# Patient Record
Sex: Female | Born: 1980 | Race: White | Hispanic: No | Marital: Married | Smoking: Former smoker
Health system: Southern US, Community
[De-identification: ages and names within clinical notes are randomized; demographics above are authoritative.]

## PROBLEM LIST (undated history)

## (undated) DIAGNOSIS — G4730 Sleep apnea, unspecified: Secondary | ICD-10-CM

## (undated) DIAGNOSIS — G4700 Insomnia, unspecified: Secondary | ICD-10-CM

## (undated) DIAGNOSIS — L709 Acne, unspecified: Secondary | ICD-10-CM

## (undated) DIAGNOSIS — K5909 Other constipation: Secondary | ICD-10-CM

## (undated) DIAGNOSIS — J189 Pneumonia, unspecified organism: Secondary | ICD-10-CM

## (undated) DIAGNOSIS — R748 Abnormal levels of other serum enzymes: Secondary | ICD-10-CM

## (undated) DIAGNOSIS — E538 Deficiency of other specified B group vitamins: Secondary | ICD-10-CM

## (undated) DIAGNOSIS — G4733 Obstructive sleep apnea (adult) (pediatric): Secondary | ICD-10-CM

## (undated) DIAGNOSIS — N3281 Overactive bladder: Secondary | ICD-10-CM

## (undated) DIAGNOSIS — M25519 Pain in unspecified shoulder: Secondary | ICD-10-CM

## (undated) DIAGNOSIS — M7071 Other bursitis of hip, right hip: Secondary | ICD-10-CM

## (undated) DIAGNOSIS — G90A Postural orthostatic tachycardia syndrome (POTS): Secondary | ICD-10-CM

## (undated) DIAGNOSIS — R51 Headache: Secondary | ICD-10-CM

## (undated) DIAGNOSIS — G44009 Cluster headache syndrome, unspecified, not intractable: Secondary | ICD-10-CM

## (undated) DIAGNOSIS — K219 Gastro-esophageal reflux disease without esophagitis: Secondary | ICD-10-CM

## (undated) DIAGNOSIS — F419 Anxiety disorder, unspecified: Secondary | ICD-10-CM

## (undated) DIAGNOSIS — G43909 Migraine, unspecified, not intractable, without status migrainosus: Secondary | ICD-10-CM

## (undated) DIAGNOSIS — L309 Dermatitis, unspecified: Secondary | ICD-10-CM

## (undated) DIAGNOSIS — M25562 Pain in left knee: Secondary | ICD-10-CM

## (undated) DIAGNOSIS — K1379 Other lesions of oral mucosa: Secondary | ICD-10-CM

## (undated) DIAGNOSIS — F32A Depression, unspecified: Secondary | ICD-10-CM

## (undated) DIAGNOSIS — E669 Obesity, unspecified: Secondary | ICD-10-CM

## (undated) DIAGNOSIS — M1990 Unspecified osteoarthritis, unspecified site: Secondary | ICD-10-CM

## (undated) DIAGNOSIS — R519 Headache, unspecified: Secondary | ICD-10-CM

## (undated) DIAGNOSIS — E559 Vitamin D deficiency, unspecified: Secondary | ICD-10-CM

## (undated) HISTORY — DX: Headache: R51

## (undated) HISTORY — DX: Pain in left knee: M25.562

## (undated) HISTORY — PX: INTRAUTERINE DEVICE (IUD) INSERTION: SHX5877

## (undated) HISTORY — DX: Insomnia, unspecified: G47.00

## (undated) HISTORY — DX: Cluster headache syndrome, unspecified, not intractable: G44.009

## (undated) HISTORY — DX: Postural orthostatic tachycardia syndrome (POTS): G90.A

## (undated) HISTORY — DX: Other bursitis of hip, right hip: M70.71

## (undated) HISTORY — DX: Obstructive sleep apnea (adult) (pediatric): G47.33

## (undated) HISTORY — PX: HEMORRHOID SURGERY: SHX153

## (undated) HISTORY — DX: Pain in unspecified shoulder: M25.519

## (undated) HISTORY — PX: DIAGNOSTIC LAPAROSCOPY WITH REMOVAL OF ECTOPIC PREGNANCY: SHX6449

## (undated) HISTORY — DX: Migraine, unspecified, not intractable, without status migrainosus: G43.909

## (undated) HISTORY — PX: LASIK: SHX215

## (undated) HISTORY — DX: Overactive bladder: N32.81

## (undated) HISTORY — DX: Deficiency of other specified B group vitamins: E53.8

## (undated) HISTORY — PX: DUPUYTREN CONTRACTURE RELEASE: SHX1478

## (undated) HISTORY — PX: DILATION AND CURETTAGE OF UTERUS: SHX78

## (undated) HISTORY — DX: Headache, unspecified: R51.9

## (undated) HISTORY — PX: ECTOPIC PREGNANCY SURGERY: SHX613

## (undated) HISTORY — PX: TUBAL LIGATION: SHX77

## (undated) HISTORY — DX: Other constipation: K59.09

## (undated) HISTORY — DX: Dermatitis, unspecified: L30.9

## (undated) HISTORY — DX: Depression, unspecified: F32.A

## (undated) HISTORY — DX: Unspecified osteoarthritis, unspecified site: M19.90

## (undated) HISTORY — DX: Acne, unspecified: L70.9

## (undated) HISTORY — DX: Obesity, unspecified: E66.9

## (undated) HISTORY — DX: Other lesions of oral mucosa: K13.79

## (undated) HISTORY — DX: Vitamin D deficiency, unspecified: E55.9

---

## 2000-11-07 HISTORY — PX: GALLBLADDER SURGERY: SHX652

## 2001-11-07 HISTORY — PX: CHOLECYSTECTOMY: SHX55

## 2004-08-16 ENCOUNTER — Inpatient Hospital Stay: Payer: Self-pay

## 2005-03-02 ENCOUNTER — Ambulatory Visit: Payer: Self-pay | Admitting: Internal Medicine

## 2006-09-12 ENCOUNTER — Ambulatory Visit: Payer: Self-pay | Admitting: Surgery

## 2006-12-14 ENCOUNTER — Ambulatory Visit: Payer: Self-pay | Admitting: Internal Medicine

## 2006-12-15 ENCOUNTER — Ambulatory Visit: Payer: Self-pay | Admitting: Internal Medicine

## 2007-07-16 ENCOUNTER — Inpatient Hospital Stay: Payer: Self-pay | Admitting: Vascular Surgery

## 2008-06-04 ENCOUNTER — Ambulatory Visit: Payer: Self-pay | Admitting: Obstetrics and Gynecology

## 2008-06-10 ENCOUNTER — Ambulatory Visit: Payer: Self-pay | Admitting: Obstetrics and Gynecology

## 2008-07-04 ENCOUNTER — Ambulatory Visit: Payer: Self-pay | Admitting: Obstetrics and Gynecology

## 2008-07-09 DIAGNOSIS — Z720 Tobacco use: Secondary | ICD-10-CM | POA: Insufficient documentation

## 2008-11-12 DIAGNOSIS — F419 Anxiety disorder, unspecified: Secondary | ICD-10-CM

## 2008-11-12 DIAGNOSIS — G4700 Insomnia, unspecified: Secondary | ICD-10-CM

## 2009-07-24 ENCOUNTER — Ambulatory Visit: Payer: Self-pay | Admitting: General Surgery

## 2009-07-31 ENCOUNTER — Ambulatory Visit: Payer: Self-pay | Admitting: General Surgery

## 2009-10-17 ENCOUNTER — Inpatient Hospital Stay: Payer: Self-pay | Admitting: Psychiatry

## 2009-11-25 ENCOUNTER — Ambulatory Visit: Payer: Self-pay | Admitting: Unknown Physician Specialty

## 2009-11-26 ENCOUNTER — Ambulatory Visit: Payer: Self-pay | Admitting: Unknown Physician Specialty

## 2011-04-18 ENCOUNTER — Ambulatory Visit: Payer: Self-pay | Admitting: Family Medicine

## 2013-04-02 DIAGNOSIS — Z86018 Personal history of other benign neoplasm: Secondary | ICD-10-CM

## 2013-04-02 HISTORY — DX: Personal history of other benign neoplasm: Z86.018

## 2013-06-17 ENCOUNTER — Ambulatory Visit: Payer: Self-pay | Admitting: Obstetrics & Gynecology

## 2013-06-17 LAB — CBC
MCH: 29.7 pg (ref 26.0–34.0)
MCHC: 34.6 g/dL (ref 32.0–36.0)
WBC: 8.3 10*3/uL (ref 3.6–11.0)

## 2013-06-17 LAB — HCG, QUANTITATIVE, PREGNANCY: Beta Hcg, Quant.: 1025 m[IU]/mL — ABNORMAL HIGH

## 2013-06-19 LAB — PATHOLOGY REPORT

## 2013-08-09 ENCOUNTER — Ambulatory Visit: Payer: Self-pay | Admitting: Specialist

## 2013-09-20 ENCOUNTER — Ambulatory Visit: Payer: Self-pay | Admitting: Obstetrics and Gynecology

## 2013-11-07 ENCOUNTER — Other Ambulatory Visit: Payer: Self-pay | Admitting: Obstetrics and Gynecology

## 2013-11-07 LAB — HCG, QUANTITATIVE, PREGNANCY: Beta Hcg, Quant.: 319 m[IU]/mL — ABNORMAL HIGH

## 2013-11-21 ENCOUNTER — Encounter: Payer: Self-pay | Admitting: Maternal & Fetal Medicine

## 2013-12-02 ENCOUNTER — Encounter: Payer: Self-pay | Admitting: Maternal & Fetal Medicine

## 2013-12-02 LAB — COMPREHENSIVE METABOLIC PANEL
ANION GAP: 5 — AB (ref 7–16)
AST: 21 U/L (ref 15–37)
Albumin: 3.4 g/dL (ref 3.4–5.0)
Alkaline Phosphatase: 90 U/L
BILIRUBIN TOTAL: 0.3 mg/dL (ref 0.2–1.0)
BUN: 7 mg/dL (ref 7–18)
CO2: 24 mmol/L (ref 21–32)
Calcium, Total: 8.8 mg/dL (ref 8.5–10.1)
Chloride: 107 mmol/L (ref 98–107)
Creatinine: 0.57 mg/dL — ABNORMAL LOW (ref 0.60–1.30)
EGFR (African American): >60
EGFR (Non-African Amer.): >60
Glucose: 80 mg/dL (ref 65–99)
OSMOLALITY: 269 (ref 275–301)
POTASSIUM: 3.5 mmol/L (ref 3.5–5.1)
SGPT (ALT): 49 U/L (ref 12–78)
Sodium: 136 mmol/L (ref 136–145)
Total Protein: 7.5 g/dL (ref 6.4–8.2)

## 2013-12-02 LAB — URINALYSIS, COMPLETE
BACTERIA: NONE SEEN
Bilirubin,UR: NEGATIVE
Blood: NEGATIVE
Glucose,UR: NEGATIVE mg/dL (ref 0–75)
LEUKOCYTE ESTERASE: NEGATIVE
Nitrite: NEGATIVE
Ph: 6 (ref 4.5–8.0)
Protein: NEGATIVE
Specific Gravity: 1.009 (ref 1.003–1.030)
WBC UR: 1 /HPF (ref 0–5)

## 2013-12-02 LAB — T4, FREE: Free Thyroxine: 1.41 ng/dL (ref 0.76–1.46)

## 2013-12-02 LAB — TSH: Thyroid Stimulating Horm: 0.24 u[IU]/mL — ABNORMAL LOW

## 2013-12-02 LAB — MAGNESIUM: Magnesium: 1.9 mg/dL

## 2013-12-02 LAB — HCG, QUANTITATIVE, PREGNANCY: Beta Hcg, Quant.: 170854 m[IU]/mL — ABNORMAL HIGH

## 2013-12-03 ENCOUNTER — Inpatient Hospital Stay: Payer: Self-pay | Admitting: Obstetrics & Gynecology

## 2013-12-08 ENCOUNTER — Encounter: Payer: Self-pay | Admitting: Maternal & Fetal Medicine

## 2014-02-04 DIAGNOSIS — M659 Synovitis and tenosynovitis, unspecified: Secondary | ICD-10-CM | POA: Insufficient documentation

## 2014-02-04 DIAGNOSIS — Z8669 Personal history of other diseases of the nervous system and sense organs: Secondary | ICD-10-CM | POA: Insufficient documentation

## 2014-02-11 ENCOUNTER — Observation Stay: Payer: Self-pay

## 2014-02-11 LAB — URINALYSIS, COMPLETE
BLOOD: NEGATIVE
Bacteria: NONE SEEN
Bilirubin,UR: NEGATIVE
Glucose,UR: 50 mg/dL (ref 0–75)
KETONE: NEGATIVE
LEUKOCYTE ESTERASE: NEGATIVE
NITRITE: NEGATIVE
PH: 5 (ref 4.5–8.0)
Protein: NEGATIVE
RBC,UR: 2 /HPF (ref 0–5)
Specific Gravity: 1.026 (ref 1.003–1.030)
WBC UR: 2 /HPF (ref 0–5)

## 2014-03-20 ENCOUNTER — Encounter: Payer: Self-pay | Admitting: Obstetrics and Gynecology

## 2014-05-26 ENCOUNTER — Observation Stay: Payer: Self-pay

## 2014-05-26 ENCOUNTER — Observation Stay: Payer: Self-pay | Admitting: Obstetrics and Gynecology

## 2014-05-26 LAB — BASIC METABOLIC PANEL
Anion Gap: 12 (ref 7–16)
BUN: 5 mg/dL — ABNORMAL LOW (ref 7–18)
CO2: 20 mmol/L — AB (ref 21–32)
CREATININE: 0.61 mg/dL (ref 0.60–1.30)
Calcium, Total: 8.4 mg/dL — ABNORMAL LOW (ref 8.5–10.1)
Chloride: 105 mmol/L (ref 98–107)
EGFR (African American): >60
EGFR (Non-African Amer.): >60
GLUCOSE: 131 mg/dL — AB (ref 65–99)
Osmolality: 273 (ref 275–301)
Potassium: 3.3 mmol/L — ABNORMAL LOW (ref 3.5–5.1)
SODIUM: 137 mmol/L (ref 136–145)

## 2014-05-26 LAB — CBC WITH DIFFERENTIAL/PLATELET
HCT: 31.8 % — ABNORMAL LOW (ref 35.0–47.0)
HGB: 10.4 g/dL — ABNORMAL LOW (ref 12.0–16.0)
Lymphocytes: 15 %
MCH: 28.4 pg (ref 26.0–34.0)
MCHC: 32.8 g/dL (ref 32.0–36.0)
MCV: 87 fL (ref 80–100)
Monocytes: 2 %
Platelet: 197 10*3/uL (ref 150–440)
RBC: 3.67 10*6/uL — ABNORMAL LOW (ref 3.80–5.20)
RDW: 18.2 % — ABNORMAL HIGH (ref 11.5–14.5)
SEGMENTED NEUTROPHILS: 83 %
WBC: 8.5 10*3/uL (ref 3.6–11.0)

## 2014-05-26 LAB — URINALYSIS, COMPLETE
BILIRUBIN, UR: NEGATIVE
Blood: NEGATIVE
GLUCOSE, UR: NEGATIVE mg/dL (ref 0–75)
Leukocyte Esterase: NEGATIVE
NITRITE: NEGATIVE
Ph: 6 (ref 4.5–8.0)
SPECIFIC GRAVITY: 1.024 (ref 1.003–1.030)
Squamous Epithelial: 4
WBC UR: 6 /HPF (ref 0–5)

## 2014-05-26 LAB — MAGNESIUM: MAGNESIUM: 1.5 mg/dL — AB

## 2014-05-29 DIAGNOSIS — O26893 Other specified pregnancy related conditions, third trimester: Secondary | ICD-10-CM

## 2014-05-29 DIAGNOSIS — O43129 Velamentous insertion of umbilical cord, unspecified trimester: Secondary | ICD-10-CM | POA: Insufficient documentation

## 2014-05-29 DIAGNOSIS — O99019 Anemia complicating pregnancy, unspecified trimester: Secondary | ICD-10-CM | POA: Insufficient documentation

## 2014-05-29 DIAGNOSIS — Z2913 Encounter for prophylactic Rho(D) immune globulin: Secondary | ICD-10-CM | POA: Insufficient documentation

## 2014-05-29 DIAGNOSIS — O30109 Triplet pregnancy, unspecified number of placenta and unspecified number of amniotic sacs, unspecified trimester: Secondary | ICD-10-CM | POA: Insufficient documentation

## 2014-05-29 DIAGNOSIS — N1330 Unspecified hydronephrosis: Secondary | ICD-10-CM | POA: Insufficient documentation

## 2014-05-29 DIAGNOSIS — Z6791 Unspecified blood type, Rh negative: Secondary | ICD-10-CM | POA: Insufficient documentation

## 2014-05-29 DIAGNOSIS — O0910 Supervision of pregnancy with history of ectopic or molar pregnancy, unspecified trimester: Secondary | ICD-10-CM | POA: Insufficient documentation

## 2014-05-30 DIAGNOSIS — O30109 Triplet pregnancy, unspecified number of placenta and unspecified number of amniotic sacs, unspecified trimester: Secondary | ICD-10-CM | POA: Insufficient documentation

## 2014-09-10 DIAGNOSIS — N971 Female infertility of tubal origin: Secondary | ICD-10-CM | POA: Insufficient documentation

## 2015-02-27 NOTE — Op Note (Signed)
PATIENT NAME:  Martha Page, Martha Page MR#:  [id number] DATE OF BIRTH:  05-28-1981  DATE OF PROCEDURE:  06/17/2013  PREOPERATIVE DIAGNOSIS: Right ectopic pregnancy.  POSTOPERATIVE DIAGNOSIS: Right ectopic pregnancy.  PROCEDURE: Operative laparoscopy with right salpingectomy, lysis of adhesions and D and C.   SURGEON: R. Barnett Applebaum, MD  ESTIMATED BLOOD LOSS: Minimal.  COMPLICATIONS: None.  FINDINGS: The patient had a right ectopic pregnancy with a tube beyond repair. The patient had adhesions throughout the pelvis. The patient had a mild hydrosalpinx on the left side. The uterus was normal in lie.   SPECIMEN: Endometrial curettings and right tube.   DISPOSITION: To recovery room in stable condition.  TECHNIQUE: The patient is prepped and draped in usual sterile fashion after adequate anesthesia is obtained in the dorsal lithotomy position. The bladder is drained with a catheter and a speculum is placed, and a tenaculum is placed on the anterior lip of the  cervix.   Attention is then turned to the abdomen where a Veress needle is inserted through a 5 mm infraumbilical incision after Marcaine is used to anesthetize the skin. Veress needle placement is confirmed using the hanging drop technique and the abdomen is then insufflated with CO2 gas. A 5 mm trocar is then inserted under direct vision with the laparoscope, with no injuries or bleeding noted. The patient is placed in Trendelenburg position and the above-mentioned findings are visualized.   A 5 mm trocar is placed in the left lower quadrant lateral to the epigastric blood vessels and an 11 mm trocar is placed in the suprapubic region for laparoscopic instrumentation purposes. The right fallopian tube is isolated and dissected of adhesions. The fallopian tube is then dissected with the 5 mm Harmonic scalpel to completely amputate the right fallopian tube, with preservation of the right ovary and the right ovary's blood supply. The tube is then  placed in an Endopouch and removed through the suprapubic trocar without complication or difficulty. The pelvic cavity is then irrigated with aspiration of fluid. Additional filmy adhesions in the posterior cul-de-sac are removed by surgical technique. There is no bleeding at this time. The left fallopian tube is noted to have some mild hydrosalpinx, but there are no significant adhesions on this side.   Gas is expelled and trocars are removed. Skin is closed with Dermabond.   Attention is then turned to the D and C part of the procedure. The cervix is gently dilated to size 20 Pratt dilator and then a banjo curette is used to obtain gentle curettings from the endometrial lining. Excellent hemostasis is noted from this area. Tenaculum is removed without bleeding. The patient is cleansed of all blood and Betadine. The patient toes to recovery room in stable condition. All sponge, instrument and needle counts are correct.    ____________________________ R. Barnett Applebaum, MD rph:jm D: 06/17/2013 15:13:31 ET T: 06/17/2013 15:32:51 ET JOB#: [id number]  cc: Glean Salen, MD, <Dictator> Gae Dry MD ELECTRONICALLY SIGNED 06/18/2013 8:21

## 2015-02-28 NOTE — Consult Note (Signed)
Referral Information:  Reason for Referral 34 yo gravida 5 para 2022 at [redacted]w[redacted]d gestation by dates and ultrasound who is referred by Dr. Star Age regarding the patient's triplet pregnancy.  The patient had an ultrasound today at [facility] which revealed 3 separate sacs and 3 separate embryos.  The middle embryo had a heart rate which could be detected.  The sonographer could not be sure of the other two.  This pregnancy was conceived during the patient's first cycle of ART with Femara and Ovidrel. She was a patient of Dr. Binnie Rail at [facility] in [place].   Referring Physician Dr. Star Age   Prenatal Hx The patient has been started on 4 mg of Folate.  Has had one episode of brief, pink spotting.  Is Rh negative.  Did not receive Rh immune globulin.  Has had her flu shot.   Past Obstetrical Hx 1. 05/14/2001 - spontaneous vaginal delivery of 7 lb female at [redacted] weeks gestation 2. 06/27/2003 - anembryonic gestation detected at 12 weeks.  Underwent D & C 3. 08/17/2004 - spontaneous vaginal delivery of 6 lb female at [redacted] weeks gestation.  The patient had preterm contractions at 35 weeks and was tocolyzed.   4. 06/2013 - ectopic pregnancy - R laparascopic salpingectomy   Home Medications: Medication Instructions Status  Diclegis 10 mg-10 mg oral delayed release tablet 1 tab(s) orally 3 times a day, As Needed - for Nausea, Vomiting Active  Effexor XR 75 mg oral capsule, extended release 1 cap(s) orally once a day Active  Claritin 10 mg oral tablet 1 tab(s) orally once a day Active  Prenatal Multivitamins Prenatal Multivitamins oral tablet 1 tab(s) orally once a day Active  biotin once orally once a day Active  folic acid 4 milligram(s) orally once a day Active   Allergies:   amoxil: Rash  Codeine: Rash  Septra: GI Distress, Other  Penicillin: Rash  Doxycycline: GI Distress  Sulfa drugs: GI Distress  Vital Signs/Notes:  Nursing Vital Signs: **Vital Signs.:   15-Jan-15 13:27  Temperature  Temperature (F) 97.7  Pulse Pulse 82  Respirations Respirations 18  Systolic BP Systolic BP 833  Diastolic BP (mmHg) Diastolic BP (mmHg) 60  Pulse Ox % Pulse Ox % 100   Perinatal Consult:  LMP 12-Oct-2013   PGyn Hx Infertility; 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   Past Medical History cont'd One, very brief psychiatric hospitalization after the disintegration of her previous marriage   PSurg Hx D & C 2004, 2014; laparoscopic R salpingectomy 2014, laparscopic IUD removal after perforation; carapl tunnel surgery; cholecystectomy; hemorrhoidectomy   FHx MI, Father - CAD, MI; Mother - storke age 7, DM; MGM - DM; MGM - DM; PGM - HTN, CAD, lung ca; MGF - died prostate ca   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 Triplet pregnancy 2. AMA - Will be 35 at delivery 3. History of PTL without PTD  4. Increased risk of PTL (average 34 weeks with triplets), preeclampsia (50% risk with triplets), gestational diabetes, thombosis and other medical and obstetrical complications of pregnancy   Recommendations 1. Triplets: --The patient was scheduled to return to [facility] in [redacted] weeks gestation for a viability ultrasound --If the patient has viable triplets at that time, we will schedule her for 2 weeks after that for consultation in our [place] at [facility] to discuss the option of selective reduction. --The patient should start 81 mg of ASA  at [redacted] weeks gestation.   2. AMA --At the time of the appointment at [facility], the patient can meet with one of our genetic counselors there to talk about options for prenatal diagnosis and selection of embryo(s) for reduction. 3. History of PTL without PTD  -- She doesn't meet criteria for 17OHP prophylaxis, which has not been shown to reduce the risk of PTL in multiples in the absence of a history of PTD, however, we would consider empiric rx if patient had a strong preference 4.  The patient was counseled abut the increased risk of medical and obstetrical complications in pregnancy.   Plan:  Genetic Counseling yes, In 4 weeks   Prenatal Diagnosis Options First trimester, Level II [place]   Ultrasound at what gestational ages Monthly >24 weeks   Antepartum Testing Twice weekly, Starting at 30 weeks   Delivery Mode Cesarean, for triplets   Delivery at what gestational age 33-37 weeks for triplets    Total Time Spent with Patient 45 minutes   Office Use Only 99243  Level 3 (31min) NEW office consult detailed   Coding Description: MULTIPLE GESTATION INDICATION(S).   Triplet gestation.  Electronic Signatures: Dellia Nims (MD)  (Signed 15-Jan-15 17:37)  Authored: Referral, Home Medications, Allergies, Vital Signs/Notes, Consult, Exam, Impression, Plan, Billing, Coding Description   Last Updated: 15-Jan-15 17:37 by Dellia Nims (MD)

## 2015-02-28 NOTE — Consult Note (Signed)
Referral Information:  Reason for Referral 34 yo WF g5 P202(Sab+ect)3 (2 natural born + 1 adopted)  now at 70 weeks with Triplets !   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 She had a h/o infertility and took Clomid last August - she had an ectopic and required removal of her left tube . She underwent a cycle of ovidrel and femara at Dr Binnie Rail  and conceived tri amniotic /trichorionic triplets. She was seen at [facility] and counseled regarding options for reduction - pt elected to continue . All 3 fetuses have looked normal (all female) C has mild pelvicaliectasis, A has a velamentous cord insertion. Pt has undergone serial u/s of her cervix which have been inthe 4 cm range . She had 3 presyncopal episodes at [facility] and is now out of work resting at home. She reports feeling the fetuses move separately. She has occasional braxton hicks - no bleeding . She is on pelvic rest.   Past Obstetrical Hx 34 yo female adopted 2002 34 yo female 39+ weeks 7 lbs  2004 D&C for early sab  2005 34 yo female 37 weeks 6+ lbs - some preterm labor at 11 weeks  06/2013 ectopic after Clomid   Home Medications: Medication Instructions Status  Effexor XR 75 mg oral capsule, extended release 1 cap(s) orally once a day Active  magnesium 1 tab(s) orally once a day Active  Claritin 10 mg oral tablet 1 tab(s) orally once a day Active  omeprazole 20 mg oral delayed release capsule 1 cap(s) orally once a day Active  Prenatal Multivitamins Prenatal Multivitamins oral tablet 1 tab(s) orally once a day Active   Allergies:   amoxil: Rash  Codeine: Rash  Septra: GI Distress, Other  Penicillin: Rash  Doxycycline: GI Distress  Sulfa drugs: GI Distress  Vital Signs/Notes:  Nursing Vital Signs: **Vital Signs.:   14-May-15 09:03  Vital Signs Type Routine  Temperature Temperature (F) 97  Celsius 36.1   Temperature Source oral  Pulse Pulse 113  Respirations Respirations 18  Systolic BP Systolic BP 283  Diastolic BP (mmHg) Diastolic BP (mmHg) 63  Mean BP 77  Pulse Ox % Pulse Ox % 99  Pulse Ox Activity Level  At rest  Oxygen Delivery Room Air/ 21 %   Perinatal Consult:  LMP 12-Oct-2013   PGyn Hx infertility   Past Medical History cont'd gall bladder removal wrist surgery  laporoscopic IUD removal after perforation  laporoscopic salpingectomy  hemorrhoidectomy x 2 anxiety  on effexor- does have h/o decompensatin with end of last marriage - denies PP Depression Migraineur - now improved uses oral mag   PSurg Hx see above   Occupation Mother NA at [employer]   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 prematurity -I reviewed with pt that average GA for delivery with triplets is 32 -33 weeks primarily due to premature labor with half coming before 33 weeks. We reviewed that no prophylactic strategy has been found for prolonging triplet gestation - not cerclage, not tocolytics, not 17 P or vaginal progesterone . Pessaries are  under investigation.  I reviewed increaed morbidity and mortality if delivery  before 28 weeks and much improved outcome after 32 weeks when longterm morbidity form prematurity is decreased. 2 FGR I reviewed increased risk for FGR  upt o 50% with multiples and possibility that if a triplet appeared severely compromised , challenges in deciding when to intervene.   3. Maternal risks increased risk of hyeremesis, gest DM , preeclampsia (not on aspirin)  , PPH (pt will accept transfusion) 4 cesaeran delivery 5  advanced maternal age  50 rh neg 7 desires BTL if fetuses are at a viable GA .  8 PCN allergy - I did not ask if tolerates cephalosporins 9 Insomnia - suggested antihistamines - like unisom 10 trip C Fetal pelvicaliectasis - f/u scan after delivery 11 trip A velamentous cord insertion   Recommendations 1 - agree with q 2week ultrasound of cervix until 29-30 weeks if shortens <2.5 cm , admission for toco monitoring and BTM. 2 REcommend q 3 week scan for growth in the third trimester.  3 BTM if any signs of premature labor 4 Rhogam for bleeding , 28 weeks and pp as indicated 5 Antenatal testing as indicated if  FGR otherwise consider twice weekly at 32 weeks given increase risk for IUFD with triplets 6  Cesarean at 13 weeks if remains pregnant  If early preterm labor then pt will come to [facility] , if later, reasonable to consider [facility] if neomatology and pt are  comfortable   Plan:  Genetic Counseling done   Ultrasound at what gestational ages q 3 weeks in thrid trimester   Antepartum Testing Twice weekly, Starting at 41 weeks   Delivery Mode Cesarean   Delivery at what gestational age 36 w    Total Time Spent with Patient 15 minutes   >50% of visit spent in couseling/coordination of care yes   Office Use Only [id number]  Office Visit Level 3 (10min) EST exp prob focused outpt   Coding Description: MULTIPLE GESTATION INDICATION(S).   Triplet gestation.  Electronic Signatures: Sharyn Creamer (MD)  (Signed 14-May-15 12:39)  Authored: Referral, Home Medications, Allergies, Vital Signs/Notes, Consult, Exam, Impression, Plan, Billing, Coding Description   Last Updated: 14-May-15 12:39 by Sharyn Creamer (MD)

## 2015-03-17 NOTE — H&P (Signed)
L&D Evaluation:  History:  HPI 34 year old G5 P2022 currently 32 2/[redacted] weeks gestation with triplets was sent from office for low blood pressure, leg cramps, and hx of nausea and vomiting/ sweating yesterday for hydration and evaluation for posssible electrolyte imbalance. Has been able to eat today. FSBS in office was 131,  one hour postparandially. Hx of infertility and conceived triamniotic/trichorionic triplets after a cycle of ovidrel and femara. All three fetuses have grown normally. Had a reactive NST x3 this AM. She was seen in [facility] for contractions in the last few days and was 1cm. Has irregular mild contractions. No VB or LOF.   Presents with not feeling well/leg cramping/N&V with ?fever yesterday and sweating.   Patient's Medical History Infertility, anxiety, migraines   Patient's Surgical History Cholecystectomy, laparoscopic IUD removal after perforation, laparoscopic salpingectomy for ectopic, hemorrhoidectomy x2   Medications Iron  Effexor XR 75 mgm daily, Claritin 10 mgm daily, omeprazole 20 mgm daily   Allergies PCN, and amoxicillin cause a rash, codeine (rash), septra and doxycycline ( GI distress)   Social History married   Family History Non-Contributory   ROS:  ROS see HPI   Exam:  Vital Signs mild tachycardia (AP=116)   General no apparent distress   Mental Status clear   Chest clear   Heart no murmur/gallop/rubs, mild tachycardia   Abdomen gravid, non-tender   Edema no edema   Reflexes 1+   Pelvic no external lesions, 1.5/75%/-2   Mebranes Intact   FHT TWIN A:140, Twin B: 132, Twin C 136   Ucx irregular, per pt history   Impression:  Impression Triplet gestation at 42 2/7 weeks with leg cramps/nausea &vomiting yesterday/?fever yesterday.   Plan:  Plan IV hydration to incude a banana bag. CMP, UA, CBC, magnesium level. Diet as tolerated.   Electronic Signatures for Addendum Section:  Karene Fry (CNM) (Signed Addendum 20-Jul-15  22:29)  K=3.3, magnesium 1.5. WBC=8.5K and H&H=10.4&31.8%. glucose=131. BUN/cr=5/0.61, CA++=8.4.  UA: 1.024, 2+ketones, +1 protein A: ketonuria with mild hypokalemia and hypomagnesemia. P: Regular diet, IV hydration, banana bag had magnesium in it, will add po KCL.   Electronic Signatures: Karene Fry (CNM)  (Signed 20-Jul-15 22:23)  Authored: L&D Evaluation   Last Updated: 20-Jul-15 22:29 by Karene Fry (CNM)

## 2015-03-17 NOTE — H&P (Signed)
L&D Evaluation:  History Expanded:  HPI 34 yo [place], EDD of 07/19/14,  triplet pregnancy at 7w 2d. Admission for hyperemesis. Pt reports being unable to tolerate po intake outside of a few bites of food or sips of beverages, vomited several times over the past 2 days. Pt was seen at [facility] this am, +FHT's on all three fetuses.   Blood Type (Maternal) O negative   Group B Strep Results Maternal (Result >5wks must be treated as unknown) unknown/result > 5 weeks ago   Maternal HIV Negative   Maternal Syphilis Ab Nonreactive   Maternal Varicella Immune   Rubella Results (Maternal) immune   Presents with nausea/vomiting   Patient's Medical History Anxiety   Patient's Surgical History Colecystectomy  D&C  lap - ectopic removal, Lasik   Medications Pre Natal Vitamins  Effexor, Diclegis, Phenergan, Claritin, Folic Acid   Allergies PCN, Sulfa, Codeine, amoxil, doxycycline   Social History none   Exam:  Vital Signs stable   General no apparent distress   Mental Status clear   Chest clear   Heart no murmur/gallop/rubs   Abdomen mildly distended, hypoactive bowel sounds   Edema no edema   Impression:  Impression hyperemesis   Plan:  Comments UA, CMP, magnesium IV fluids, meds per Dr Danielle Rankin orders   Electronic Signatures: Ander Purpura (CNM)  (Signed 26-Jan-15 10:26)  Authored: L&D Evaluation   Last Updated: 26-Jan-15 10:26 by Ander Purpura (CNM)

## 2015-03-17 NOTE — H&P (Signed)
L&D Evaluation:  History Expanded:  HPI 34 yo [place], EDD of 07/19/14,  triplet pregnancy at 7w 2d. Admission for hyperemesis. Pt reports being unable to tolerate po intake outside of a few bites of food or sips of beverages, vomited several times over the past 2 days. Pt was seen at [facility] this am, +FHT's on all three fetuses.   Gravida 5   Term 2   PreTerm 0   Abortion 2   Living 2   Blood Type (Maternal) O negative   Group B Strep Results Maternal (Result >5wks must be treated as unknown) unknown/result > 5 weeks ago   Maternal HIV Negative   Maternal Syphilis Ab Nonreactive   Maternal Varicella Immune   Rubella Results (Maternal) immune   Maternal T-Dap Unknown   [facility] 19-Jul-2014   Presents with nausea/vomiting, loiw pelvic pain and tugging   Patient's Medical History Anxiety   Patient's Surgical History Colecystectomy  D&C  lap - ectopic removal, Lasik   Medications Pre Natal Vitamins  Effexor, Diclegis, Phenergan, Claritin, Folic Acid   Allergies PCN, Sulfa, Codeine, amoxil, doxycycline   Social History none   Exam:  Vital Signs stable   General no apparent distress   Mental Status clear   Chest clear   Heart no murmur/gallop/rubs   Abdomen mildly distended, hypoactive bowel sounds   Edema no edema   Impression:  Impression hyperemesis   Plan:  Comments UA, CMP, magnesium IV fluids, meds per Dr Danielle Rankin orders   Electronic Signatures: Erik Obey (MD)  (Signed 07-Apr-15 11:38)  Authored: L&D Evaluation   Last Updated: 07-Apr-15 11:38 by Erik Obey (MD)

## 2015-04-02 ENCOUNTER — Encounter: Payer: Self-pay | Admitting: Primary Care

## 2015-04-02 ENCOUNTER — Ambulatory Visit (INDEPENDENT_AMBULATORY_CARE_PROVIDER_SITE_OTHER): Payer: Managed Care, Other (non HMO) | Admitting: Primary Care

## 2015-04-02 VITALS — BP 106/70 | HR 100 | Temp 98.4°F | Ht 66.0 in | Wt 166.8 lb

## 2015-04-02 DIAGNOSIS — F419 Anxiety disorder, unspecified: Secondary | ICD-10-CM | POA: Insufficient documentation

## 2015-04-02 DIAGNOSIS — F411 Generalized anxiety disorder: Secondary | ICD-10-CM | POA: Diagnosis not present

## 2015-04-02 DIAGNOSIS — R1084 Generalized abdominal pain: Secondary | ICD-10-CM | POA: Diagnosis not present

## 2015-04-02 DIAGNOSIS — G44009 Cluster headache syndrome, unspecified, not intractable: Secondary | ICD-10-CM | POA: Insufficient documentation

## 2015-04-02 DIAGNOSIS — G44029 Chronic cluster headache, not intractable: Secondary | ICD-10-CM

## 2015-04-02 NOTE — Assessment & Plan Note (Signed)
Acute attacks of generalized pain which are sharp causing diarrhea and diaphoresis. 5 episodes total over the past several months. ALT 120's and Alk Phos 170 per [facility] records on 03/25/15. No RUQ pain. Gall bladder removed in 2002). Tender to LLQ and suprapubic region on exam. CMP today. CT scan with contrast tomorrow. Will closely monitor.

## 2015-04-02 NOTE — Assessment & Plan Note (Signed)
Managed on Effexor XR 150 daily. Feels well managed.

## 2015-04-02 NOTE — Patient Instructions (Signed)
Stop by the front and ask to speak with [facility] regarding your CT scan. Once I receive the results I will call you. Complete lab work prior to leaving today. I will notify you of your results. Eat a bland diet for the next several weeks. No fried/fatty foods. Start omeprazole 20 mg daily which may be purchased over the counter. It was a pleasure to meet you today! Please don't hesitate to call me with any questions. Welcome to [facility]!

## 2015-04-02 NOTE — Assessment & Plan Note (Signed)
Present infrequently to right side. +photophobia, phonophobia, nausea during episodes. Some minor headaches which are tolerable.

## 2015-04-02 NOTE — Progress Notes (Signed)
Subjective:    Patient ID: Martha Page, female    DOB: [birth year], 34 y.o.   MRN: [id number]  HPI  [name] is a 34 year old female who presents today to establish care and discuss the problems mentioned below. Will obtain old records. Reviewed recent records from [place].  1) Anxiety: Present since 2008 since separation from ex-husband. She's managed on Effexor XR 150 mg for the past 2 years and was placed on by Dr. Star Age. Recent increase in dose less than 1 year ago due to night sweats and increased anxiety.   2) Joint pain: Evaluated by Dr. Jefm Bryant with Rheumatology and determined not to have rheumatoid arthritis. She was provided with a RX for Mobic. Dr. Sabra Heck  (ortho) ordered arthritis panel that resulted with "nonspecific inflammation". She also reports an increase in bruising to her legs. Iron panel was completed at [facility] which was normal.  3) Abdominal pain: She's had 5-6 episodes where she'll experience abdominal cramping with diarrhea, and near syncope. During episodes the pain will take her breath away and will be present to her abdomen and is generalized. She also reports oral ulcers that have been present for months. She does not consume acidic foods such as tomatoes and juices but will still get despite her limitations.  She was evaluated by rheumatology and was told to have elevated liver enzymes. Her Alk phos 170 (gall bladder was removed in 2002), and ALT 120's. Denies abdominal pain except during episodes.   4) Shoulder pain: Left shoulder pain for the past couple months. Seeing Dr. Sabra Heck with ortho who provided oral steroids which provided relief temporarily. She has also had steroid injections with some help.  5) Migraines/Cluster headache: Present for years and occur on the right side behind eye. She will get photophobia, phonophobia, and nausea. She will take Naproxen with occasional help. Migraines occur once every couple of months. She will get mild  headaches occasionally but are tolerable.   Review of Systems  Constitutional: Negative for unexpected weight change.  HENT: Negative for rhinorrhea.   Respiratory: Negative for cough and shortness of breath.   Cardiovascular: Negative for chest pain.  Gastrointestinal: Positive for nausea, abdominal pain and diarrhea. Negative for constipation and blood in stool.       See HPI  Genitourinary: Negative for dysuria and frequency.  Musculoskeletal: Positive for arthralgias.  Skin: Negative for rash.  Neurological: Negative for dizziness and headaches.  Psychiatric/Behavioral:       See HPI       Past Medical History  Diagnosis Date  . Frequent headaches   . Migraine   . Shoulder pain     History   Social History  . Marital Status: Single    Spouse Name: N/A  . Number of Children: N/A  . Years of Education: N/A   Occupational History  . Not on file.   Social History Main Topics  . Smoking status: Never Smoker   . Smokeless tobacco: Not on file  . Alcohol Use: No  . Drug Use: Not on file  . Sexual Activity: Not on file   Other Topics Concern  . Not on file   Social History Narrative   Married.   6 children.   Works as a Agricultural engineer.          Past Surgical History  Procedure Laterality Date  . Gallbladder surgery  2002  . Tubal ligation    . Cesarean section      Family  History  Problem Relation Age of Onset  . Stroke Mother   . Heart disease Father   . Arthritis Paternal Grandmother   . Cancer Paternal Grandmother     lung  . Heart disease Paternal Grandmother   . Stroke Paternal Grandmother   . Hypertension Paternal Grandmother     Allergies  Allergen Reactions  . Cefazolin Rash and Shortness Of Breath  . Amoxicillin Other (See Comments)  . Codeine   . Codeine Sulfate Itching  . Doxycycline Other (See Comments)    "stomach upset" per pt records  . Sulfa Antibiotics Other (See Comments)  . Penicillin V Potassium Rash    No current  outpatient prescriptions on file prior to visit.   No current facility-administered medications on file prior to visit.    BP 106/70 mmHg  Pulse 100  Temp(Src) 98.4 F (36.9 C) (Oral)  Ht $R'5\' 6"'OV$  (1.676 m)  Wt 166 lb 12.8 oz (75.66 kg)  BMI 26.94 kg/m2  SpO2 98%    Objective:   Physical Exam  Constitutional: She is oriented to person, place, and time. She does not appear ill. No distress.  HENT:  Head: Normocephalic.  Cardiovascular: Normal rate and regular rhythm.   Pulmonary/Chest: Effort normal and breath sounds normal.  Abdominal: Soft. Bowel sounds are normal. There is tenderness in the suprapubic Page and left lower quadrant. There is no rebound and no guarding.  Neurological: She is alert and oriented to person, place, and time.  Skin: Skin is warm and dry.  Psychiatric: She has a normal mood and affect.          Assessment & Plan:

## 2015-04-03 ENCOUNTER — Ambulatory Visit (INDEPENDENT_AMBULATORY_CARE_PROVIDER_SITE_OTHER)
Admission: RE | Admit: 2015-04-03 | Discharge: 2015-04-03 | Disposition: A | Discharge status: Home / Self Care | Payer: Managed Care, Other (non HMO) | Source: Ambulatory Visit | Attending: Primary Care | Admitting: Primary Care

## 2015-04-03 ENCOUNTER — Telehealth: Payer: Self-pay | Admitting: Primary Care

## 2015-04-03 DIAGNOSIS — R748 Abnormal levels of other serum enzymes: Secondary | ICD-10-CM

## 2015-04-03 DIAGNOSIS — N858 Other specified noninflammatory disorders of uterus: Secondary | ICD-10-CM

## 2015-04-03 DIAGNOSIS — R059 Cough, unspecified: Secondary | ICD-10-CM

## 2015-04-03 DIAGNOSIS — R05 Cough: Secondary | ICD-10-CM

## 2015-04-03 DIAGNOSIS — R1084 Generalized abdominal pain: Secondary | ICD-10-CM

## 2015-04-03 LAB — COMPREHENSIVE METABOLIC PANEL
ALT: 205 U/L — ABNORMAL HIGH (ref 0–35)
AST: 116 U/L — ABNORMAL HIGH (ref 0–37)
Albumin: 4.1 g/dL (ref 3.5–5.2)
Alkaline Phosphatase: 208 U/L — ABNORMAL HIGH (ref 39–117)
BUN: 11 mg/dL (ref 6–23)
CHLORIDE: 102 meq/L (ref 96–112)
CO2: 29 meq/L (ref 19–32)
Calcium: 9.3 mg/dL (ref 8.4–10.5)
Creatinine, Ser: 0.67 mg/dL (ref 0.40–1.20)
GFR: 105.69 mL/min (ref >60.00)
Glucose, Bld: 75 mg/dL (ref 70–99)
Potassium: 4 mEq/L (ref 3.5–5.1)
SODIUM: 136 meq/L (ref 135–145)
TOTAL PROTEIN: 7.7 g/dL (ref 6.0–8.3)
Total Bilirubin: 0.4 mg/dL (ref 0.2–1.2)

## 2015-04-03 MED ORDER — BENZONATATE 200 MG PO CAPS: 200.0000 mg | ORAL_CAPSULE | Freq: Three times a day (TID) | ORAL | Status: DC | PRN

## 2015-04-03 MED ORDER — IOHEXOL 300 MG/ML  SOLN
100.0000 mL | Freq: Once | INTRAMUSCULAR | Status: AC | PRN
  Administered 2015-04-03: 100 mL via INTRAVENOUS

## 2015-04-03 NOTE — Telephone Encounter (Signed)
Notified patient of CT and lab results. Ordered pelvic and transvaginal ultrasound. Also referral made to GI for evaluation of elevated enzymes and fatty liver. Patient also reporting cough, Tessalon Pearls sent to pharmacy.

## 2015-04-07 ENCOUNTER — Ambulatory Visit
Admission: RE | Admit: 2015-04-07 | Discharge: 2015-04-07 | Disposition: A | Discharge status: Home / Self Care | Payer: Managed Care, Other (non HMO) | Source: Ambulatory Visit | Attending: Primary Care | Admitting: Primary Care

## 2015-04-07 DIAGNOSIS — N858 Other specified noninflammatory disorders of uterus: Secondary | ICD-10-CM

## 2015-04-09 ENCOUNTER — Other Ambulatory Visit: Payer: Self-pay | Admitting: Gastroenterology

## 2015-04-09 DIAGNOSIS — R748 Abnormal levels of other serum enzymes: Secondary | ICD-10-CM

## 2015-04-10 ENCOUNTER — Other Ambulatory Visit: Payer: Managed Care, Other (non HMO)

## 2015-04-14 ENCOUNTER — Encounter: Payer: Self-pay | Admitting: Emergency Medicine

## 2015-04-14 ENCOUNTER — Emergency Department
Admission: EM | Admit: 2015-04-14 | Discharge: 2015-04-14 | Disposition: A | Discharge status: Home / Self Care | Payer: Managed Care, Other (non HMO) | Attending: Emergency Medicine | Admitting: Emergency Medicine

## 2015-04-14 DIAGNOSIS — R197 Diarrhea, unspecified: Secondary | ICD-10-CM | POA: Insufficient documentation

## 2015-04-14 DIAGNOSIS — R110 Nausea: Secondary | ICD-10-CM | POA: Insufficient documentation

## 2015-04-14 DIAGNOSIS — Z880 Allergy status to penicillin: Secondary | ICD-10-CM | POA: Diagnosis not present

## 2015-04-14 DIAGNOSIS — Z3202 Encounter for pregnancy test, result negative: Secondary | ICD-10-CM | POA: Insufficient documentation

## 2015-04-14 DIAGNOSIS — R109 Unspecified abdominal pain: Secondary | ICD-10-CM | POA: Diagnosis present

## 2015-04-14 HISTORY — DX: Abnormal levels of other serum enzymes: R74.8

## 2015-04-14 LAB — POCT PREGNANCY, URINE: PREG TEST UR: NEGATIVE

## 2015-04-14 LAB — COMPREHENSIVE METABOLIC PANEL
ALT: 159 U/L — ABNORMAL HIGH (ref 14–54)
AST: 67 U/L — ABNORMAL HIGH (ref 15–41)
Albumin: 4.3 g/dL (ref 3.5–5.0)
Alkaline Phosphatase: 301 U/L — ABNORMAL HIGH (ref 38–126)
Anion gap: 8 (ref 5–15)
BUN: 13 mg/dL (ref 6–20)
CALCIUM: 8.8 mg/dL — AB (ref 8.9–10.3)
CO2: 24 mmol/L (ref 22–32)
Chloride: 107 mmol/L (ref 101–111)
Creatinine, Ser: 0.65 mg/dL (ref 0.44–1.00)
GFR calc Af Amer: >60 mL/min (ref >60)
GFR calc non Af Amer: >60 mL/min (ref >60)
GLUCOSE: 110 mg/dL — AB (ref 65–99)
Potassium: 3.4 mmol/L — ABNORMAL LOW (ref 3.5–5.1)
Sodium: 139 mmol/L (ref 135–145)
TOTAL PROTEIN: 8.6 g/dL — AB (ref 6.5–8.1)
Total Bilirubin: 0.8 mg/dL (ref 0.3–1.2)

## 2015-04-14 LAB — URINALYSIS COMPLETE WITH MICROSCOPIC (ARMC ONLY)
BILIRUBIN URINE: NEGATIVE
Glucose, UA: NEGATIVE mg/dL
Ketones, ur: NEGATIVE mg/dL
Nitrite: NEGATIVE
PROTEIN: 30 mg/dL — AB
Specific Gravity, Urine: 1.032 — ABNORMAL HIGH (ref 1.005–1.030)
pH: 6 (ref 5.0–8.0)

## 2015-04-14 LAB — CBC WITH DIFFERENTIAL/PLATELET
Basophils Absolute: 0.1 10*3/uL (ref 0–0.1)
Basophils Relative: 0 %
EOS ABS: 0.1 10*3/uL (ref 0–0.7)
Eosinophils Relative: 1 %
HCT: 42.7 % (ref 35.0–47.0)
HEMOGLOBIN: 13.8 g/dL (ref 12.0–16.0)
Lymphocytes Relative: 10 %
Lymphs Abs: 1.8 10*3/uL (ref 1.0–3.6)
MCH: 27.4 pg (ref 26.0–34.0)
MCHC: 32.5 g/dL (ref 32.0–36.0)
MCV: 84.4 fL (ref 80.0–100.0)
MONO ABS: 0.4 10*3/uL (ref 0.2–0.9)
MONOS PCT: 3 %
NEUTROS ABS: 14.6 10*3/uL — AB (ref 1.4–6.5)
NEUTROS PCT: 86 %
PLATELETS: 283 10*3/uL (ref 150–440)
RBC: 5.06 MIL/uL (ref 3.80–5.20)
RDW: 14 % (ref 11.5–14.5)
WBC: 17 10*3/uL — AB (ref 3.6–11.0)

## 2015-04-14 LAB — LIPASE, BLOOD: Lipase: 46 U/L (ref 22–51)

## 2015-04-14 MED ORDER — DICYCLOMINE HCL 10 MG PO CAPS
ORAL_CAPSULE | ORAL | Status: AC
  Administered 2015-04-14: 20 mg
  Filled 2015-04-14: qty 2

## 2015-04-14 MED ORDER — PROMETHAZINE HCL 25 MG/ML IJ SOLN
25.0000 mg | Freq: Once | INTRAMUSCULAR | Status: AC
  Administered 2015-04-14: 25 mg via INTRAVENOUS

## 2015-04-14 MED ORDER — DICYCLOMINE HCL 20 MG PO TABS
20.0000 mg | ORAL_TABLET | Freq: Once | ORAL | Status: AC
  Administered 2015-04-14: 20 mg via ORAL

## 2015-04-14 MED ORDER — DICYCLOMINE HCL 20 MG PO TABS: 9.0000 mg | ORAL_TABLET | Freq: Three times a day (TID) | ORAL | Status: DC | PRN

## 2015-04-14 MED ORDER — PROMETHAZINE HCL 25 MG/ML IJ SOLN
INTRAMUSCULAR | Status: AC
  Administered 2015-04-14: 25 mg via INTRAVENOUS
  Filled 2015-04-14: qty 1

## 2015-04-14 MED ORDER — SODIUM CHLORIDE 0.9 % IV SOLN
Freq: Once | INTRAVENOUS | Status: AC
  Administered 2015-04-14: 18:00:00 via INTRAVENOUS

## 2015-04-14 MED ORDER — ONDANSETRON HCL 4 MG/2ML IJ SOLN
INTRAMUSCULAR | Status: AC
  Administered 2015-04-14: 4 mg via INTRAVENOUS
  Filled 2015-04-14: qty 2

## 2015-04-14 MED ORDER — ONDANSETRON HCL 4 MG/2ML IJ SOLN
4.0000 mg | Freq: Once | INTRAMUSCULAR | Status: AC
  Administered 2015-04-14: 4 mg via INTRAVENOUS

## 2015-04-14 MED ORDER — PROMETHAZINE HCL 25 MG PO TABS: 25.0000 mg | ORAL_TABLET | Freq: Four times a day (QID) | ORAL | Status: DC | PRN

## 2015-04-14 NOTE — ED Notes (Signed)
Pt to ed with c/o abd pain, states she has hx of elevated liver enzymes and was seen by GI md last week.  Pt states yesterday had two episodes of severe pain in abd assoc with diaphoresis and diarhrea. Reports today multiple episodes of diarhrea and severe abd pain.

## 2015-04-14 NOTE — Discharge Instructions (Signed)
Abdominal Pain °Many things can cause abdominal pain. Usually, abdominal pain is not caused by a disease and will improve without treatment. It can often be observed and treated at home. Your health care provider will do a physical exam and possibly order blood tests and X-rays to help determine the seriousness of your pain. However, in many cases, more time must pass before a clear cause of the pain can be found. Before that point, your health care provider may not know if you need more testing or further treatment. °HOME CARE INSTRUCTIONS  °Monitor your abdominal pain for any changes. The following actions may help to alleviate any discomfort you are experiencing: °· Only take over-the-counter or prescription medicines as directed by your health care provider. °· Do not take laxatives unless directed to do so by your health care provider. °· Try a clear liquid diet (broth, tea, or water) as directed by your health care provider. Slowly move to a bland diet as tolerated. °SEEK MEDICAL CARE IF: °· You have unexplained abdominal pain. °· You have abdominal pain associated with nausea or diarrhea. °· You have pain when you urinate or have a bowel movement. °· You experience abdominal pain that wakes you in the night. °· You have abdominal pain that is worsened or improved by eating food. °· You have abdominal pain that is worsened with eating fatty foods. °· You have a fever. °SEEK IMMEDIATE MEDICAL CARE IF:  °· Your pain does not go away within 2 hours. °· You keep throwing up (vomiting). °· Your pain is felt only in portions of the abdomen, such as the right side or the left lower portion of the abdomen. °· You pass bloody or black tarry stools. °MAKE SURE YOU: °· Understand these instructions.   °· Will watch your condition.   °· Will get help right away if you are not doing well or get worse.   °Document Released: 08/03/2005 Document Revised: 10/29/2013 Document Reviewed: 07/03/2013 °ExitCare® Patient Information  ©2015 [employer]. This information is not intended to replace advice given to you by your health care provider. Make sure you discuss any questions you have with your health care provider. ° °Diarrhea °Diarrhea is frequent loose and watery bowel movements. It can cause you to feel weak and dehydrated. Dehydration can cause you to become tired and thirsty, have a dry mouth, and have decreased urination that often is dark yellow. Diarrhea is a sign of another problem, most often an infection that will not last long. In most cases, diarrhea typically lasts 2-3 days. However, it can last longer if it is a sign of something more serious. It is important to treat your diarrhea as directed by your caregiver to lessen or prevent future episodes of diarrhea. °CAUSES  °Some common causes include: °· Gastrointestinal infections caused by viruses, bacteria, or parasites. °· Food poisoning or food allergies. °· Certain medicines, such as antibiotics, chemotherapy, and laxatives. °· Artificial sweeteners and fructose. °· Digestive disorders. °HOME CARE INSTRUCTIONS °· Ensure adequate fluid intake (hydration): Have 1 cup (8 oz) of fluid for each diarrhea episode. Avoid fluids that contain simple sugars or sports drinks, fruit juices, whole milk products, and sodas. Your urine should be clear or pale yellow if you are drinking enough fluids. Hydrate with an oral rehydration solution that you can purchase at pharmacies, retail stores, and online. You can prepare an oral rehydration solution at home by mixing the following ingredients together: °¨  - tsp table salt. °¨ ¾ tsp baking soda. °¨    tsp salt substitute containing potassium chloride. °¨ 1  tablespoons sugar. °¨ 1 L (34 oz) of water. °· Certain foods and beverages may increase the speed at which food moves through the gastrointestinal (GI) tract. These foods and beverages should be avoided and include: °¨ Caffeinated and alcoholic beverages. °¨ High-fiber foods, such as raw  fruits and vegetables, nuts, seeds, and whole grain breads and cereals. °¨ Foods and beverages sweetened with sugar alcohols, such as xylitol, sorbitol, and mannitol. °· Some foods may be well tolerated and may help thicken stool including: °¨ Starchy foods, such as rice, toast, pasta, low-sugar cereal, oatmeal, grits, baked potatoes, crackers, and bagels. °¨ Bananas. °¨ Applesauce. °· Add probiotic-rich foods to help increase healthy bacteria in the GI tract, such as yogurt and fermented milk products. °· Wash your hands well after each diarrhea episode. °· Only take over-the-counter or prescription medicines as directed by your caregiver. °· Take a warm bath to relieve any burning or pain from frequent diarrhea episodes. °SEEK IMMEDIATE MEDICAL CARE IF:  °· You are unable to keep fluids down. °· You have persistent vomiting. °· You have blood in your stool, or your stools are black and tarry. °· You do not urinate in 6-8 hours, or there is only a small amount of very dark urine. °· You have abdominal pain that increases or localizes. °· You have weakness, dizziness, confusion, or light-headedness. °· You have a severe headache. °· Your diarrhea gets worse or does not get better. °· You have a fever or persistent symptoms for more than 2-3 days. °· You have a fever and your symptoms suddenly get worse. °MAKE SURE YOU:  °· Understand these instructions. °· Will watch your condition. °· Will get help right away if you are not doing well or get worse. °Document Released: 10/14/2002 Document Revised: 03/10/2014 Document Reviewed: 07/01/2012 °ExitCare® Patient Information ©2015 [employer]. This information is not intended to replace advice given to you by your health care provider. Make sure you discuss any questions you have with your health care provider. ° °

## 2015-04-14 NOTE — ED Provider Notes (Signed)
[facility] Emergency Department Provider Note     Time seen: ----------------------------------------- 5:35 PM on 04/14/2015 -----------------------------------------    I have reviewed the triage vital signs and the nursing notes.   HISTORY  Chief Complaint Abdominal Pain    HPI Martha Page is a 34 y.o. female who presents ER for abdominal pain and diarrhea today. Patient states she's recently seen for this abdominal pain. Patient saw Dr. Rayann Heman GI, who has a liver ultrasound scheduled for her as well as colonoscopy. Recently advised her to stop drinking milk or lactose products, describes severe generalized abdominal pain currently mild to moderate this time. Nothing seems to make it better or worse. She's had a 8 episodes of loose stools today. Reportedly had normal outpatient CT scan but has elevated liver function tests that are believed to be related to fatty liver.   Past Medical History  Diagnosis Date  . Frequent headaches   . Migraine   . Shoulder pain   . Elevated liver enzymes     Patient Active Problem List   Diagnosis Date Noted  . Generalized abdominal pain 04/02/2015  . Generalized anxiety disorder 04/02/2015  . Cluster headache 04/02/2015    Past Surgical History  Procedure Laterality Date  . Gallbladder surgery  2002  . Tubal ligation    . Cesarean section    . Diagnostic laparoscopy with removal of ectopic pregnancy    . Hemorrhoid surgery    . Intrauterine device (iud) insertion    . Lasik      Allergies Cefazolin; Amoxicillin; Codeine; Codeine sulfate; Doxycycline; Sulfa antibiotics; and Penicillin v potassium  Social History History  Substance Use Topics  . Smoking status: Never Smoker   . Smokeless tobacco: Not on file  . Alcohol Use: No    Review of Systems Constitutional: Negative for fever. Eyes: Negative for visual changes. ENT: Negative for sore throat. Cardiovascular: Negative for chest  pain. Respiratory: Negative for shortness of breath. Gastrointestinal: Positive for abdominal pain nausea and diarrhea Genitourinary: Negative for dysuria. Musculoskeletal: Negative for back pain. Skin: Negative for rash. Neurological: Negative for headaches, focal weakness or numbness.  10-point ROS otherwise negative.  ____________________________________________   PHYSICAL EXAM:  VITAL SIGNS: ED Triage Vitals  Enc Vitals Group     BP 04/14/15 1639 102/66 mmHg     Pulse Rate 04/14/15 1639 101     Resp 04/14/15 1639 18     Temp 04/14/15 1639 97.5 F (36.4 C)     Temp Source 04/14/15 1639 Oral     SpO2 04/14/15 1639 98 %     Weight 04/14/15 1639 160 lb (72.576 kg)     Height 04/14/15 1639 5\' 5"  (1.651 m)     Head Cir --      Peak Flow --      Pain Score 04/14/15 1639 5     Pain Loc --      Pain Edu? --      Excl. in [place]? --     Constitutional: Alert and oriented. Well appearing and in no distress. Eyes: Conjunctivae are normal. PERRL. Normal extraocular movements. ENT   Head: Normocephalic and atraumatic.   Nose: No congestion/rhinnorhea.   Mouth/Throat: Mucous membranes are moist.   Neck: No stridor. Hematological/Lymphatic/Immunilogical: No cervical lymphadenopathy. Cardiovascular: Normal rate, regular rhythm. Normal and symmetric distal pulses are present in all extremities. No murmurs, rubs, or gallops. Respiratory: Normal respiratory effort without tachypnea nor retractions. Breath sounds are clear and equal bilaterally. No  wheezes/rales/rhonchi. Gastrointestinal: Soft and nontender. No distention. No abdominal bruits. There is no CVA tenderness. Musculoskeletal: Nontender with normal range of motion in all extremities. No joint effusions.  No lower extremity tenderness nor edema. Neurologic:  Normal speech and language. No gross focal neurologic deficits are appreciated. Speech is normal. No gait instability. Skin:  Skin is warm, dry and intact. No  rash noted. Psychiatric: Mood and affect are normal. Speech and behavior are normal. Patient exhibits appropriate insight and judgment.  ____________________________________________  ED COURSE:  Pertinent labs & imaging results that were available during my care of the patient were reviewed by me and considered in my medical decision making (see chart for details). We'll assess her lab work, give IV fluid and antiemetics, by mouth Bentyl and reevaluate. We'll review outpatient CT and ultrasound. ____________________________________________    LABS (pertinent positives/negatives)  Labs Reviewed  CBC WITH DIFFERENTIAL/PLATELET - Abnormal; Notable for the following:    WBC 17.0 (*)    Neutro Abs 14.6 (*)    All other components within normal limits  COMPREHENSIVE METABOLIC PANEL - Abnormal; Notable for the following:    Potassium 3.4 (*)    Glucose, Bld 110 (*)    Calcium 8.8 (*)    Total Protein 8.6 (*)    AST 67 (*)    ALT 159 (*)    Alkaline Phosphatase 301 (*)    All other components within normal limits  URINALYSIS COMPLETEWITH MICROSCOPIC ([facility] ONLY) - Abnormal; Notable for the following:    Color, Urine AMBER (*)    APPearance CLEAR (*)    Specific Gravity, Urine 1.032 (*)    Hgb urine dipstick 2+ (*)    Protein, ur 30 (*)    Leukocytes, UA TRACE (*)    Bacteria, UA RARE (*)    Squamous Epithelial / LPF 0-5 (*)    All other components within normal limits  LIPASE, BLOOD  POC URINE PREG, ED  POCT PREGNANCY, URINE    RADIOLOGY  None  ____________________________________________  FINAL ASSESSMENT AND PLAN  Abdominal pain and diarrhea, elevated liver transaminase  Plan: Patient is in no acute distress, palpation labs and CT were reviewed. No clear etiology for leukocytosis. Abdomen is benign and nonsurgical. She has not had any further vomiting or diarrhea here. Did not feel too much care was Zofran severe IV Phenergan, by mouth Bentyl. She is given clear fluids  she's continuing outpatient follow-up with her colonoscopy in 3 days and her ultrasound of her liver tomorrow.   Earleen Newport, MD   Earleen Newport, MD 04/14/15 [phone]

## 2015-04-15 ENCOUNTER — Ambulatory Visit
Admission: RE | Admit: 2015-04-15 | Discharge: 2015-04-15 | Disposition: A | Discharge status: Home / Self Care | Payer: Managed Care, Other (non HMO) | Source: Ambulatory Visit | Attending: Gastroenterology | Admitting: Gastroenterology

## 2015-04-15 DIAGNOSIS — R945 Abnormal results of liver function studies: Secondary | ICD-10-CM | POA: Insufficient documentation

## 2015-04-15 DIAGNOSIS — Z9049 Acquired absence of other specified parts of digestive tract: Secondary | ICD-10-CM | POA: Insufficient documentation

## 2015-04-15 DIAGNOSIS — R748 Abnormal levels of other serum enzymes: Secondary | ICD-10-CM

## 2015-04-16 ENCOUNTER — Encounter: Payer: Self-pay | Admitting: *Deleted

## 2015-04-17 ENCOUNTER — Ambulatory Visit: Payer: Managed Care, Other (non HMO) | Admitting: Anesthesiology

## 2015-04-17 ENCOUNTER — Encounter: Payer: Self-pay | Admitting: Anesthesiology

## 2015-04-17 ENCOUNTER — Ambulatory Visit
Admission: RE | Admit: 2015-04-17 | Discharge: 2015-04-17 | Disposition: A | Discharge status: Home / Self Care | Payer: Managed Care, Other (non HMO) | Source: Ambulatory Visit | Attending: Gastroenterology | Admitting: Gastroenterology

## 2015-04-17 ENCOUNTER — Encounter
Admission: RE | Disposition: A | Discharge status: Home / Self Care | Payer: Self-pay | Source: Ambulatory Visit | Attending: Gastroenterology

## 2015-04-17 DIAGNOSIS — Z881 Allergy status to other antibiotic agents status: Secondary | ICD-10-CM | POA: Insufficient documentation

## 2015-04-17 DIAGNOSIS — R61 Generalized hyperhidrosis: Secondary | ICD-10-CM | POA: Insufficient documentation

## 2015-04-17 DIAGNOSIS — Z885 Allergy status to narcotic agent status: Secondary | ICD-10-CM | POA: Diagnosis not present

## 2015-04-17 DIAGNOSIS — Z79899 Other long term (current) drug therapy: Secondary | ICD-10-CM | POA: Diagnosis not present

## 2015-04-17 DIAGNOSIS — K625 Hemorrhage of anus and rectum: Secondary | ICD-10-CM | POA: Diagnosis present

## 2015-04-17 DIAGNOSIS — Z880 Allergy status to penicillin: Secondary | ICD-10-CM | POA: Diagnosis not present

## 2015-04-17 DIAGNOSIS — Z882 Allergy status to sulfonamides status: Secondary | ICD-10-CM | POA: Insufficient documentation

## 2015-04-17 DIAGNOSIS — F419 Anxiety disorder, unspecified: Secondary | ICD-10-CM | POA: Insufficient documentation

## 2015-04-17 DIAGNOSIS — G43909 Migraine, unspecified, not intractable, without status migrainosus: Secondary | ICD-10-CM | POA: Insufficient documentation

## 2015-04-17 DIAGNOSIS — K644 Residual hemorrhoidal skin tags: Secondary | ICD-10-CM | POA: Diagnosis not present

## 2015-04-17 DIAGNOSIS — K121 Other forms of stomatitis: Secondary | ICD-10-CM | POA: Insufficient documentation

## 2015-04-17 DIAGNOSIS — K921 Melena: Secondary | ICD-10-CM | POA: Diagnosis not present

## 2015-04-17 HISTORY — DX: Anxiety disorder, unspecified: F41.9

## 2015-04-17 HISTORY — PX: COLONOSCOPY WITH PROPOFOL: SHX5780

## 2015-04-17 LAB — HM COLONOSCOPY: HM COLON: NORMAL

## 2015-04-17 SURGERY — COLONOSCOPY WITH PROPOFOL
Anesthesia: General

## 2015-04-17 MED ORDER — FENTANYL CITRATE (PF) 100 MCG/2ML IJ SOLN
INTRAMUSCULAR | Status: DC | PRN
  Administered 2015-04-17: 50 ug via INTRAVENOUS

## 2015-04-17 MED ORDER — SODIUM CHLORIDE 0.9 % IV SOLN: INTRAVENOUS | Status: DC

## 2015-04-17 MED ORDER — PROPOFOL INFUSION 10 MG/ML OPTIME
INTRAVENOUS | Status: DC | PRN
  Administered 2015-04-17: 120 ug/kg/min via INTRAVENOUS

## 2015-04-17 MED ORDER — LIDOCAINE HCL (CARDIAC) 20 MG/ML IV SOLN
INTRAVENOUS | Status: DC | PRN
  Administered 2015-04-17: 50 mg via INTRAVENOUS

## 2015-04-17 MED ORDER — SODIUM CHLORIDE 0.9 % IV SOLN
INTRAVENOUS | Status: DC
  Administered 2015-04-17: 1000 mL via INTRAVENOUS

## 2015-04-17 MED ORDER — PROPOFOL 10 MG/ML IV BOLUS
INTRAVENOUS | Status: DC | PRN
  Administered 2015-04-17: 100 mg via INTRAVENOUS
  Administered 2015-04-17 (×3): 50 mg via INTRAVENOUS

## 2015-04-17 MED ORDER — MIDAZOLAM HCL 5 MG/5ML IJ SOLN
INTRAMUSCULAR | Status: DC | PRN
  Administered 2015-04-17: 1 mg via INTRAVENOUS

## 2015-04-17 NOTE — Anesthesia Postprocedure Evaluation (Signed)
  Anesthesia Post-op Note  Patient: Martha Page  Procedure(s) Performed: Procedure(s): COLONOSCOPY WITH PROPOFOL (N/A)  Anesthesia type:General  Patient location: PACU  Post pain: Pain level controlled  Post assessment: Post-op Vital signs reviewed, Patient's Cardiovascular Status Stable, Respiratory Function Stable, Patent Airway and No signs of Nausea or vomiting  Post vital signs: Reviewed and stable  Last Vitals:  Filed Vitals:   04/17/15 1420  BP: 118/85  Pulse: 80  Temp:   Resp: 14    Level of consciousness: awake, alert  and patient cooperative  Complications: No apparent anesthesia complications

## 2015-04-17 NOTE — Op Note (Signed)
[facility] Gastroenterology Patient Name: Martha Page Procedure Date: 04/17/2015 1:04 PM MRN: [id number] Account #: [id number] Date of Birth: 09/09/1981 Admit Type: Outpatient Age: 34 Room: [facility] ENDO ROOM 2 Gender: Female Note Status: Finalized Procedure:         Colonoscopy Indications:       Hematochezia, oral ulcers, night sweats Patient Profile:   This is a 34 year old female. Providers:         Gerrit Heck. Rayann Heman, MD Referring MD:      Pleas Koch (Referring MD) Medicines:         Propofol per Anesthesia Complications:     No immediate complications. Procedure:         Pre-Anesthesia Assessment:                    - Prior to the procedure, a History and Physical was                     performed, and patient medications, allergies and                     sensitivities were reviewed. The patient's tolerance of                     previous anesthesia was reviewed.                    After obtaining informed consent, the colonoscope was                     passed under direct vision. Throughout the procedure, the                     patient's blood pressure, pulse, and oxygen saturations                     were monitored continuously. The Colonoscope was                     introduced through the anus and advanced to the the cecum,                     identified by appendiceal orifice and ileocecal valve. The                     colonoscopy was performed without difficulty. The patient                     tolerated the procedure well. The quality of the bowel                     preparation was good. Findings:      The perianal exam findings include non-thrombosed external hemorrhoids.      The entire examined colon appeared normal on direct and retroflexion       views.      The terminal ileum appeared normal. Impression:        - Non-thrombosed external hemorrhoids found on perianal                     exam.                    - The entire  examined colon is normal on direct and  retroflexion views.                    - The examined portion of the ileum was normal.                    - No specimens collected.                    - No evidence of ulcerative colitis or crohn's disease. Recommendation:    - Observe patient in GI recovery unit.                    - Continue present medications.                    - Return to referring physician.                    - Consider surgical referral for bleeding ext hemorrhoidis.                    - The findings and recommendations were discussed with the                     patient.                    - The findings and recommendations were discussed with the                     patient's family. Procedure Code(s): --- Professional ---                    [phone], Colonoscopy, flexible; diagnostic, including                     collection of specimen(s) by brushing or washing, when                     performed (separate procedure) CPT copyright 2014 American [employer] Association. All rights reserved. The codes documented in this report are preliminary and upon coder review may  be revised to meet current compliance requirements. Mellody Life, MD 04/17/2015 2:13:06 PM This report has been signed electronically. Number of Addenda: 0 Note Initiated On: 04/17/2015 1:04 PM Scope Withdrawal Time: 0 hours 21 minutes 19 seconds  Total Procedure Duration: 0 hours 28 minutes 29 seconds       [facility]

## 2015-04-17 NOTE — Anesthesia Preprocedure Evaluation (Signed)
Anesthesia Evaluation  Patient identified by MRN, date of birth, ID band Patient awake    Reviewed: Allergy & Precautions, NPO status , Patient's Chart, lab work & pertinent test results  Airway Mallampati: II       Dental no notable dental hx.  Invisiline on teeth:   Pulmonary neg pulmonary ROS,    Pulmonary exam normal       Cardiovascular negative cardio ROS Normal cardiovascular examRhythm:Regular Rate:Normal     Neuro/Psych  Headaches, PSYCHIATRIC DISORDERS Anxiety Cluster headaches    GI/Hepatic Neg liver ROS, Abdominal pain   Endo/Other  negative endocrine ROS  Renal/GU negative Renal ROS  negative genitourinary   Musculoskeletal negative musculoskeletal ROS (+)   Abdominal Normal abdominal exam  (+)   Peds negative pediatric ROS (+)  Hematology   Anesthesia Other Findings No tubes and no period since April  Reproductive/Obstetrics negative OB ROS                             Anesthesia Physical Anesthesia Plan  ASA: II  Anesthesia Plan: General   Post-op Pain Management:    Induction: Intravenous  Airway Management Planned: Nasal Cannula  Additional Equipment:   Intra-op Plan:   Post-operative Plan:   Informed Consent: I have reviewed the patients History and Physical, chart, labs and discussed the procedure including the risks, benefits and alternatives for the proposed anesthesia with the patient or authorized representative who has indicated his/her understanding and acceptance.   Dental advisory given  Plan Discussed with: CRNA and Surgeon  Anesthesia Plan Comments:         Anesthesia Quick Evaluation

## 2015-04-17 NOTE — H&P (Signed)
Primary Care Physician:  Sheral Flow, NP  Pre-Procedure History & Physical: HPI:  ALAISHA Page is a 34 y.o. female is here for an colonoscopy.   Past Medical History  Diagnosis Date  . Frequent headaches   . Migraine   . Shoulder pain   . Elevated liver enzymes   . Anxiety     Past Surgical History  Procedure Laterality Date  . Gallbladder surgery  2002  . Tubal ligation    . Cesarean section    . Diagnostic laparoscopy with removal of ectopic pregnancy    . Hemorrhoid surgery    . Intrauterine device (iud) insertion    . Lasik      Prior to Admission medications   Medication Sig Start Date End Date Taking? Authorizing Provider  venlafaxine XR (EFFEXOR-XR) 150 MG 24 hr capsule  03/03/15  Yes Historical Provider, MD  benzonatate (TESSALON) 200 MG capsule Take 1 capsule (200 mg total) by mouth 3 (three) times daily as needed. 04/03/15   Pleas Koch, NP  dicyclomine (BENTYL) 20 MG tablet Take 0.5 tablets (10 mg total) by mouth 3 (three) times daily as needed for spasms. 04/14/15   Earleen Newport, MD  loratadine (CLARITIN) 10 MG tablet Take 10 mg by mouth daily. 03/19/15   Historical Provider, MD  meloxicam (MOBIC) 15 MG tablet Take 15 mg by mouth daily. 03/03/15   Historical Provider, MD  promethazine (PHENERGAN) 25 MG tablet Take 1 tablet (25 mg total) by mouth every 6 (six) hours as needed for nausea or vomiting. 04/14/15   Earleen Newport, MD    Allergies as of 04/09/2015 - Review Complete 04/03/2015  Allergen Reaction Noted  . Cefazolin Rash and Shortness Of Breath 04/02/2015  . Amoxicillin Other (See Comments) 04/02/2015  . Codeine  04/02/2015  . Codeine sulfate Itching 04/02/2015  . Doxycycline Other (See Comments) 04/02/2015  . Sulfa antibiotics Other (See Comments) 04/02/2015  . Penicillin v potassium Rash 04/02/2015    Family History  Problem Relation Age of Onset  . Stroke Mother   . Heart disease Father   . Arthritis Paternal  Grandmother   . Cancer Paternal Grandmother     lung  . Heart disease Paternal Grandmother   . Stroke Paternal Grandmother   . Hypertension Paternal Grandmother     History   Social History  . Marital Status: Single    Spouse Name: N/A  . Number of Children: N/A  . Years of Education: N/A   Occupational History  . Not on file.   Social History Main Topics  . Smoking status: Never Smoker   . Smokeless tobacco: Not on file  . Alcohol Use: No  . Drug Use: No  . Sexual Activity: Yes   Other Topics Concern  . Not on file   Social History Narrative   Married.   6 children.   Works as a Agricultural engineer.           Physical Exam: BP 99/69 mmHg  Pulse 2  Temp(Src) 98 F (36.7 C)  Resp 18  Ht 5\' 5"  (1.651 m)  SpO2 100%  LMP 03/05/2015 General:   Alert,  pleasant and cooperative in NAD Head:  Normocephalic and atraumatic. Neck:  Supple; no masses or thyromegaly. Lungs:  Clear throughout to auscultation.    Heart:  Regular rate and rhythm. Abdomen:  Soft, nontender and nondistended. Normal bowel sounds, without guarding, and without rebound.   Neurologic:  Alert and  oriented x4;  grossly  normal neurologically.  Impression/Plan: Martha Page is here for an colonoscopy to be performed for rectal bleeding, oral ulcers, night sweats  Risks, benefits, limitations, and alternatives regarding  colonoscopy have been reviewed with the patient.  Questions have been answered.  All parties agreeable.   Josefine Class, MD  04/17/2015, 1:33 PM

## 2015-04-17 NOTE — Transfer of Care (Signed)
Immediate Anesthesia Transfer of Care Note  Patient: Martha Page  Procedure(s) Performed: Procedure(s): COLONOSCOPY WITH PROPOFOL (N/A)  Patient Location: Endoscopy Unit  Anesthesia Type:General  Level of Consciousness: awake  Airway & Oxygen Therapy: Patient Spontanous Breathing and Patient connected to nasal cannula oxygen  Post-op Assessment: Report given to RN  Post vital signs: Reviewed  Last Vitals:  Filed Vitals:   04/17/15 1412  BP: 118/85  Pulse: 86  Temp: 36.1 C  Resp: 18    Complications: No apparent anesthesia complications

## 2015-04-22 ENCOUNTER — Encounter: Payer: Self-pay | Admitting: Gastroenterology

## 2015-04-23 ENCOUNTER — Other Ambulatory Visit: Payer: Self-pay

## 2015-04-23 DIAGNOSIS — J309 Allergic rhinitis, unspecified: Secondary | ICD-10-CM | POA: Insufficient documentation

## 2015-04-23 DIAGNOSIS — R079 Chest pain, unspecified: Secondary | ICD-10-CM | POA: Insufficient documentation

## 2015-04-23 DIAGNOSIS — R519 Headache, unspecified: Secondary | ICD-10-CM | POA: Insufficient documentation

## 2015-04-23 DIAGNOSIS — F329 Major depressive disorder, single episode, unspecified: Secondary | ICD-10-CM | POA: Insufficient documentation

## 2015-04-23 DIAGNOSIS — R51 Headache: Secondary | ICD-10-CM

## 2015-04-23 DIAGNOSIS — R55 Syncope and collapse: Secondary | ICD-10-CM | POA: Insufficient documentation

## 2015-04-23 DIAGNOSIS — T148XXA Other injury of unspecified body region, initial encounter: Secondary | ICD-10-CM | POA: Insufficient documentation

## 2015-04-23 DIAGNOSIS — F32A Depression, unspecified: Secondary | ICD-10-CM | POA: Insufficient documentation

## 2015-06-01 ENCOUNTER — Inpatient Hospital Stay
Admission: RE | Admit: 2015-06-01 | Discharge: 2015-06-01 | Disposition: A | Discharge status: Home / Self Care | Payer: Managed Care, Other (non HMO) | Source: Ambulatory Visit

## 2015-06-01 NOTE — Patient Instructions (Signed)
  Your procedure is scheduled on: 06/05/15  Friday  Report to Day Surgery. To find out your arrival time please call [phone] between 1PM - 3PM on 06/04/15 Thurs.  Remember: Instructions that are not followed completely may result in serious medical risk, up to and including death, or upon the discretion of your surgeon and anesthesiologist your surgery may need to be rescheduled.    _x___ 1. Do not eat food or drink liquids after midnight. No gum chewing or hard candies.     _x___ 2. No Alcohol for 24 hours before or after surgery.   ____ 3. Bring all medications with you on the day of surgery if instructed.    _x___ 4. Notify your doctor if there is any change in your medical condition     (cold, fever, infections).     Do not wear jewelry, make-up, hairpins, clips or nail polish.  Do not wear lotions, powders, or perfumes. You may wear deodorant.  Do not shave 48 hours prior to surgery. Men may shave face and neck.  Do not bring valuables to the hospital.    [facility] is not responsible for any belongings or valuables.               Contacts, dentures or bridgework may not be worn into surgery.  Leave your suitcase in the car. After surgery it may be brought to your room.  For patients admitted to the hospital, discharge time is determined by your                treatment team.   Patients discharged the day of surgery will not be allowed to drive home.   Please read over the following fact sheets that you were given:      ____ Take these medicines the morning of surgery with A SIP OF WATER:    1.  2.   3.   4.  5.  6.  ____ Fleet Enema (as directed)   ____ Use CHG Soap as directed  ____ Use inhalers on the day of surgery  ____ Stop metformin 2 days prior to surgery    ____ Take 1/2 of usual insulin dose the night before surgery and none on the morning of surgery.   ____ Stop Coumadin/Plavix/aspirin on   ____ Stop Anti-inflammatories on   ____ Stop  supplements until after surgery.    ____ Bring C-Pap to the hospital.

## 2015-06-05 ENCOUNTER — Ambulatory Visit: Payer: Managed Care, Other (non HMO) | Admitting: *Deleted

## 2015-06-05 ENCOUNTER — Ambulatory Visit
Admission: RE | Admit: 2015-06-05 | Discharge: 2015-06-05 | Disposition: A | Discharge status: Home / Self Care | Payer: Managed Care, Other (non HMO) | Source: Ambulatory Visit | Attending: Surgery | Admitting: Surgery

## 2015-06-05 ENCOUNTER — Encounter: Payer: Self-pay | Admitting: *Deleted

## 2015-06-05 ENCOUNTER — Encounter
Admission: RE | Disposition: A | Discharge status: Home / Self Care | Payer: Self-pay | Source: Ambulatory Visit | Attending: Surgery

## 2015-06-05 DIAGNOSIS — K648 Other hemorrhoids: Secondary | ICD-10-CM | POA: Insufficient documentation

## 2015-06-05 DIAGNOSIS — F419 Anxiety disorder, unspecified: Secondary | ICD-10-CM | POA: Diagnosis not present

## 2015-06-05 DIAGNOSIS — K6289 Other specified diseases of anus and rectum: Secondary | ICD-10-CM | POA: Diagnosis not present

## 2015-06-05 DIAGNOSIS — Z8249 Family history of ischemic heart disease and other diseases of the circulatory system: Secondary | ICD-10-CM | POA: Insufficient documentation

## 2015-06-05 DIAGNOSIS — Z823 Family history of stroke: Secondary | ICD-10-CM | POA: Insufficient documentation

## 2015-06-05 DIAGNOSIS — Z885 Allergy status to narcotic agent status: Secondary | ICD-10-CM | POA: Insufficient documentation

## 2015-06-05 DIAGNOSIS — Z832 Family history of diseases of the blood and blood-forming organs and certain disorders involving the immune mechanism: Secondary | ICD-10-CM | POA: Insufficient documentation

## 2015-06-05 DIAGNOSIS — K644 Residual hemorrhoidal skin tags: Secondary | ICD-10-CM | POA: Insufficient documentation

## 2015-06-05 DIAGNOSIS — Z87891 Personal history of nicotine dependence: Secondary | ICD-10-CM | POA: Diagnosis not present

## 2015-06-05 DIAGNOSIS — Z881 Allergy status to other antibiotic agents status: Secondary | ICD-10-CM | POA: Diagnosis not present

## 2015-06-05 DIAGNOSIS — Z79899 Other long term (current) drug therapy: Secondary | ICD-10-CM | POA: Diagnosis not present

## 2015-06-05 DIAGNOSIS — Z833 Family history of diabetes mellitus: Secondary | ICD-10-CM | POA: Diagnosis not present

## 2015-06-05 DIAGNOSIS — Z800 Family history of malignant neoplasm of digestive organs: Secondary | ICD-10-CM | POA: Diagnosis not present

## 2015-06-05 DIAGNOSIS — Z882 Allergy status to sulfonamides status: Secondary | ICD-10-CM | POA: Diagnosis not present

## 2015-06-05 DIAGNOSIS — Z880 Allergy status to penicillin: Secondary | ICD-10-CM | POA: Insufficient documentation

## 2015-06-05 DIAGNOSIS — Z9049 Acquired absence of other specified parts of digestive tract: Secondary | ICD-10-CM | POA: Insufficient documentation

## 2015-06-05 HISTORY — PX: HEMORRHOID SURGERY: SHX153

## 2015-06-05 LAB — POCT PREGNANCY, URINE: Preg Test, Ur: NEGATIVE

## 2015-06-05 SURGERY — HEMORRHOIDECTOMY
Anesthesia: General | Wound class: Clean Contaminated

## 2015-06-05 MED ORDER — FENTANYL CITRATE (PF) 100 MCG/2ML IJ SOLN
INTRAMUSCULAR | Status: DC | PRN
  Administered 2015-06-05 (×2): 50 ug via INTRAVENOUS

## 2015-06-05 MED ORDER — BUPIVACAINE LIPOSOME 1.3 % IJ SUSP
INTRAMUSCULAR | Status: DC | PRN
Start: 2015-06-05 — End: 2015-06-05
  Administered 2015-06-05 (×2): 10 mL

## 2015-06-05 MED ORDER — OXYCODONE-ACETAMINOPHEN 5-325 MG PO TABS
ORAL_TABLET | ORAL | Status: AC
  Filled 2015-06-05: qty 2

## 2015-06-05 MED ORDER — OXYCODONE-ACETAMINOPHEN 5-325 MG PO TABS
1.0000 | ORAL_TABLET | ORAL | Status: DC | PRN
  Administered 2015-06-05: 2 via ORAL

## 2015-06-05 MED ORDER — ONDANSETRON HCL 4 MG/2ML IJ SOLN
4.0000 mg | Freq: Once | INTRAMUSCULAR | Status: AC | PRN
  Administered 2015-06-05: 4 mg via INTRAVENOUS

## 2015-06-05 MED ORDER — OXYCODONE-ACETAMINOPHEN 5-325 MG PO TABS: 1.0000 | ORAL_TABLET | ORAL | Status: DC | PRN

## 2015-06-05 MED ORDER — ONDANSETRON HCL 4 MG/2ML IJ SOLN
INTRAMUSCULAR | Status: DC | PRN
  Administered 2015-06-05: 4 mg via INTRAVENOUS

## 2015-06-05 MED ORDER — LIDOCAINE HCL (CARDIAC) 20 MG/ML IV SOLN
INTRAVENOUS | Status: DC | PRN
  Administered 2015-06-05: 80 mg via INTRAVENOUS

## 2015-06-05 MED ORDER — FAMOTIDINE 20 MG PO TABS
ORAL_TABLET | ORAL | Status: AC
  Administered 2015-06-05: 20 mg via ORAL
  Filled 2015-06-05: qty 1

## 2015-06-05 MED ORDER — ONDANSETRON HCL 4 MG/2ML IJ SOLN: INTRAMUSCULAR | Status: DC | PRN

## 2015-06-05 MED ORDER — FAMOTIDINE 20 MG PO TABS
20.0000 mg | ORAL_TABLET | Freq: Once | ORAL | Status: AC
  Administered 2015-06-05: 20 mg via ORAL

## 2015-06-05 MED ORDER — BUPIVACAINE LIPOSOME 1.3 % IJ SUSP
INTRAMUSCULAR | Status: AC
  Filled 2015-06-05: qty 20

## 2015-06-05 MED ORDER — FENTANYL CITRATE (PF) 100 MCG/2ML IJ SOLN
INTRAMUSCULAR | Status: AC
  Administered 2015-06-05: 25 ug via INTRAVENOUS
  Filled 2015-06-05: qty 2

## 2015-06-05 MED ORDER — ONDANSETRON HCL 4 MG/2ML IJ SOLN
INTRAMUSCULAR | Status: AC
  Administered 2015-06-05: 4 mg via INTRAVENOUS
  Filled 2015-06-05: qty 2

## 2015-06-05 MED ORDER — MIDAZOLAM HCL 2 MG/2ML IJ SOLN
INTRAMUSCULAR | Status: DC | PRN
  Administered 2015-06-05: 2 mg via INTRAVENOUS

## 2015-06-05 MED ORDER — BUPIVACAINE-EPINEPHRINE (PF) 0.5% -1:200000 IJ SOLN
INTRAMUSCULAR | Status: AC
  Filled 2015-06-05: qty 30

## 2015-06-05 MED ORDER — LACTATED RINGERS IV SOLN
INTRAVENOUS | Status: DC
  Administered 2015-06-05 (×2): via INTRAVENOUS

## 2015-06-05 MED ORDER — FENTANYL CITRATE (PF) 100 MCG/2ML IJ SOLN
25.0000 ug | INTRAMUSCULAR | Status: DC | PRN
  Administered 2015-06-05 (×4): 25 ug via INTRAVENOUS

## 2015-06-05 MED ORDER — PROPOFOL 10 MG/ML IV BOLUS
INTRAVENOUS | Status: DC | PRN
  Administered 2015-06-05: 200 mg via INTRAVENOUS

## 2015-06-05 SURGICAL SUPPLY — 28 items
BLADE SURG 15 STRL LF DISP TIS (BLADE) ×1 IMPLANT
BLADE SURG 15 STRL SS (BLADE) ×2
CANISTER SUCT 1200ML W/VALVE (MISCELLANEOUS) ×3 IMPLANT
DRAPE LAPAROTOMY 100X77 ABD (DRAPES) ×3 IMPLANT
DRAPE LEGGINS SURG 28X43 STRL (DRAPES) ×3 IMPLANT
DRAPE UNDER BUTTOCK W/FLU (DRAPES) ×3 IMPLANT
GAUZE SPONGE 4X4 12PLY STRL (GAUZE/BANDAGES/DRESSINGS) ×3 IMPLANT
GLOVE BIO SURGEON STRL SZ7.5 (GLOVE) ×15 IMPLANT
GOWN STRL REUS W/ TWL LRG LVL3 (GOWN DISPOSABLE) ×3 IMPLANT
GOWN STRL REUS W/TWL LRG LVL3 (GOWN DISPOSABLE) ×6
HARMONIC SCALPEL FOCUS (MISCELLANEOUS) IMPLANT
JELLY LUB 2OZ STRL (MISCELLANEOUS) ×2
JELLY LUBE 2OZ STRL (MISCELLANEOUS) ×1 IMPLANT
LABEL OR SOLS (LABEL) ×3 IMPLANT
NDL SAFETY 25GX1.5 (NEEDLE) ×3 IMPLANT
NS IRRIG 500ML POUR BTL (IV SOLUTION) ×3 IMPLANT
PACK BASIN MINOR ARMC (MISCELLANEOUS) ×3 IMPLANT
PAD ABD DERMACEA PRESS 5X9 (GAUZE/BANDAGES/DRESSINGS) ×3 IMPLANT
PAD GROUND ADULT SPLIT (MISCELLANEOUS) ×3 IMPLANT
PAD PREP 24X41 OB/GYN DISP (PERSONAL CARE ITEMS) ×3 IMPLANT
SOL PREP PVP 2OZ (MISCELLANEOUS) ×3
SOLUTION PREP PVP 2OZ (MISCELLANEOUS) ×1 IMPLANT
STAPLER PROXIMATE HCS (STAPLE) IMPLANT
STRAP SAFETY BODY (MISCELLANEOUS) IMPLANT
SUT CHROMIC 2 0 SH (SUTURE) ×3 IMPLANT
SUT CHROMIC 3 0 SH 27 (SUTURE) ×6 IMPLANT
SUT PROLENE 3 0 PS 2 (SUTURE) IMPLANT
SYRINGE 10CC LL (SYRINGE) ×3 IMPLANT

## 2015-06-05 NOTE — Anesthesia Preprocedure Evaluation (Addendum)
Anesthesia Evaluation  Patient identified by MRN, date of birth, ID band  Reviewed: Allergy & Precautions, NPO status , Patient's Chart, lab work & pertinent test results  Airway Mallampati: I  TM Distance: >3 FB Neck ROM: Full    Dental  (+) Teeth Intact   Pulmonary  breath sounds clear to auscultation        Cardiovascular Exercise Tolerance: Good Normal cardiovascular examRhythm:Regular Rate:Normal     Neuro/Psych    GI/Hepatic negative GI ROS,   Endo/Other    Renal/GU Renal diseaseHydronephrosis.     Musculoskeletal   Abdominal Normal abdominal exam  (+)  Abdomen: soft.    Peds  Hematology Hb is normal at 13.8 now.   Anesthesia Other Findings   Reproductive/Obstetrics                            Anesthesia Physical Anesthesia Plan  ASA: II  Anesthesia Plan: General   Post-op Pain Management:    Induction:   Airway Management Planned: LMA  Additional Equipment:   Intra-op Plan:   Post-operative Plan: Extubation in OR  Informed Consent: I have reviewed the patients History and Physical, chart, labs and discussed the procedure including the risks, benefits and alternatives for the proposed anesthesia with the patient or authorized representative who has indicated his/her understanding and acceptance.     Plan Discussed with: CRNA  Anesthesia Plan Comments:         Anesthesia Quick Evaluation

## 2015-06-05 NOTE — OR Nursing (Signed)
Waiting on Dr Tamala Julian to come talk with pt before discharge.

## 2015-06-05 NOTE — Discharge Instructions (Signed)
Take Tylenol or Percocet if needed for pain.  Remove dressing tomorrow. May then shower and/or sitting in warm water.  Tuck gauze or pad in underwear as needed for drainage.

## 2015-06-05 NOTE — Anesthesia Postprocedure Evaluation (Signed)
  Anesthesia Post-op Note  Patient: Martha Page  Procedure(s) Performed: Procedure(s): HEMORRHOIDECTOMY (N/A)  Anesthesia type:General  Patient location: PACU  Post pain: Pain level controlled  Post assessment: Post-op Vital signs reviewed, Patient's Cardiovascular Status Stable, Respiratory Function Stable, Patent Airway and No signs of Nausea or vomiting  Post vital signs: Reviewed and stable  Last Vitals:  Filed Vitals:   06/05/15 1226  BP: 141/83  Pulse: 107  Temp:   Resp: 17    Level of consciousness: awake, alert  and patient cooperative  Complications: No apparent anesthesia complications

## 2015-06-05 NOTE — H&P (Signed)
She reports no change in condition since the day of the office visit.  Plan is to do an internal and external hemorrhoidectomy.  Rochel Brome M.D.

## 2015-06-05 NOTE — Op Note (Signed)
OPERATIVE REPORT  PREOPERATIVE  DIAGNOSIS: . Hemorrhoids  POSTOPERATIVE DIAGNOSIS: Hemorrhoids.  PROCEDURE: . Hemorrhoidectomy  ANESTHESIA:  General  SURGEON: Rochel Brome  MD   INDICATIONS: . She has a history of anal pain and prolapse and bleeding. She had physical findings of large internal and external hemorrhoids. Surgery was recommended for definitive treatment.  With the patient on the operating table in the supine position she was placed under general anesthesia. The legs were elevated into the lithotomy position using ankle straps. The anal area was prepared with Betadine solution and draped with sterile towels and sheets.  Large hemorrhoids were noted at the 2:00 and 5:00 and 10:00 position. The anoderm was infiltrated with Exparel. Deeper tissues surrounding the sphincter were infiltrated. The anal canal was dilated large enough to admit 3 fingers. The bivalve anal retractor was introduced and further examined demonstrating large internal hemorrhoids. At the 2:00 position a 3-0 chromic suture ligature was placed at the upper extent of the internal hemorrhoid. A V-shaped incision was made externally. The dissection was begun with scissors and then continued with electrocautery. The internal anal sphincter was identified. The hemorrhoid was dissected away from the underlying internal anal sphincter. The dissection was carried up to the previously placed suture ligature. The hemorrhoid was ligated with 3-0 chromic and excised. The wound was inspected and could see hemostasis was intact. The wound was closed with a running locked tied 3-0 chromic suture leaving a small opening externally for drainage.  A similar procedure was carried out at the 5:00 position with a similar suture ligature excision and closure.  A similar procedure was carried out at the 10:00 position with a similar suture ligature excision and closure.  I can see that there were additional hemorrhoids on the right side  and posterior however it appeared that excision of these may compromise the caliber of the lumen.  The operative site was again cleaned with Betadine and infiltrated with additional Exparel. Dressings were applied with paper tape  The patient appear to be in satisfactory condition and was prepared for transfer to the recovery room  [place].D.

## 2015-06-05 NOTE — Transfer of Care (Signed)
Immediate Anesthesia Transfer of Care Note  Patient: Martha Page  Procedure(s) Performed: Procedure(s): HEMORRHOIDECTOMY (N/A)  Patient Location: PACU  Anesthesia Type:General  Level of Consciousness: awake  Airway & Oxygen Therapy: Patient Spontanous Breathing  Post-op Assessment: Report given to RN  Post vital signs: stable  Last Vitals:  Filed Vitals:   06/05/15 1028  BP: 115/76  Pulse: 81  Temp: 36.7 C  Resp: 16    Complications: No apparent anesthesia complications

## 2015-06-07 ENCOUNTER — Encounter: Payer: Self-pay | Admitting: Surgery

## 2015-06-08 ENCOUNTER — Encounter: Payer: Self-pay | Admitting: Primary Care

## 2015-06-08 ENCOUNTER — Ambulatory Visit (INDEPENDENT_AMBULATORY_CARE_PROVIDER_SITE_OTHER): Payer: Managed Care, Other (non HMO) | Admitting: Primary Care

## 2015-06-08 VITALS — BP 116/66 | HR 85 | Temp 97.4°F | Ht 65.0 in | Wt 169.1 lb

## 2015-06-08 DIAGNOSIS — G8918 Other acute postprocedural pain: Secondary | ICD-10-CM | POA: Diagnosis not present

## 2015-06-08 DIAGNOSIS — R1084 Generalized abdominal pain: Secondary | ICD-10-CM

## 2015-06-08 DIAGNOSIS — F411 Generalized anxiety disorder: Secondary | ICD-10-CM | POA: Diagnosis not present

## 2015-06-08 LAB — SURGICAL PATHOLOGY

## 2015-06-08 MED ORDER — OXYCODONE-ACETAMINOPHEN 5-325 MG PO TABS: 1.0000 | ORAL_TABLET | Freq: Four times a day (QID) | ORAL | Status: DC | PRN

## 2015-06-08 MED ORDER — SERTRALINE HCL 50 MG PO TABS: 50.0000 mg | ORAL_TABLET | Freq: Every day | ORAL | Status: DC

## 2015-06-08 NOTE — Progress Notes (Signed)
Pre visit review using our clinic review tool, if applicable. No additional management support is needed unless otherwise documented below in the visit note. 

## 2015-06-08 NOTE — Progress Notes (Signed)
Subjective:    Patient ID: Martha Page, female    DOB: 12/06/1980, 34 y.o.   MRN: [id number]  HPI  Martha Page is a 34 year old female who presents today for follow up.   1) Abdominal pain: Overall improvement in abdominal pain since initiation of probiotics and omeprazole. She underwent colonoscopy and abdominal ultrasound with GI and had normal results of both. She currently does not have follow up with GI at this point. She underwent surgery for hemorrhoidectomy on 06/05/15 and reports continued soreness. She was on percocet 5/325 mg which helped but is currently out and would like a refill.  2) Generalized anxiety disorder: She is currently managed on Effexor XR 150 mg. Overall she feels as though her anxiety has become worse. She continues to feel irritable easily throughout the day, has had one panic attack several weeks ago. She is starting back at work soon and is worried she will be anxious. Sleeping well mostly.   3) Joint pain: Followed with rheumatology on 04/08/15 and underwent joint injections and overall feels improved.   Review of Systems  Constitutional: Negative for fever and chills.  Respiratory: Negative for shortness of breath.   Cardiovascular: Negative for chest pain.  Gastrointestinal: Negative for diarrhea and constipation.  Musculoskeletal: Negative for arthralgias.  Neurological: Negative for dizziness and headaches.  Psychiatric/Behavioral: The patient is nervous/anxious.        Past Medical History  Diagnosis Date  . Frequent headaches   . Migraine   . Shoulder pain   . Elevated liver enzymes   . Anxiety     History   Social History  . Marital Status: Single    Spouse Name: N/A  . Number of Children: N/A  . Years of Education: N/A   Occupational History  . Not on file.   Social History Main Topics  . Smoking status: Never Smoker   . Smokeless tobacco: Not on file  . Alcohol Use: No  . Drug Use: No  . Sexual Activity: Yes   Other  Topics Concern  . Not on file   Social History Narrative   Married.   6 children.   Works as a Agricultural engineer.          Past Surgical History  Procedure Laterality Date  . Gallbladder surgery  2002  . Tubal ligation    . Cesarean section    . Diagnostic laparoscopy with removal of ectopic pregnancy    . Hemorrhoid surgery    . Intrauterine device (iud) insertion    . Lasik    . Colonoscopy with propofol N/A 04/17/2015    Procedure: COLONOSCOPY WITH PROPOFOL;  Surgeon: Josefine Class, MD;  Location: [facility] ENDOSCOPY;  Service: Endoscopy;  Laterality: N/A;  . Cholecystectomy  2003  . Cesarean section    . Dilation and curettage of uterus    . Ectopic pregnancy surgery    . Dupuytren contracture release    . Hemorrhoid surgery N/A 06/05/2015    Procedure: HEMORRHOIDECTOMY;  Surgeon: Leonie Green, MD;  Location: [facility] ORS;  Service: General;  Laterality: N/A;    Family History  Problem Relation Age of Onset  . Stroke Mother   . Depression Mother   . Heart disease Father   . Arthritis Paternal Grandmother   . Cancer Paternal Grandmother     lung  . Heart disease Paternal Grandmother   . Stroke Paternal Grandmother   . Hypertension Paternal Grandmother     Allergies  Allergen  Reactions  . Cefazolin Rash and Shortness Of Breath  . Amoxicillin Other (See Comments)  . Codeine Sulfate Itching  . Doxycycline Other (See Comments)    "stomach upset" per pt records  . Sulfa Antibiotics Other (See Comments)  . Penicillin V Potassium Rash    Current Outpatient Prescriptions on File Prior to Visit  Medication Sig Dispense Refill  . Loratadine (CLARITIN) 10 MG CAPS Take 1 capsule by mouth daily as needed.    . venlafaxine XR (EFFEXOR-XR) 150 MG 24 hr capsule   11   No current facility-administered medications on file prior to visit.    BP 116/66 mmHg  Pulse 85  Temp(Src) 97.4 F (36.3 C) (Oral)  Ht 5\' 5"  (1.651 m)  Wt 169 lb 1.9 oz (76.712 kg)  BMI 28.14 kg/m2   SpO2 98%  LMP 02/19/2015    Objective:   Physical Exam  Constitutional: She is oriented to person, place, and time. She appears well-nourished.  Cardiovascular: Normal rate and regular rhythm.   Pulmonary/Chest: Effort normal and breath sounds normal.  Neurological: She is alert and oriented to person, place, and time.  Skin: Skin is warm and dry.  Psychiatric: She has a normal mood and affect.          Assessment & Plan:

## 2015-06-08 NOTE — Assessment & Plan Note (Signed)
Currently on Effexor 150 XR. Overall feeling more anxious, irritable, and had one panic attack. Will start Zoloft 50 mg. 25 mg daily x 6 days, then 50 mg on day 7. Discussed potential side effects. Follow up in 6 weeks for re-eval.

## 2015-06-08 NOTE — Patient Instructions (Signed)
Start Zoloft (sertraline) tablets for anxiety. Start by taking 1/2 tablet by mouth daily for 6 days, then advance to 1 full tablet on day 7.  Continue Effexor.  Follow up in 6 weeks for re-evaluation of anxiety.  It was nice to see you!

## 2015-06-08 NOTE — Assessment & Plan Note (Signed)
Overall improvement with probiotics and omeprazole. She was following with GI and underwent colonoscopy and abdominal ultrasound, no pertinent findings. Will continue to monitor. No complaints today.

## 2015-06-09 ENCOUNTER — Encounter: Payer: Self-pay | Admitting: Family Medicine

## 2015-07-23 ENCOUNTER — Encounter: Payer: Self-pay | Admitting: Primary Care

## 2015-07-23 ENCOUNTER — Ambulatory Visit (INDEPENDENT_AMBULATORY_CARE_PROVIDER_SITE_OTHER): Payer: Managed Care, Other (non HMO) | Admitting: Primary Care

## 2015-07-23 ENCOUNTER — Ambulatory Visit: Payer: Managed Care, Other (non HMO) | Admitting: Primary Care

## 2015-07-23 VITALS — Ht 65.0 in | Wt 170.4 lb

## 2015-07-23 DIAGNOSIS — Z23 Encounter for immunization: Secondary | ICD-10-CM | POA: Diagnosis not present

## 2015-07-23 DIAGNOSIS — F411 Generalized anxiety disorder: Secondary | ICD-10-CM

## 2015-07-23 DIAGNOSIS — J309 Allergic rhinitis, unspecified: Secondary | ICD-10-CM

## 2015-07-23 MED ORDER — LORATADINE 10 MG PO CAPS: 1.0000 | ORAL_CAPSULE | Freq: Every day | ORAL | Status: DC | PRN

## 2015-07-23 NOTE — Patient Instructions (Signed)
Work to reduce carbohydrates in the form of bread, rice, pasta. Increase intake of leafy green vegetables and water.  Start exercising. You need 1 hour of moderate intensity exercise 5 days weekly.  Please schedule a physical with me in the next 3-6 months. You will also schedule a lab only appointment one week prior. We will discuss your lab results during your physical.  It was a pleasure to see you today!

## 2015-07-23 NOTE — Progress Notes (Signed)
Pre visit review using our clinic review tool, if applicable. No additional management support is needed unless otherwise documented below in the visit note. 

## 2015-07-23 NOTE — Assessment & Plan Note (Signed)
Improved with addition of Zoloft. Denies SI/HI, GI upset, headaches. Will continue to monitor. Continue Effexor XR and Zoloft.

## 2015-07-23 NOTE — Progress Notes (Signed)
Subjective:    Patient ID: Martha Page, female    DOB: 08/28/1981, 34 y.o.   MRN: [id number]  HPI  Martha Page is a 34 year old female who presents today for follow up of anxiety. She was evaluated on 06/08/2015 for increased anxiety despite treatment with Effexor XR 150 mg. We initiated Zoloft 50 mg last visit. Since last visit she's not had panic attacks and is feeling less anxious. She continues to have night sweats some nights of the week. She's sleeping with her children at night.   2) Weight gain: Frustrated with weight gain despite efforts to lose. She's replaced her sweet cravings with fruit or yogurt. Her diet consists of:  Breakfast: Cheerios with whole mild. Lunch: Flat bread sandwich, yogurt, left overs (small portions) Dinner: Eats out. Casseroles, lasagna, vegetables (corn, green beans), some salads. Snacks: Veggie straws, pirates booty, yogurt, sugar free pudding. Beverages: Plexis drink in the morning, water with flavoring, diet soda, water  Exercise: She is currently not exercising but is active at home.  Wt Readings from Last 3 Encounters:  07/23/15 170 lb 6.4 oz (77.293 kg)  06/08/15 169 lb 1.9 oz (76.712 kg)  06/01/15 169 lb (76.658 kg)     Review of Systems  Respiratory: Negative for shortness of breath.   Cardiovascular: Negative for chest pain.  Gastrointestinal: Negative for nausea and abdominal pain.  Neurological: Negative for headaches.  Psychiatric/Behavioral: The patient is not nervous/anxious.        Past Medical History  Diagnosis Date  . Frequent headaches   . Migraine   . Shoulder pain   . Elevated liver enzymes   . Anxiety     Social History   Social History  . Marital Status: Single    Spouse Name: N/A  . Number of Children: N/A  . Years of Education: N/A   Occupational History  . Not on file.   Social History Main Topics  . Smoking status: Never Smoker   . Smokeless tobacco: Not on file  . Alcohol Use: No  . Drug Use:  No  . Sexual Activity: Yes   Other Topics Concern  . Not on file   Social History Narrative   Married.   6 children.   Works as a Agricultural engineer.          Past Surgical History  Procedure Laterality Date  . Gallbladder surgery  2002  . Tubal ligation    . Cesarean section    . Diagnostic laparoscopy with removal of ectopic pregnancy    . Hemorrhoid surgery    . Intrauterine device (iud) insertion    . Lasik    . Colonoscopy with propofol N/A 04/17/2015    Procedure: COLONOSCOPY WITH PROPOFOL;  Surgeon: Josefine Class, MD;  Location: [facility] ENDOSCOPY;  Service: Endoscopy;  Laterality: N/A;  . Cholecystectomy  2003  . Cesarean section    . Dilation and curettage of uterus    . Ectopic pregnancy surgery    . Dupuytren contracture release    . Hemorrhoid surgery N/A 06/05/2015    Procedure: HEMORRHOIDECTOMY;  Surgeon: Leonie Green, MD;  Location: [facility] ORS;  Service: General;  Laterality: N/A;    Family History  Problem Relation Age of Onset  . Stroke Mother   . Depression Mother   . Heart disease Father   . Arthritis Paternal Grandmother   . Cancer Paternal Grandmother     lung  . Heart disease Paternal Grandmother   . Stroke Paternal  Grandmother   . Hypertension Paternal Grandmother     Allergies  Allergen Reactions  . Cefazolin Rash and Shortness Of Breath  . Amoxicillin Other (See Comments)  . Codeine Sulfate Itching  . Doxycycline Other (See Comments)    "stomach upset" per pt records  . Sulfa Antibiotics Other (See Comments)    "Stomach pain"  . Penicillin V Potassium Rash    Current Outpatient Prescriptions on File Prior to Visit  Medication Sig Dispense Refill  . sertraline (ZOLOFT) 50 MG tablet Take 1 tablet (50 mg total) by mouth daily. 30 tablet 3  . venlafaxine XR (EFFEXOR-XR) 150 MG 24 hr capsule   11   No current facility-administered medications on file prior to visit.    Ht 5\' 5"  (1.651 m)  Wt 170 lb 6.4 oz (77.293 kg)  BMI 28.36  kg/m2    Objective:   Physical Exam  Constitutional: She is oriented to person, place, and time. She appears well-nourished.  Cardiovascular: Normal rate and regular rhythm.   Pulmonary/Chest: Effort normal and breath sounds normal.  Neurological: She is alert and oriented to person, place, and time.  Skin: Skin is warm and dry.  Psychiatric: She has a normal mood and affect.          Assessment & Plan:

## 2015-08-25 ENCOUNTER — Telehealth: Payer: Self-pay | Admitting: Primary Care

## 2015-08-25 ENCOUNTER — Encounter: Payer: Self-pay | Admitting: *Deleted

## 2015-08-25 NOTE — Telephone Encounter (Signed)
Pt called in needing proof of flu shot. She needs it faxed to [phone] cb number is [phone] ext [phone] Thank you

## 2015-08-25 NOTE — Telephone Encounter (Signed)
Called patient and notified her that sent letter showing patient had the flut shot on 07/23/2015.  Sent to [facility]) at [phone]

## 2015-09-18 LAB — HM MAMMOGRAPHY: HM MAMMO: NORMAL

## 2015-09-23 ENCOUNTER — Telehealth: Payer: Self-pay

## 2015-09-23 NOTE — Telephone Encounter (Signed)
Called and left patient a detail message as per DPR of the comments below also for patient to call back if have questions or concerns.

## 2015-09-23 NOTE — Telephone Encounter (Signed)
Pt left v/m; pt having wisdom teeth removed on 09/25/15; not being put to sleep;using numbing med and gas and pt is very anxious and request med for anxiety prior to appt on 09/25/15. [facility]. Pt request cb when called in.

## 2015-09-23 NOTE — Telephone Encounter (Signed)
Discussed with Dr. Damita Dunnings. She should not use Xanax in combination with the "laughing gas" used at the dentist office. I understand she is anxious prior to her appt, but she should try deep breathing or distraction to try to get through until her appt.

## 2015-10-06 ENCOUNTER — Other Ambulatory Visit: Payer: Self-pay | Admitting: Primary Care

## 2015-10-06 NOTE — Telephone Encounter (Signed)
Electronically refill request for   sertraline (ZOLOFT) 50 MG tablet   Take 1 tablet (50 mg total) by mouth daily.  Dispense: 30 tablet   Refills: 3     Last prescribed on 06/08/2015. Last seen on 07/23/2015. CPE on 10/22/2015.

## 2015-10-13 ENCOUNTER — Other Ambulatory Visit: Payer: Self-pay | Admitting: Primary Care

## 2015-10-13 DIAGNOSIS — Z0000 Encounter for general adult medical examination without abnormal findings: Secondary | ICD-10-CM

## 2015-10-16 ENCOUNTER — Other Ambulatory Visit (INDEPENDENT_AMBULATORY_CARE_PROVIDER_SITE_OTHER): Payer: Managed Care, Other (non HMO)

## 2015-10-16 DIAGNOSIS — Z0000 Encounter for general adult medical examination without abnormal findings: Secondary | ICD-10-CM | POA: Diagnosis not present

## 2015-10-16 LAB — LIPID PANEL
Cholesterol: 148 mg/dL (ref 0–200)
HDL: 44.7 mg/dL (ref >39.00)
LDL CALC: 90 mg/dL (ref 0–99)
NONHDL: 103.61
TRIGLYCERIDES: 67 mg/dL (ref 0.0–149.0)
Total CHOL/HDL Ratio: 3
VLDL: 13.4 mg/dL (ref 0.0–40.0)

## 2015-10-16 LAB — COMPREHENSIVE METABOLIC PANEL
ALT: 82 U/L — AB (ref 0–35)
AST: 30 U/L (ref 0–37)
Albumin: 4.2 g/dL (ref 3.5–5.2)
Alkaline Phosphatase: 132 U/L — ABNORMAL HIGH (ref 39–117)
BILIRUBIN TOTAL: 0.5 mg/dL (ref 0.2–1.2)
BUN: 11 mg/dL (ref 6–23)
CALCIUM: 9.3 mg/dL (ref 8.4–10.5)
CHLORIDE: 107 meq/L (ref 96–112)
CO2: 24 meq/L (ref 19–32)
Creatinine, Ser: 0.72 mg/dL (ref 0.40–1.20)
GFR: 96.98 mL/min (ref >60.00)
GLUCOSE: 90 mg/dL (ref 70–99)
Potassium: 3.7 mEq/L (ref 3.5–5.1)
Sodium: 139 mEq/L (ref 135–145)
Total Protein: 7.6 g/dL (ref 6.0–8.3)

## 2015-10-16 LAB — HEMOGLOBIN A1C: HEMOGLOBIN A1C: 5.3 % (ref 4.6–6.5)

## 2015-10-16 LAB — VITAMIN D 25 HYDROXY (VIT D DEFICIENCY, FRACTURES): VITD: 20.1 ng/mL — ABNORMAL LOW (ref 30.00–100.00)

## 2015-10-16 LAB — TSH: TSH: 1.06 u[IU]/mL (ref 0.35–4.50)

## 2015-10-22 ENCOUNTER — Ambulatory Visit (INDEPENDENT_AMBULATORY_CARE_PROVIDER_SITE_OTHER): Payer: Managed Care, Other (non HMO) | Admitting: Primary Care

## 2015-10-22 ENCOUNTER — Encounter: Payer: Self-pay | Admitting: Primary Care

## 2015-10-22 ENCOUNTER — Telehealth: Payer: Self-pay

## 2015-10-22 ENCOUNTER — Other Ambulatory Visit: Payer: Self-pay | Admitting: Primary Care

## 2015-10-22 VITALS — BP 116/76 | HR 100 | Temp 98.0°F | Ht 65.0 in | Wt 154.0 lb

## 2015-10-22 DIAGNOSIS — K121 Other forms of stomatitis: Secondary | ICD-10-CM | POA: Diagnosis not present

## 2015-10-22 DIAGNOSIS — Z0000 Encounter for general adult medical examination without abnormal findings: Secondary | ICD-10-CM | POA: Insufficient documentation

## 2015-10-22 DIAGNOSIS — F411 Generalized anxiety disorder: Secondary | ICD-10-CM

## 2015-10-22 DIAGNOSIS — E559 Vitamin D deficiency, unspecified: Secondary | ICD-10-CM | POA: Insufficient documentation

## 2015-10-22 MED ORDER — CLOBETASOL PROPIONATE 0.05 % EX CREA: TOPICAL_CREAM | CUTANEOUS | Status: DC

## 2015-10-22 MED ORDER — VENLAFAXINE HCL ER 37.5 MG PO CP24
37.5000 mg | ORAL_CAPSULE | Freq: Every day | ORAL | Status: DC
Start: 2015-10-22 — End: 2015-11-10

## 2015-10-22 MED ORDER — VITAMIN D (ERGOCALCIFEROL) 1.25 MG (50000 UNIT) PO CAPS: ORAL_CAPSULE | ORAL | Status: DC

## 2015-10-22 NOTE — Assessment & Plan Note (Signed)
Tdap, pap, influenza, mammogram, colonoscopy UTD. Weight loss of 16 pounds since last visit as she's improved her diet. Discussed importance of exercise. She will be seeing GI today for follow up of episodes this summer. Exam unremarkable, vitamin D deficiency noted on labs, otherwise unremarkable.  Follow up in 1 year for repeat physical

## 2015-10-22 NOTE — Patient Instructions (Signed)
Start weaning off Sertraline 50 mg. Take 1 tablet every other day for 2 weeks.  We've increased your Effexor to 187.5 mg. Take both capsules by mouth everyday. Please call me in 4 weeks for an update on your anxiety.  Use the clobetasol cream twice daily to affected area for 2 weeks max.  Please keep me updated with your visit to GI.  Please schedule a follow up appointment in 6 months.   It was a pleasure to see you today!

## 2015-10-22 NOTE — Progress Notes (Signed)
Subjective:    Patient ID: Martha Page, female    DOB: [birth year], 34 y.o.   MRN: [id number]  HPI  Martha Page is a 34 year old female who presents today for complete physical.  Immunizations: -Tetanus: Completed in 2015 -Influenza: Completed in September    Diet: Endorses a healthy diet. Breakfast: Oatmeal, belvita crackers, cherrios Lunch: [place] sandwich, veggies straws, yogurt, quaker popped chips Dinner: Cherrios, meat, veggies, pasta. Snacks: Popped chips, rice cakes. Desserts: None Beverages: Water  Exercise: She is not currently exercising.  Eye exam: Completed 2 years ago. Had lasik procedure Dental exam: Completed 2 months ago. Colonoscopy: Completed in June 2016 Pap Smear: Completed in 2016 Mammogram: Completed in 2016, normal.  Wt Readings from Last 3 Encounters:  10/22/15 154 lb (69.854 kg)  07/23/15 170 lb 6.4 oz (77.293 kg)  06/08/15 169 lb 1.9 oz (76.712 kg)    1) Oral Sore: Located to left upper lip intermittently for several months. No improvement with OTC treatment. Dentist recommended L-Lysine PO which has not helped to resolve. Denies bleeding, drainage. Overall her sore has not improved.  2) GAD: Increased anxiety and irritability over the past several months. She is currently managed on Zoloft 50 mg and Effexor XR 150 mg. She also reports night sweats. She doesn't feel any different with Zoloft and believes it not to be working. Denies SI/HI.  Review of Systems  HENT: Negative for rhinorrhea.   Respiratory: Negative for cough.   Cardiovascular: Negative for chest pain.  Gastrointestinal: Negative for nausea and vomiting.       Will be seeing Dr. Thurmond Butts again through GI today. She's experiencing her "stomach" attacks. Numbness, abdominal pain, diaphoresis. Taking stool softeners, benefiber, probiotic. She is having bowel movements irregularly.   Genitourinary: Negative for difficulty urinating.  Musculoskeletal: Negative for myalgias and  arthralgias.  Skin: Negative for rash.  Neurological: Positive for numbness. Negative for dizziness and headaches.       Past Medical History  Diagnosis Date  . Frequent headaches   . Migraine   . Shoulder pain   . Elevated liver enzymes   . Anxiety     Social History   Social History  . Marital Status: Single    Spouse Name: N/A  . Number of Children: N/A  . Years of Education: N/A   Occupational History  . Not on file.   Social History Main Topics  . Smoking status: Never Smoker   . Smokeless tobacco: Not on file  . Alcohol Use: No  . Drug Use: No  . Sexual Activity: Yes   Other Topics Concern  . Not on file   Social History Narrative   Married.   6 children.   Works as a Agricultural engineer.          Past Surgical History  Procedure Laterality Date  . Gallbladder surgery  2002  . Tubal ligation    . Cesarean section    . Diagnostic laparoscopy with removal of ectopic pregnancy    . Hemorrhoid surgery    . Intrauterine device (iud) insertion    . Lasik    . Colonoscopy with propofol N/A 04/17/2015    Procedure: COLONOSCOPY WITH PROPOFOL;  Surgeon: Josefine Class, MD;  Location: [facility] ENDOSCOPY;  Service: Endoscopy;  Laterality: N/A;  . Cholecystectomy  2003  . Cesarean section    . Dilation and curettage of uterus    . Ectopic pregnancy surgery    . Dupuytren contracture release    .  Hemorrhoid surgery N/A 06/05/2015    Procedure: HEMORRHOIDECTOMY;  Surgeon: Leonie Green, MD;  Location: [facility] ORS;  Service: General;  Laterality: N/A;    Family History  Problem Relation Age of Onset  . Stroke Mother   . Depression Mother   . Heart disease Father   . Arthritis Paternal Grandmother   . Cancer Paternal Grandmother     lung  . Heart disease Paternal Grandmother   . Stroke Paternal Grandmother   . Hypertension Paternal Grandmother     Allergies  Allergen Reactions  . Cefazolin Rash and Shortness Of Breath  . Amoxicillin Other (See Comments)    . Codeine Sulfate Itching  . Doxycycline Other (See Comments)    "stomach upset" per pt records  . Sulfa Antibiotics Other (See Comments)    "Stomach pain"  . Penicillin V Potassium Rash    Current Outpatient Prescriptions on File Prior to Visit  Medication Sig Dispense Refill  . Loratadine (CLARITIN) 10 MG CAPS Take 1 capsule (10 mg total) by mouth daily as needed. 30 each 5  . venlafaxine XR (EFFEXOR-XR) 150 MG 24 hr capsule Take 150 mg by mouth daily with breakfast.   11   No current facility-administered medications on file prior to visit.    BP 116/76 mmHg  Pulse 100  Temp(Src) 98 F (36.7 C) (Oral)  Ht 5\' 5"  (1.651 m)  Wt 154 lb (69.854 kg)  BMI 25.63 kg/m2  SpO2 99%    Objective:   Physical Exam  Constitutional: She is oriented to person, place, and time. She appears well-nourished.  HENT:  Right Ear: Tympanic membrane and ear canal normal.  Left Ear: Tympanic membrane and ear canal normal.  Nose: Nose normal.  Mouth/Throat: Oropharynx is clear and moist.  Eyes: Conjunctivae and EOM are normal. Pupils are equal, round, and reactive to light.  Neck: Neck supple. No thyromegaly present.  Cardiovascular: Normal rate and regular rhythm.   Pulmonary/Chest: Effort normal and breath sounds normal.  Abdominal: Soft. Bowel sounds are normal. There is no tenderness.  Musculoskeletal: Normal range of motion.  Lymphadenopathy:    She has no cervical adenopathy.  Neurological: She is alert and oriented to person, place, and time. She has normal reflexes. No cranial nerve deficit.  Skin: Skin is warm and dry.  Psychiatric: She has a normal mood and affect.          Assessment & Plan:

## 2015-10-22 NOTE — Progress Notes (Signed)
Pre visit review using our clinic review tool, if applicable. No additional management support is needed unless otherwise documented below in the visit note. 

## 2015-10-22 NOTE — Telephone Encounter (Signed)
Called patient and notified her Vitamin D has been sent to pharmacy. Lab appt has been schedule on 01/20/2016.

## 2015-10-22 NOTE — Telephone Encounter (Signed)
Pt left v/m; pt was seen earlier today and pt thought Vit D rx was going to be sent to [facility]. Pt request cb when med sent to pharmacy.

## 2015-10-22 NOTE — Assessment & Plan Note (Signed)
Located to left upper lip intermittently for several months.  Recently increased in size. No relief with OTC treatment and l-lysine. Will send RX for clobetasol cream to apply BID x 2 weeks. She is to notify me of progress.

## 2015-10-22 NOTE — Telephone Encounter (Signed)
Noted. RX sent into pharmacy. Vallarie Mare, will you schedule a 3 month lab only appointment to check vitamin D?

## 2015-10-22 NOTE — Assessment & Plan Note (Signed)
Doesn't feel as though Zoloft is helping, also experiencing night sweats. Increased anxiety and irritability. Will slowly wean off Zoloft and increase Effexor to 187.5 mg. Instructions provided for weaning.  She is to update [place] in 1 month.

## 2015-10-22 NOTE — Assessment & Plan Note (Signed)
Vitamin d level of 20 from recent labs. Will initiate 50,000 units once weekly for 12 weeks. Repeat labs in 3 months.

## 2015-10-23 NOTE — Addendum Note (Signed)
Addended by: Ellamae Sia on: 10/23/2015 12:55 PM   Modules accepted: Orders

## 2015-11-09 ENCOUNTER — Encounter: Payer: Self-pay | Admitting: Primary Care

## 2015-11-10 ENCOUNTER — Other Ambulatory Visit: Payer: Self-pay | Admitting: Primary Care

## 2015-11-10 ENCOUNTER — Encounter: Payer: Self-pay | Admitting: Primary Care

## 2015-11-10 DIAGNOSIS — F411 Generalized anxiety disorder: Secondary | ICD-10-CM

## 2015-11-10 MED ORDER — BUSPIRONE HCL 7.5 MG PO TABS: 7.5000 mg | ORAL_TABLET | Freq: Two times a day (BID) | ORAL | Status: DC

## 2015-11-23 ENCOUNTER — Telehealth: Payer: Self-pay | Admitting: Primary Care

## 2015-11-23 ENCOUNTER — Encounter: Payer: Self-pay | Admitting: Primary Care

## 2015-12-21 ENCOUNTER — Other Ambulatory Visit: Payer: Self-pay | Admitting: Primary Care

## 2015-12-23 ENCOUNTER — Encounter: Payer: Self-pay | Admitting: Primary Care

## 2015-12-29 ENCOUNTER — Other Ambulatory Visit: Payer: Self-pay | Admitting: Primary Care

## 2015-12-31 ENCOUNTER — Other Ambulatory Visit: Payer: Self-pay | Admitting: Primary Care

## 2015-12-31 DIAGNOSIS — F411 Generalized anxiety disorder: Secondary | ICD-10-CM

## 2016-01-01 NOTE — Telephone Encounter (Signed)
Electronically refill request for   busPIRone (BUSPAR) 7.5 MG tablet   Take 1 tablet (7.5 mg total) by mouth 2 (two) times daily.  Dispense: 60 tablet   Refills: 1     Last prescribed on 11/10/2015. Last seen on 10/22/2015. Follow up on 04/21/2016

## 2016-01-07 ENCOUNTER — Encounter: Payer: Self-pay | Admitting: Primary Care

## 2016-01-15 ENCOUNTER — Other Ambulatory Visit: Payer: Self-pay | Admitting: Primary Care

## 2016-01-15 DIAGNOSIS — E559 Vitamin D deficiency, unspecified: Secondary | ICD-10-CM

## 2016-01-18 ENCOUNTER — Other Ambulatory Visit: Payer: Self-pay | Admitting: Primary Care

## 2016-01-18 DIAGNOSIS — F411 Generalized anxiety disorder: Secondary | ICD-10-CM

## 2016-01-18 MED ORDER — BUSPIRONE HCL 7.5 MG PO TABS: 7.5000 mg | ORAL_TABLET | Freq: Two times a day (BID) | ORAL | Status: DC

## 2016-01-20 ENCOUNTER — Other Ambulatory Visit: Payer: Managed Care, Other (non HMO)

## 2016-01-22 ENCOUNTER — Encounter: Payer: Self-pay | Admitting: Primary Care

## 2016-02-03 ENCOUNTER — Other Ambulatory Visit: Payer: Self-pay | Admitting: Primary Care

## 2016-02-16 ENCOUNTER — Other Ambulatory Visit: Payer: Self-pay | Admitting: Obstetrics and Gynecology

## 2016-02-29 DIAGNOSIS — R42 Dizziness and giddiness: Secondary | ICD-10-CM | POA: Insufficient documentation

## 2016-02-29 DIAGNOSIS — R55 Syncope and collapse: Secondary | ICD-10-CM

## 2016-03-11 ENCOUNTER — Encounter: Payer: Self-pay | Admitting: Primary Care

## 2016-03-31 ENCOUNTER — Encounter: Payer: Self-pay | Admitting: Primary Care

## 2016-04-01 ENCOUNTER — Ambulatory Visit (INDEPENDENT_AMBULATORY_CARE_PROVIDER_SITE_OTHER): Payer: Managed Care, Other (non HMO) | Admitting: Primary Care

## 2016-04-01 ENCOUNTER — Encounter: Payer: Self-pay | Admitting: Primary Care

## 2016-04-01 VITALS — BP 116/76 | HR 91 | Temp 98.3°F | Ht 65.0 in | Wt 164.4 lb

## 2016-04-01 DIAGNOSIS — F411 Generalized anxiety disorder: Secondary | ICD-10-CM | POA: Diagnosis not present

## 2016-04-01 MED ORDER — BUSPIRONE HCL 15 MG PO TABS: 15.0000 mg | ORAL_TABLET | Freq: Two times a day (BID) | ORAL | Status: DC

## 2016-04-01 NOTE — Patient Instructions (Signed)
Start Buspar 15 mg tablets. Take 1 tablet by mouth twice daily. You may take 2 of the 7.5 mg tablets twice daily until your current bottle is empty.  Try switching from loratadine to cetrizine (Zyrtec). This may be purchased over the counter and will help with drainage.  Please e-mail me in 3 weeks with an update on the increased dose of the Buspar.  It was a pleasure to see you today!

## 2016-04-01 NOTE — Assessment & Plan Note (Signed)
Overall improved on Buspar, but not quite at goal. Weaned off Effexor and Zoloft in 2016/early 2017. Will increase Buspar to 15 mg BID. She is to call with an update in 4 weeks. Denies SI/HI.

## 2016-04-01 NOTE — Progress Notes (Signed)
Pre visit review using our clinic review tool, if applicable. No additional management support is needed unless otherwise documented below in the visit note. 

## 2016-04-01 NOTE — Progress Notes (Signed)
Subjective:    Patient ID: Martha Page, female    DOB: [birth year], 35 y.o.   MRN: [id number]  HPI  Martha Page is a 35 year old female who presents today to discuss anxiety. She has a history of Generalized Anxiety Disorder and once managed on Zoloft and Effexor. She slowly weaned off the Effexor and Zoloft in early January 2017. She is currently taking Buspar 7.5 mg twice daily.  Since her last visit she's felt improved on the Buspar, but over the past several weeks she's noticed dizziness/disorientation within 1 hour after taking her medication. This is they way she feels when she's not had her medication. She feels as though the medication isn't working as well as it did when she initially began taking it as she's noticed an increase in her anxiety over the past several weeks. Denies SI/HI.   Review of Systems  Respiratory: Negative for shortness of breath.   Cardiovascular: Negative for chest pain.  Psychiatric/Behavioral: Positive for sleep disturbance. The patient is nervous/anxious.        Past Medical History  Diagnosis Date  . Frequent headaches   . Migraine   . Shoulder pain   . Elevated liver enzymes   . Anxiety      Social History   Social History  . Marital Status: Single    Spouse Name: N/A  . Number of Children: N/A  . Years of Education: N/A   Occupational History  . Not on file.   Social History Main Topics  . Smoking status: Never Smoker   . Smokeless tobacco: Not on file  . Alcohol Use: No  . Drug Use: No  . Sexual Activity: Yes   Other Topics Concern  . Not on file   Social History Narrative   Married.   6 children.   Works as a Agricultural engineer.          Past Surgical History  Procedure Laterality Date  . Gallbladder surgery  2002  . Tubal ligation    . Cesarean section    . Diagnostic laparoscopy with removal of ectopic pregnancy    . Hemorrhoid surgery    . Intrauterine device (iud) insertion    . Lasik    . Colonoscopy with  propofol N/A 04/17/2015    Procedure: COLONOSCOPY WITH PROPOFOL;  Surgeon: Josefine Class, MD;  Location: [facility] ENDOSCOPY;  Service: Endoscopy;  Laterality: N/A;  . Cholecystectomy  2003  . Cesarean section    . Dilation and curettage of uterus    . Ectopic pregnancy surgery    . Dupuytren contracture release    . Hemorrhoid surgery N/A 06/05/2015    Procedure: HEMORRHOIDECTOMY;  Surgeon: Leonie Green, MD;  Location: [facility] ORS;  Service: General;  Laterality: N/A;    Family History  Problem Relation Age of Onset  . Stroke Mother   . Depression Mother   . Heart disease Father   . Arthritis Paternal Grandmother   . Cancer Paternal Grandmother     lung  . Heart disease Paternal Grandmother   . Stroke Paternal Grandmother   . Hypertension Paternal Grandmother     Allergies  Allergen Reactions  . Cefazolin Rash and Shortness Of Breath  . Amoxicillin Other (See Comments)  . Codeine Sulfate Itching  . Doxycycline Other (See Comments)    "stomach upset" per pt records  . Sulfa Antibiotics Other (See Comments)    "Stomach pain"  . Penicillin V Potassium Rash    Current Outpatient  Prescriptions on File Prior to Visit  Medication Sig Dispense Refill  . clobetasol cream (TEMOVATE) 0.05 % Apply to affected Page twice daily for 2 weeks. 15 g 0  . loratadine (CLARITIN) 10 MG tablet TAKE 1 TABLET BY MOUTH DAILY AS NEEDED 30 tablet 5  . Multiple Vitamins-Minerals (MULTIVITAL) CHEW 2 Chew at bedtime    . senna (SENOKOT) 8.6 MG tablet Take 1 tablet by mouth 2 (two) times daily.     No current facility-administered medications on file prior to visit.    BP 116/76 mmHg  Pulse 91  Temp(Src) 98.3 F (36.8 C) (Oral)  Ht 5\' 5"  (1.651 m)  Wt 164 lb 6.4 oz (74.571 kg)  BMI 27.36 kg/m2  SpO2 99%    Objective:   Physical Exam  Constitutional: She appears well-nourished.  Cardiovascular: Normal rate and regular rhythm.   Pulmonary/Chest: Effort normal and breath sounds normal.    Skin: Skin is warm and dry.  Psychiatric: She has a normal mood and affect.          Assessment & Plan:

## 2016-04-14 ENCOUNTER — Encounter: Payer: Self-pay | Admitting: Primary Care

## 2016-04-20 ENCOUNTER — Other Ambulatory Visit: Payer: Self-pay | Admitting: Primary Care

## 2016-04-20 DIAGNOSIS — R232 Flushing: Secondary | ICD-10-CM

## 2016-04-20 MED ORDER — GABAPENTIN 100 MG PO CAPS: ORAL_CAPSULE | ORAL | Status: DC

## 2016-04-21 ENCOUNTER — Ambulatory Visit: Payer: Managed Care, Other (non HMO) | Admitting: Primary Care

## 2016-05-19 ENCOUNTER — Encounter: Payer: Self-pay | Admitting: Primary Care

## 2016-05-20 ENCOUNTER — Telehealth: Payer: Self-pay | Admitting: Primary Care

## 2016-05-20 NOTE — Telephone Encounter (Signed)
Noted  

## 2016-05-20 NOTE — Telephone Encounter (Signed)
Message left for patient to return my call.  

## 2016-05-20 NOTE — Telephone Encounter (Signed)
I received an unfortunate email from Martha Page as she is struggling from the recent loss of her step father. Please find out if she is suicidal. If she is, then she need to go to the emergency department immediately. If she is not, then if she does experience SI/HI symptoms over the weekend, or her anxiety/depression becomes worse, then she needs to go to the emergency department.  I need to see her in the office for this. Please schedule her for a 30 minute visit at her earliest convenience next week. Thanks.

## 2016-05-20 NOTE — Telephone Encounter (Signed)
Pt advised. She states she isn't having suicidal thoughts, but will go to the ED if she begins to or her anxiety/depression worsenes. She states that she wants to wait and see how she does over the weekend, and will call next week for an appointment she thinks it's needed.

## 2016-06-01 ENCOUNTER — Ambulatory Visit (INDEPENDENT_AMBULATORY_CARE_PROVIDER_SITE_OTHER): Payer: Managed Care, Other (non HMO) | Admitting: Family Medicine

## 2016-06-01 ENCOUNTER — Telehealth: Payer: Self-pay | Admitting: Primary Care

## 2016-06-01 DIAGNOSIS — L509 Urticaria, unspecified: Secondary | ICD-10-CM | POA: Insufficient documentation

## 2016-06-01 MED ORDER — PREDNISONE 50 MG PO TABS: 50.0000 mg | ORAL_TABLET | Freq: Every day | ORAL | 0 refills | Status: DC

## 2016-06-01 NOTE — Telephone Encounter (Signed)
Noted thank you

## 2016-06-01 NOTE — Telephone Encounter (Signed)
This is your 1015 this am. thanks

## 2016-06-01 NOTE — Progress Notes (Signed)
  Tommi Rumps, MD Phone: [phone]  Martha Page is a 35 y.o. female who presents today for same-day visit.  Rash: Patient notes onset of rash Monday night. Developed large wheels on her posterior legs with some smaller papules surrounding them. Notes over the last 2 days has started to spread and now has rash on her face, back, neck, legs, and feet. Benadryl was unhelpful. Xyzal was unhelpful. Zyrtec was unhelpful. Notes significant itching with this. The only difference in the last week has been that she ate red icing on Sunday and wonders if this could be related to red dye. Her detergents and soaps are the same. No new medications. No fevers. No eye, mouth, or vaginal involvement. No contacts with the rash. No difficulty breathing, lip swelling, tongue swelling, or throat swelling. No prior history of hives.  PMH: Denies history of hives.   ROS see history of present illness  Objective  Physical Exam Vitals:   06/01/16 1019  BP: 112/76  Pulse: 97  Temp: 97.9 F (36.6 C)    BP Readings from Last 3 Encounters:  06/01/16 112/76  04/01/16 116/76  10/22/15 116/76   Wt Readings from Last 3 Encounters:  06/01/16 170 lb (77.1 kg)  04/01/16 164 lb 6.4 oz (74.6 kg)  10/22/15 154 lb (69.9 kg)    Physical Exam  Constitutional: No distress.  HENT:  Head: Normocephalic and atraumatic.  Cardiovascular: Normal rate, regular rhythm and normal heart sounds.   Skin: She is not diaphoretic.     Assessment/Plan: Please see individual problem list.  Hives Patient with hives that are relatively widespread over her body. Only potential known exposure would be red dye. This has been added to her allergy list though she is unsure if this is the cause. There was a trigger that gabapentin has some cross-reactivity with this and I discussed this with her though she wants to continue her gabapentin see if it is an issue in the future as she notes she's been on gabapentin for a long  time with no issues. We will treat her acute issue with prednisone. She will monitor for involvement of her eyes, mouth, and vagina. She will monitor for anaphylactic-like reaction. She is given return precautions.   No orders of the defined types were placed in this encounter.   Meds ordered this encounter  Medications  . DISCONTD: predniSONE (DELTASONE) 50 MG tablet    Sig: Take 1 tablet (50 mg total) by mouth daily.    Dispense:  5 tablet    Refill:  0  . predniSONE (DELTASONE) 50 MG tablet    Sig: Take 1 tablet (50 mg total) by mouth daily.    Dispense:  5 tablet    Refill:  0   Tommi Rumps, MD [place]

## 2016-06-01 NOTE — Telephone Encounter (Signed)
[facility] Patient Name: PATTON WERBER DOB: 01-17-1981 Initial Comment has rash all over body Nurse Assessment Nurse: Markus Daft, RN, Sherre Poot Date/Time (Eastern Time): 06/01/2016 9:06:51 AM Confirm and document reason for call. If symptomatic, describe symptoms. You must click the next button to save text entered. ---Caller states that she has hives/welts/itching all over. She had a cupcake with red icing on Sunday night. Started itching on backs of her legs on Monday. Then yesterday worse and then 3 am woke up with severe itching despite taking OTC antihistamines. 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 Has the patient traveled out of the country within the last 30 days? ---Not Applicable Does the patient have any new or worsening symptoms? ---Yes Will a triage be completed? ---Yes Related visit to physician within the last 2 weeks? ---No Does the PT have any chronic conditions? (i.e. diabetes, asthma, etc.) ---No Is the patient pregnant or possibly pregnant? (Ask all females between the ages of 12-55) ---No Is this a behavioral health or substance abuse call? ---No Guidelines Guideline Title Affirmed Question Affirmed Notes Hives [1] MODERATE-SEVERE hives persist (i.e., hives interfere with normal activities or work) AND [2] taking antihistamine (e.g., Benadryl, Claritin) > 24 hours Final Disposition User See Physician within [place], RN, [place] Comments Alma Friendly has no available appts and no other available appts at [facility]. Appt made at 10:15 am with Tommi Rumps at [facility] office. Referrals REFERRED TO PCP OFFICE Disagree/Comply: Comply

## 2016-06-01 NOTE — Patient Instructions (Signed)
Nice to meet you. Your rash is likely related to allergic reaction. I am unsure what you have reacted to. We will treat you with prednisone down with itching and the rash. If you develop worsening rash, fevers, involvement of your eyes mouth or vagina, tongue swelling, lip swelling, throat swelling, trouble breathing, or any new or changing symptoms please seek medical attention immediately

## 2016-06-01 NOTE — Assessment & Plan Note (Signed)
Patient with hives that are relatively widespread over her body. Only potential known exposure would be red dye. This has been added to her allergy list though she is unsure if this is the cause. There was a trigger that gabapentin has some cross-reactivity with this and I discussed this with her though she wants to continue her gabapentin see if it is an issue in the future as she notes she's been on gabapentin for a long time with no issues. We will treat her acute issue with prednisone. She will monitor for involvement of her eyes, mouth, and vagina. She will monitor for anaphylactic-like reaction. She is given return precautions.

## 2016-06-30 ENCOUNTER — Encounter: Payer: Self-pay | Admitting: Internal Medicine

## 2016-06-30 ENCOUNTER — Ambulatory Visit
Admission: RE | Admit: 2016-06-30 | Discharge: 2016-06-30 | Disposition: A | Discharge status: Home / Self Care | Payer: Managed Care, Other (non HMO) | Source: Ambulatory Visit | Attending: Internal Medicine | Admitting: Internal Medicine

## 2016-06-30 ENCOUNTER — Ambulatory Visit (INDEPENDENT_AMBULATORY_CARE_PROVIDER_SITE_OTHER): Payer: Managed Care, Other (non HMO) | Admitting: Internal Medicine

## 2016-06-30 ENCOUNTER — Other Ambulatory Visit
Admission: RE | Admit: 2016-06-30 | Discharge: 2016-06-30 | Disposition: A | Discharge status: Home / Self Care | Payer: Managed Care, Other (non HMO) | Source: Ambulatory Visit | Attending: Internal Medicine | Admitting: Internal Medicine

## 2016-06-30 VITALS — BP 112/78 | HR 102 | Ht 65.0 in | Wt 169.0 lb

## 2016-06-30 DIAGNOSIS — R0600 Dyspnea, unspecified: Secondary | ICD-10-CM | POA: Diagnosis not present

## 2016-06-30 DIAGNOSIS — J189 Pneumonia, unspecified organism: Secondary | ICD-10-CM

## 2016-06-30 DIAGNOSIS — R918 Other nonspecific abnormal finding of lung field: Secondary | ICD-10-CM | POA: Insufficient documentation

## 2016-06-30 DIAGNOSIS — J209 Acute bronchitis, unspecified: Secondary | ICD-10-CM | POA: Diagnosis not present

## 2016-06-30 HISTORY — DX: Pneumonia, unspecified organism: J18.9

## 2016-06-30 LAB — EXPECTORATED SPUTUM ASSESSMENT W REFEX TO RESP CULTURE

## 2016-06-30 LAB — EXPECTORATED SPUTUM ASSESSMENT W GRAM STAIN, RFLX TO RESP C

## 2016-06-30 MED ORDER — LEVOFLOXACIN 500 MG PO TABS: 500.0000 mg | ORAL_TABLET | Freq: Every day | ORAL | 0 refills | Status: DC

## 2016-06-30 MED ORDER — CETIRIZINE HCL 10 MG PO TABS: 10.0000 mg | ORAL_TABLET | Freq: Every day | ORAL | 5 refills | Status: DC

## 2016-06-30 NOTE — Progress Notes (Signed)
[facility] Consultation      Date: 06/30/2016,   MRN# [id number] Martha Page 11/05/1981 Code Status:  Code Status History    This patient does not have a recorded code status. Please follow your organizational policy for patients in this situation.     Hosp day:@LENGTHOFSTAYDAYS @ Referring MD: @ATDPROV @     PCP:      AdmissionWeight: 169 lb (76.7 kg)                 CurrentWeight: 169 lb (76.7 kg) Martha Page is a 35 y.o. old female seen in consultation for cough/congestion at the request of Dr. Stann Mainland.     CHIEF COMPLAINT:   Cough and congestion   HISTORY OF PRESENT ILLNESS   35 yo white female seen today for cough and chest congestion for approx 1 week +Cough associated with greenish sputum/prouctice cough. Then developed chest tightness and increased WOB  Patient had flu like symptome approx 1 week ago, then subsequently developed productive cough Patient was prescribed a prednisone taper that started at 50 mg then tapering over the next several days, also was given Z pack patient was also given albuterol inhaler which she has been using infrequently  Patient developed more fatigue and increased SOB/increased WOB The cough seems to have improved but continues to have productive cough Patient does not appear to be in distress, she also suffers from chronic allergies and takes antihistamines daily Patient is former smoker, quit 2012 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 Anxiety   . Elevated liver enzymes   . Frequent headaches   . Migraine   . Shoulder pain      SURGICAL HISTORY   Past Surgical History:  Procedure Laterality Date  . CESAREAN SECTION    . CESAREAN SECTION    . CHOLECYSTECTOMY  2003  . COLONOSCOPY WITH PROPOFOL N/A 04/17/2015   Procedure: COLONOSCOPY WITH PROPOFOL;  Surgeon: Josefine Class, MD;  Location:  [facility];  Service: Endoscopy;  Laterality: N/A;  . DIAGNOSTIC LAPAROSCOPY WITH REMOVAL OF ECTOPIC PREGNANCY    . DILATION AND CURETTAGE OF UTERUS    . DUPUYTREN CONTRACTURE RELEASE    . ECTOPIC PREGNANCY SURGERY    . GALLBLADDER SURGERY  2002  . HEMORRHOID SURGERY    . HEMORRHOID SURGERY N/A 06/05/2015   Procedure: HEMORRHOIDECTOMY;  Surgeon: Leonie Green, MD;  Location: [facility] ORS;  Service: General;  Laterality: N/A;  . INTRAUTERINE DEVICE (IUD) INSERTION    . LASIK    . TUBAL LIGATION       FAMILY HISTORY   Family History  Problem Relation Age of Onset  . Stroke Mother   . Depression Mother   . Heart disease Father   . Arthritis Paternal Grandmother   . Cancer Paternal Grandmother     lung  . Heart disease Paternal Grandmother   . Stroke Paternal Grandmother   . Hypertension Paternal Grandmother      SOCIAL HISTORY   Social History  Substance Use Topics  . Smoking status: Former Smoker    Quit date: 07/01/2011  . Smokeless tobacco: Never Used  . Alcohol use No     MEDICATIONS    Home Medication:  Current Outpatient Rx  . Order #: [id number] Class: Historical Med  . Order #: [id number] Class: Normal  . Order #: [id number] Class: Normal  . Order #:  [id number] Class: Historical Med  . Order #: [id number] Class: Normal  . Order #: [id number] Class: Normal  . Order #: [id number] Class: Historical Med  . Order #: [id number] Class: Historical Med  . Order #: [id number] Class: Historical Med  . Order #: [id number] Class: Normal  . Order #: [id number] Class: Historical Med  . Order #: [id number] Class: Historical Med    Current Medication:  Current Outpatient Prescriptions:  .  albuterol (PROVENTIL HFA;VENTOLIN HFA) 108 (90 Base) MCG/ACT inhaler, Inhale 2 puffs into the lungs every 6 (six) hours as needed for wheezing or shortness of breath., Disp: , Rfl:  .  busPIRone (BUSPAR) 15 MG tablet, Take 1 tablet (15 mg total) by mouth 2 (two) times daily., Disp: 60 tablet, Rfl:  3 .  clobetasol cream (TEMOVATE) 0.05 %, Apply to affected area twice daily for 2 weeks., Disp: 15 g, Rfl: 0 .  Doxycycline Hyclate (DORYX MPC) 120 MG TBEC, Take 120 mg by mouth daily., Disp: , Rfl:  .  gabapentin (NEURONTIN) 100 MG capsule, Take 1 to 2 capsules at bedtime for hot flashes., Disp: 60 capsule, Rfl: 2 .  loratadine (CLARITIN) 10 MG tablet, TAKE 1 TABLET BY MOUTH DAILY AS NEEDED, Disp: 30 tablet, Rfl: 5 .  Melatonin 10 MG TABS, Take 1 tablet by mouth at bedtime., Disp: , Rfl:  .  mirabegron ER (MYRBETRIQ) 25 MG TB24 tablet, Take 25 mg by mouth daily., Disp: , Rfl:  .  Multiple Vitamins-Minerals (MULTIVITAL) CHEW, 2 Chew at bedtime, Disp: , Rfl:  .  predniSONE (DELTASONE) 50 MG tablet, Take 1 tablet (50 mg total) by mouth daily., Disp: 5 tablet, Rfl: 0 .  senna (SENOKOT) 8.6 MG tablet, Take 1 tablet by mouth 2 (two) times daily., Disp: , Rfl:  .  venlafaxine XR (EFFEXOR-XR) 150 MG 24 hr capsule, Take 150 mg by mouth daily with breakfast., Disp: , Rfl:     ALLERGIES   Cefazolin; Amoxicillin; Codeine sulfate; Doxycycline; Red dye; Sulfa antibiotics; and Penicillin v potassium     REVIEW OF SYSTEMS   Review of Systems  Constitutional: Positive for fever and malaise/fatigue. Negative for chills, diaphoresis and weight loss.  HENT: Positive for congestion. Negative for hearing loss.   Eyes: Negative for blurred vision and double vision.  Respiratory: Positive for cough, sputum production, shortness of breath and wheezing. Negative for hemoptysis.   Cardiovascular: Negative for chest pain, palpitations and orthopnea.  Gastrointestinal: Negative for abdominal pain, heartburn, nausea and vomiting.  Genitourinary: Negative for dysuria and urgency.  Musculoskeletal: Positive for myalgias. Negative for back pain and neck pain.  Skin: Negative for rash.  Neurological: Negative for dizziness, tingling, tremors, weakness and headaches.  Endo/Heme/Allergies: Does not bruise/bleed  easily.  Psychiatric/Behavioral: Negative for depression, substance abuse and suicidal ideas.  All other systems reviewed and are negative.    VS: BP 112/78 (BP Location: Left Arm, Cuff Size: Normal)   Pulse (!) 102   Ht 5\' 5"  (1.651 m)   Wt 169 lb (76.7 kg)   SpO2 97%   BMI 28.12 kg/m      PHYSICAL EXAM  Physical Exam  Constitutional: She is oriented to person, place, and time. She appears well-developed and well-nourished. No distress.  HENT:  Head: Normocephalic and atraumatic.  Mouth/Throat: No oropharyngeal exudate.  Eyes: EOM are normal. Pupils are equal, round, and reactive to light. No scleral icterus.  Neck: Normal range of motion. Neck supple.  Cardiovascular: Normal rate, regular rhythm and normal heart sounds.   No murmur heard. Pulmonary/Chest: No stridor. No  respiratory distress. She has no wheezes.  Abdominal: Soft. Bowel sounds are normal.  Musculoskeletal: Normal range of motion. She exhibits no edema.  Neurological: She is alert and oriented to person, place, and time. No cranial nerve deficit.  Skin: Skin is warm. She is not diaphoretic.  Psychiatric: She has a normal mood and affect.          IMAGING    CT chest and CXR 2008 images reviewed No acute findings, no acute abnormalities seen   ASSESSMENT/PLAN   35 yo white female with signs of Viral  URI with Reactive airways disease complicated by bacterial  acute bronchitis with underlying allergic rhinitis  1.continue prednisone taper as prescribed 2.will start Levaquin 500 mg daily for 7 days 3.continue albuterol inh as needed 4.will change to Zyrtec as patient wants to try something different 5.will obtain CXR 2 view to assess for pneumonia 6.check sputum culture 7.recommend follow up in 4-6 weeks with PFT to assess for underlying obstructive lung disease   I have personally obtained a history, examined the patient, evaluated laboratory and independently reviewed imaging results,  formulated the assessment and plan and placed orders.  The Patient requires high complexity decision making for assessment and support, frequent evaluation and titration of therapies, application of advanced monitoring technologies and extensive interpretation of multiple databases.    Patient satisfied with Plan of action and management. All questions answered  Corrin Parker, M.D.  [facility]  Medical Director [place] Director [facility] Cardio-Pulmonary Department

## 2016-06-30 NOTE — Patient Instructions (Signed)
1.start levaquin 500 mg daily for 7 days 2.finish up prednisone taper 3.Check CXR PA/LAT to assess for pneumonia 4.albuterol as needed every 4 hrs 5.zyrtec 10 mg daily  Follow up in 4-6 weeks with PFT's  Acute Bronchitis Bronchitis is inflammation of the airways that extend from the windpipe into the lungs (bronchi). The inflammation often causes mucus to develop. This leads to a cough, which is the most common symptom of bronchitis.  In acute bronchitis, the condition usually develops suddenly and goes away over time, usually in a couple weeks. Smoking, allergies, and asthma can make bronchitis worse. Repeated episodes of bronchitis may cause further lung problems.  CAUSES Acute bronchitis is most often caused by the same virus that causes a cold. The virus can spread from person to person (contagious) through coughing, sneezing, and touching contaminated objects. SIGNS AND SYMPTOMS   Cough.   Fever.   Coughing up mucus.   Body aches.   Chest congestion.   Chills.   Shortness of breath.   Sore throat.  DIAGNOSIS  Acute bronchitis is usually diagnosed through a physical exam. Your health care provider will also ask you questions about your medical history. Tests, such as chest X-rays, are sometimes done to rule out other conditions.  TREATMENT  Acute bronchitis usually goes away in a couple weeks. Oftentimes, no medical treatment is necessary. Medicines are sometimes given for relief of fever or cough. Antibiotic medicines are usually not needed but may be prescribed in certain situations. In some cases, an inhaler may be recommended to help reduce shortness of breath and control the cough. A cool mist vaporizer may also be used to help thin bronchial secretions and make it easier to clear the chest.  HOME CARE INSTRUCTIONS  Get plenty of rest.   Drink enough fluids to keep your urine clear or pale yellow (unless you have a medical condition that requires fluid  restriction). Increasing fluids may help thin your respiratory secretions (sputum) and reduce chest congestion, and it will prevent dehydration.   Take medicines only as directed by your health care provider.  If you were prescribed an antibiotic medicine, finish it all even if you start to feel better.  Avoid smoking and secondhand smoke. Exposure to cigarette smoke or irritating chemicals will make bronchitis worse. If you are a smoker, consider using nicotine gum or skin patches to help control withdrawal symptoms. Quitting smoking will help your lungs heal faster.   Reduce the chances of another bout of acute bronchitis by washing your hands frequently, avoiding people with cold symptoms, and trying not to touch your hands to your mouth, nose, or eyes.   Keep all follow-up visits as directed by your health care provider.  SEEK MEDICAL CARE IF: Your symptoms do not improve after 1 week of treatment.  SEEK IMMEDIATE MEDICAL CARE IF:  You develop an increased fever or chills.   You have chest pain.   You have severe shortness of breath.  You have bloody sputum.   You develop dehydration.  You faint or repeatedly feel like you are going to pass out.  You develop repeated vomiting.  You develop a severe headache. MAKE SURE YOU:   Understand these instructions.  Will watch your condition.  Will get help right away if you are not doing well or get worse.   This information is not intended to replace advice given to you by your health care provider. Make sure you discuss any questions you have with your health care  provider.   Document Released: 12/01/2004 Document Revised: 11/14/2014 Document Reviewed: 04/16/2013 Elsevier Interactive Patient Education [employer].

## 2016-07-03 LAB — CULTURE, RESPIRATORY: CULTURE: NORMAL

## 2016-07-03 LAB — CULTURE, RESPIRATORY W GRAM STAIN

## 2016-07-13 ENCOUNTER — Encounter: Payer: Self-pay | Admitting: Internal Medicine

## 2016-07-13 ENCOUNTER — Encounter: Payer: Self-pay | Admitting: Primary Care

## 2016-07-15 ENCOUNTER — Other Ambulatory Visit: Payer: Self-pay | Admitting: *Deleted

## 2016-07-15 ENCOUNTER — Telehealth: Payer: Self-pay

## 2016-07-15 ENCOUNTER — Encounter: Payer: Self-pay | Admitting: *Deleted

## 2016-07-15 ENCOUNTER — Other Ambulatory Visit: Payer: Self-pay

## 2016-07-15 DIAGNOSIS — R079 Chest pain, unspecified: Principal | ICD-10-CM

## 2016-07-15 DIAGNOSIS — R070 Pain in throat: Secondary | ICD-10-CM

## 2016-07-15 NOTE — Telephone Encounter (Signed)
Gastroenterology Pre-Procedure Review  Request Date: 07/22/2016 Requesting Physician:   PATIENT REVIEW QUESTIONS: The patient responded to the following health history questions as indicated:    1. Are you having any GI issues? no 2. Do you have a personal history of Polyps? no 3. Do you have a family history of Colon Cancer or Polyps? no 4. Diabetes Mellitus? no 5. Joint replacements in the past 12 months?no 6. Major health problems in the past 3 months?no 7. Any artificial heart valves, MVP, or defibrillator?no    MEDICATIONS & ALLERGIES:    Patient reports the following regarding taking any anticoagulation/antiplatelet therapy:   Plavix, Coumadin, Eliquis, Xarelto, Lovenox, Pradaxa, Brilinta, or Effient? no Aspirin? no  Patient confirms/reports the following medications:  Current Outpatient Prescriptions  Medication Sig Dispense Refill  . albuterol (PROVENTIL HFA;VENTOLIN HFA) 108 (90 Base) MCG/ACT inhaler Inhale 2 puffs into the lungs every 6 (six) hours as needed for wheezing or shortness of breath.    . busPIRone (BUSPAR) 15 MG tablet Take 1 tablet (15 mg total) by mouth 2 (two) times daily. 60 tablet 3  . cetirizine (ZYRTEC) 10 MG tablet Take 1 tablet (10 mg total) by mouth daily. 30 tablet 5  . clobetasol cream (TEMOVATE) 0.05 % Apply to affected area twice daily for 2 weeks. 15 g 0  . Doxycycline Hyclate (DORYX MPC) 120 MG TBEC Take 120 mg by mouth daily.    Marland Kitchen gabapentin (NEURONTIN) 100 MG capsule Take 1 to 2 capsules at bedtime for hot flashes. 60 capsule 2  . levofloxacin (LEVAQUIN) 500 MG tablet Take 1 tablet (500 mg total) by mouth daily. X 7 days 7 tablet 0  . Melatonin 10 MG TABS Take 1 tablet by mouth at bedtime.    . mirabegron ER (MYRBETRIQ) 25 MG TB24 tablet Take 25 mg by mouth daily.    . Multiple Vitamins-Minerals (MULTIVITAL) CHEW 2 Chew at bedtime    . venlafaxine XR (EFFEXOR-XR) 150 MG 24 hr capsule Take 150 mg by mouth daily with breakfast.     No current  facility-administered medications for this visit.     Patient confirms/reports the following allergies:  Allergies  Allergen Reactions  . Cefazolin Rash and Shortness Of Breath  . Amoxicillin Other (See Comments)  . Codeine Sulfate Itching  . Doxycycline Other (See Comments)    "stomach upset" per pt records  . Red Dye Hives  . Sulfa Antibiotics Other (See Comments)    "Stomach pain"  . Penicillin V Potassium Rash    No orders of the defined types were placed in this encounter.   AUTHORIZATION INFORMATION Primary Insurance: 1D#: Group #:  Secondary Insurance: 1D#: Group #:  SCHEDULE INFORMATION: Date: 07/22/2016 Time: Location: [facility]

## 2016-07-15 NOTE — Telephone Encounter (Signed)
Dysphagia R13.10 [facility] 07/22/2016 [employer] (does not need prior auth)

## 2016-07-15 NOTE — Progress Notes (Signed)
Order placed for GI ref per DK. Nothing further needed.

## 2016-07-16 ENCOUNTER — Other Ambulatory Visit: Payer: Self-pay | Admitting: Primary Care

## 2016-07-16 DIAGNOSIS — R232 Flushing: Secondary | ICD-10-CM

## 2016-07-18 NOTE — Telephone Encounter (Signed)
Ok to refill? Electronically refill request for   gabapentin (NEURONTIN) 100 MG capsule  Last prescribed on 04/20/2016. Last seen on 03/30/2016.

## 2016-07-21 NOTE — Discharge Instructions (Signed)

## 2016-07-22 ENCOUNTER — Ambulatory Visit: Payer: Managed Care, Other (non HMO) | Admitting: Anesthesiology

## 2016-07-22 ENCOUNTER — Encounter
Admission: RE | Disposition: A | Discharge status: Home / Self Care | Payer: Self-pay | Source: Ambulatory Visit | Attending: Gastroenterology

## 2016-07-22 ENCOUNTER — Ambulatory Visit
Admission: RE | Admit: 2016-07-22 | Discharge: 2016-07-22 | Disposition: A | Discharge status: Home / Self Care | Payer: Managed Care, Other (non HMO) | Source: Ambulatory Visit | Attending: Gastroenterology | Admitting: Gastroenterology

## 2016-07-22 DIAGNOSIS — R1310 Dysphagia, unspecified: Secondary | ICD-10-CM | POA: Diagnosis not present

## 2016-07-22 DIAGNOSIS — K219 Gastro-esophageal reflux disease without esophagitis: Secondary | ICD-10-CM

## 2016-07-22 DIAGNOSIS — Z7952 Long term (current) use of systemic steroids: Secondary | ICD-10-CM | POA: Insufficient documentation

## 2016-07-22 DIAGNOSIS — K2950 Unspecified chronic gastritis without bleeding: Secondary | ICD-10-CM | POA: Insufficient documentation

## 2016-07-22 DIAGNOSIS — K449 Diaphragmatic hernia without obstruction or gangrene: Secondary | ICD-10-CM | POA: Diagnosis not present

## 2016-07-22 DIAGNOSIS — Z79899 Other long term (current) drug therapy: Secondary | ICD-10-CM | POA: Diagnosis not present

## 2016-07-22 DIAGNOSIS — K2970 Gastritis, unspecified, without bleeding: Secondary | ICD-10-CM | POA: Diagnosis not present

## 2016-07-22 DIAGNOSIS — F419 Anxiety disorder, unspecified: Secondary | ICD-10-CM | POA: Insufficient documentation

## 2016-07-22 DIAGNOSIS — Z87891 Personal history of nicotine dependence: Secondary | ICD-10-CM | POA: Diagnosis not present

## 2016-07-22 HISTORY — PX: ESOPHAGOGASTRODUODENOSCOPY (EGD) WITH PROPOFOL: SHX5813

## 2016-07-22 HISTORY — DX: Pneumonia, unspecified organism: J18.9

## 2016-07-22 SURGERY — ESOPHAGOGASTRODUODENOSCOPY (EGD) WITH PROPOFOL
Anesthesia: General

## 2016-07-22 MED ORDER — GLYCOPYRROLATE 0.2 MG/ML IJ SOLN
INTRAMUSCULAR | Status: DC | PRN
  Administered 2016-07-22: 0.2 mg via INTRAVENOUS

## 2016-07-22 MED ORDER — PROPOFOL 10 MG/ML IV BOLUS
INTRAVENOUS | Status: DC | PRN
  Administered 2016-07-22: 50 mg via INTRAVENOUS
  Administered 2016-07-22: 100 mg via INTRAVENOUS
  Administered 2016-07-22: 50 mg via INTRAVENOUS

## 2016-07-22 MED ORDER — LACTATED RINGERS IV SOLN
INTRAVENOUS | Status: DC
  Administered 2016-07-22: 09:00:00 via INTRAVENOUS

## 2016-07-22 MED ORDER — LIDOCAINE HCL (CARDIAC) 20 MG/ML IV SOLN
INTRAVENOUS | Status: DC | PRN
  Administered 2016-07-22: 40 mg via INTRAVENOUS

## 2016-07-22 SURGICAL SUPPLY — 32 items
BALLN DILATOR 10-12 8 (BALLOONS)
BALLN DILATOR 12-15 8 (BALLOONS)
BALLN DILATOR 15-18 8 (BALLOONS)
BALLN DILATOR CRE 0-12 8 (BALLOONS)
BALLN DILATOR ESOPH 8 10 CRE (MISCELLANEOUS) IMPLANT
BALLOON DILATOR 12-15 8 (BALLOONS) IMPLANT
BALLOON DILATOR 15-18 8 (BALLOONS) IMPLANT
BALLOON DILATOR CRE 0-12 8 (BALLOONS) IMPLANT
BLOCK BITE 60FR ADLT L/F GRN (MISCELLANEOUS) ×3 IMPLANT
CANISTER SUCT 1200ML W/VALVE (MISCELLANEOUS) ×3 IMPLANT
CLIP HMST 235XBRD CATH ROT (MISCELLANEOUS) IMPLANT
CLIP RESOLUTION 360 11X235 (MISCELLANEOUS)
FCP ESCP3.2XJMB 240X2.8X (MISCELLANEOUS) ×1
FORCEPS BIOP RAD 4 LRG CAP 4 (CUTTING FORCEPS) IMPLANT
FORCEPS BIOP RJ4 240 W/NDL (MISCELLANEOUS) ×2
FORCEPS ESCP3.2XJMB 240X2.8X (MISCELLANEOUS) ×1 IMPLANT
GOWN CVR UNV OPN BCK APRN NK (MISCELLANEOUS) ×2 IMPLANT
GOWN ISOL THUMB LOOP REG UNIV (MISCELLANEOUS) ×4
INJECTOR VARIJECT VIN23 (MISCELLANEOUS) IMPLANT
KIT DEFENDO VALVE AND CONN (KITS) IMPLANT
KIT ENDO PROCEDURE OLY (KITS) ×3 IMPLANT
MARKER SPOT ENDO TATTOO 5ML (MISCELLANEOUS) IMPLANT
PAD GROUND ADULT SPLIT (MISCELLANEOUS) IMPLANT
RETRIEVER NET PLAT FOOD (MISCELLANEOUS) IMPLANT
SNARE SHORT THROW 13M SML OVAL (MISCELLANEOUS) IMPLANT
SNARE SHORT THROW 30M LRG OVAL (MISCELLANEOUS) IMPLANT
SPOT EX ENDOSCOPIC TATTOO (MISCELLANEOUS)
SYR INFLATION 60ML (SYRINGE) IMPLANT
TRAP ETRAP POLY (MISCELLANEOUS) IMPLANT
VARIJECT INJECTOR VIN23 (MISCELLANEOUS)
WATER STERILE IRR 250ML POUR (IV SOLUTION) ×3 IMPLANT
WIRE CRE 18-20MM 8CM F G (MISCELLANEOUS) IMPLANT

## 2016-07-22 NOTE — Anesthesia Procedure Notes (Signed)
Performed by: Kendrick Haapala Pre-anesthesia Checklist: Patient identified, Emergency Drugs available, Suction available, Timeout performed and Patient being monitored Patient Re-evaluated:Patient Re-evaluated prior to inductionOxygen Delivery Method: Circle system utilized Preoxygenation: Pre-oxygenation with 100% oxygen Intubation Type: Inhalational induction Ventilation: Mask ventilation without difficulty and Mask ventilation throughout procedure Dental Injury: Teeth and Oropharynx as per pre-operative assessment        

## 2016-07-22 NOTE — Op Note (Signed)
[facility] Gastroenterology Patient Name: Martha Page Procedure Date: 07/22/2016 9:21 AM MRN: [id number] Account #: [id number] Date of Birth: 1980-12-09 Admit Type: Outpatient Age: 35 Room: [facility] OR ROOM 01 Gender: Female Note Status: Finalized Procedure:            Upper GI endoscopy Indications:          Dysphagia Providers:            Lucilla Lame MD, MD Referring MD:         Pleas Koch (Referring MD) Medicines:            Propofol per Anesthesia Complications:        No immediate complications. Procedure:            Pre-Anesthesia Assessment:                       - Prior to the procedure, a History and Physical was                        performed, and patient medications and allergies were                        reviewed. The patient's tolerance of previous                        anesthesia was also reviewed. The risks and benefits of                        the procedure and the sedation options and risks were                        discussed with the patient. All questions were                        answered, and informed consent was obtained. Prior                        Anticoagulants: The patient has taken no previous                        anticoagulant or antiplatelet agents. ASA Grade                        Assessment: II - A patient with mild systemic disease.                        After reviewing the risks and benefits, the patient was                        deemed in satisfactory condition to undergo the                        procedure.                       After obtaining informed consent, the endoscope was                        passed under direct vision. Throughout the procedure,  the patient's blood pressure, pulse, and oxygen                        saturations were monitored continuously. The was                        introduced through the mouth, and advanced to the                        second part of  duodenum. The upper GI endoscopy was                        accomplished without difficulty. The patient tolerated                        the procedure well. Findings:      A small hiatal hernia was present.      Two random biopsies were obtained with cold forceps for histology in the       middle third of the esophagus.      Localized mild inflammation characterized by erythema was found in the       gastric antrum. Biopsies were taken with a cold forceps for histology.      The examined duodenum was normal. Impression:           - Small hiatal hernia.                       - Gastritis. Biopsied.                       - Normal examined duodenum.                       - Biopsy performed in the middle third of the esophagus. Recommendation:       - Await pathology results.                       - Discharge patient to home.                       - Resume previous diet.                       - Continue present medications. Procedure Code(s):    --- Professional ---                       [phone], Esophagogastroduodenoscopy, flexible, transoral;                        with biopsy, single or multiple Diagnosis Code(s):    --- Professional ---                       R13.10, Dysphagia, unspecified                       K29.70, Gastritis, unspecified, without bleeding CPT copyright 2016 American Medical Association. All rights reserved. The codes documented in this report are preliminary and upon coder review may  be revised to meet current compliance requirements. Lucilla Lame MD, MD 07/22/2016 9:37:28 AM This report has been signed electronically. Number of Addenda: 0 Note Initiated On: 07/22/2016 9:21 AM  Total Procedure Duration: 0 hours 3 minutes 8 seconds       [facility]

## 2016-07-22 NOTE — Transfer of Care (Signed)
Immediate Anesthesia Transfer of Care Note  Patient: Martha Page  Procedure(s) Performed: Procedure(s): ESOPHAGOGASTRODUODENOSCOPY (EGD) WITH PROPOFOL (N/A)  Patient Location: PACU  Anesthesia Type: General  Level of Consciousness: awake, alert  and patient cooperative  Airway and Oxygen Therapy: Patient Spontanous Breathing and Patient connected to supplemental oxygen  Post-op Assessment: Post-op Vital signs reviewed, Patient's Cardiovascular Status Stable, Respiratory Function Stable, Patent Airway and No signs of Nausea or vomiting  Post-op Vital Signs: Reviewed and stable  Complications: No apparent anesthesia complications

## 2016-07-22 NOTE — H&P (Signed)
Lucilla Lame, MD [facility] [address], [place], [place] [address] Phone: [phone] Fax : [phone]  Primary Care Physician:  Sheral Flow, NP Primary Gastroenterologist:  Dr. Allen Norris  Pre-Procedure History & Physical: HPI:  Martha Page is a 35 y.o. female is here for an endoscopy.   Past Medical History:  Diagnosis Date  . Anxiety   . Elevated liver enzymes   . Frequent headaches   . Migraine    2x/6 mos  . Pneumonia 06/30/2016   has finished antibiotics.  still has lingering nighttime cough  . Shoulder pain     Past Surgical History:  Procedure Laterality Date  . CESAREAN SECTION    . CESAREAN SECTION    . CHOLECYSTECTOMY  2003  . COLONOSCOPY WITH PROPOFOL N/A 04/17/2015   Procedure: COLONOSCOPY WITH PROPOFOL;  Surgeon: Josefine Class, MD;  Location: [facility] ENDOSCOPY;  Service: Endoscopy;  Laterality: N/A;  . DIAGNOSTIC LAPAROSCOPY WITH REMOVAL OF ECTOPIC PREGNANCY    . DILATION AND CURETTAGE OF UTERUS    . DUPUYTREN CONTRACTURE RELEASE    . ECTOPIC PREGNANCY SURGERY    . GALLBLADDER SURGERY  2002  . HEMORRHOID SURGERY    . HEMORRHOID SURGERY N/A 06/05/2015   Procedure: HEMORRHOIDECTOMY;  Surgeon: Leonie Green, MD;  Location: [facility] ORS;  Service: General;  Laterality: N/A;  . INTRAUTERINE DEVICE (IUD) INSERTION    . LASIK    . TUBAL LIGATION      Prior to Admission medications   Medication Sig Start Date End Date Taking? Authorizing Provider  albuterol (PROVENTIL HFA;VENTOLIN HFA) 108 (90 Base) MCG/ACT inhaler Inhale 2 puffs into the lungs every 6 (six) hours as needed for wheezing or shortness of breath.   Yes Historical Provider, MD  busPIRone (BUSPAR) 15 MG tablet Take 1 tablet (15 mg total) by mouth 2 (two) times daily. 04/01/16  Yes Pleas Koch, NP  cetirizine (ZYRTEC) 10 MG tablet Take 1 tablet (10 mg total) by mouth daily. 06/30/16  Yes Flora Lipps, MD  clobetasol cream (TEMOVATE) 0.05 % Apply to affected area twice daily for  2 weeks. 10/22/15  Yes Pleas Koch, NP  Doxycycline Hyclate (DORYX MPC) 120 MG TBEC Take 120 mg by mouth daily. 02/02/16  Yes Historical Provider, MD  gabapentin (NEURONTIN) 100 MG capsule TAKE 1 - 2 CAPSULES BY MOUTH AT BEDTIME FOR HOT FLASHES 07/18/16  Yes Pleas Koch, NP  Melatonin 10 MG TABS Take 1 tablet by mouth at bedtime.   Yes Historical Provider, MD  mirabegron ER (MYRBETRIQ) 25 MG TB24 tablet Take 25 mg by mouth daily.   Yes Historical Provider, MD  Multiple Vitamins-Minerals (MULTIVITAL) CHEW 2 Chew at bedtime   Yes Historical Provider, MD  venlafaxine XR (EFFEXOR-XR) 150 MG 24 hr capsule Take 150 mg by mouth daily with breakfast.   Yes Historical Provider, MD    Allergies as of 07/15/2016 - Review Complete 07/15/2016  Allergen Reaction Noted  . Cefazolin Rash and Shortness Of Breath 04/02/2015  . Codeine sulfate Itching 04/02/2015  . Doxycycline Other (See Comments) 04/02/2015  . Red dye Hives 06/01/2016  . Sulfa antibiotics Other (See Comments) 04/02/2015  . Amoxicillin Rash 04/02/2015  . Penicillin v potassium Rash 04/02/2015    Family History  Problem Relation Age of Onset  . Stroke Mother   . Depression Mother   . Heart disease Father   . Arthritis Paternal Grandmother   . Cancer Paternal Grandmother     lung  . Heart disease  Paternal Grandmother   . Stroke Paternal Grandmother   . Hypertension Paternal Grandmother     Social History   Social History  . Marital status: Single    Spouse name: N/A  . Number of children: N/A  . Years of education: N/A   Occupational History  . Not on file.   Social History Main Topics  . Smoking status: Former Smoker    Quit date: 07/01/2011  . Smokeless tobacco: Never Used  . Alcohol use No  . Drug use: No  . Sexual activity: Yes   Other Topics Concern  . Not on file   Social History Narrative   Married.   6 children.   Works as a Agricultural engineer.          Review of Systems: See HPI, otherwise negative  ROS  Physical Exam: BP 106/77   Pulse 86   Temp 97.3 F (36.3 C) (Temporal)   Resp 16   Ht 5\' 5"  (1.651 m)   Wt 172 lb (78 kg)   SpO2 96%   BMI 28.62 kg/m  General:   Alert,  pleasant and cooperative in NAD Head:  Normocephalic and atraumatic. Neck:  Supple; no masses or thyromegaly. Lungs:  Clear throughout to auscultation.    Heart:  Regular rate and rhythm. Abdomen:  Soft, nontender and nondistended. Normal bowel sounds, without guarding, and without rebound.   Neurologic:  Alert and  oriented x4;  grossly normal neurologically.  Impression/Plan: [name] is here for an endoscopy to be performed for dysphagia  Risks, benefits, limitations, and alternatives regarding  endoscopy have been reviewed with the patient.  Questions have been answered.  All parties agreeable.   Lucilla Lame, MD  07/22/2016, 9:24 AM

## 2016-07-22 NOTE — Anesthesia Preprocedure Evaluation (Addendum)
Anesthesia Evaluation  Patient identified by MRN, date of birth, ID band Patient awake    Reviewed: Allergy & Precautions, H&P , NPO status , Patient's Chart, lab work & pertinent test results, reviewed documented beta blocker date and time   Airway Mallampati: II  TM Distance: >3 FB Neck ROM: full    Dental   Braces:   Pulmonary pneumonia, former smoker,    Pulmonary exam normal breath sounds clear to auscultation       Cardiovascular Exercise Tolerance: Good negative cardio ROS   Rhythm:regular Rate:Normal     Neuro/Psych  Headaches, PSYCHIATRIC DISORDERS negative psych ROS   GI/Hepatic negative GI ROS, Neg liver ROS,   Endo/Other  negative endocrine ROS  Renal/GU   negative genitourinary   Musculoskeletal   Abdominal   Peds  Hematology negative hematology ROS (+)   Anesthesia Other Findings   Reproductive/Obstetrics negative OB ROS                            Anesthesia Physical Anesthesia Plan  ASA: II  Anesthesia Plan: General   Post-op Pain Management:    Induction:   Airway Management Planned:   Additional Equipment:   Intra-op Plan:   Post-operative Plan:   Informed Consent: I have reviewed the patients History and Physical, chart, labs and discussed the procedure including the risks, benefits and alternatives for the proposed anesthesia with the patient or authorized representative who has indicated his/her understanding and acceptance.   Dental Advisory Given  Plan Discussed with: CRNA  Anesthesia Plan Comments:         Anesthesia Quick Evaluation

## 2016-07-22 NOTE — Anesthesia Postprocedure Evaluation (Signed)
Anesthesia Post Note  Patient: Martha Page  Procedure(s) Performed: Procedure(s) (LRB): ESOPHAGOGASTRODUODENOSCOPY (EGD) WITH PROPOFOL (N/A)  Patient location during evaluation: PACU Anesthesia Type: MAC Level of consciousness: awake and alert Pain management: pain level controlled Vital Signs Assessment: post-procedure vital signs reviewed and stable Respiratory status: spontaneous breathing, nonlabored ventilation and respiratory function stable Cardiovascular status: stable and blood pressure returned to baseline Anesthetic complications: no    Burris Matherne D Ayleah Hofmeister

## 2016-07-23 ENCOUNTER — Other Ambulatory Visit: Payer: Self-pay | Admitting: Primary Care

## 2016-07-23 DIAGNOSIS — F411 Generalized anxiety disorder: Secondary | ICD-10-CM

## 2016-07-25 ENCOUNTER — Encounter: Payer: Self-pay | Admitting: Gastroenterology

## 2016-07-25 ENCOUNTER — Encounter: Payer: Self-pay | Admitting: Primary Care

## 2016-07-26 ENCOUNTER — Other Ambulatory Visit: Payer: Self-pay

## 2016-07-26 ENCOUNTER — Encounter: Payer: Self-pay | Admitting: Gastroenterology

## 2016-07-26 MED ORDER — BACLOFEN 10 MG PO TABS: 5.0000 mg | ORAL_TABLET | Freq: Three times a day (TID) | ORAL | 2 refills | Status: DC

## 2016-07-28 ENCOUNTER — Encounter: Payer: Self-pay | Admitting: Gastroenterology

## 2016-08-02 ENCOUNTER — Encounter: Payer: Self-pay | Admitting: Internal Medicine

## 2016-08-02 ENCOUNTER — Telehealth: Payer: Self-pay

## 2016-08-02 NOTE — Telephone Encounter (Signed)
LMOVM to schedule patient for an acute visit per DK. Will await call back

## 2016-08-02 NOTE — Telephone Encounter (Signed)
Patient scheduled for acute visit with VM for 08/03/16 @ 10:00am Pt aware and voiced understanding. Nothing further needed.

## 2016-08-03 ENCOUNTER — Encounter: Payer: Self-pay | Admitting: Internal Medicine

## 2016-08-03 ENCOUNTER — Ambulatory Visit (INDEPENDENT_AMBULATORY_CARE_PROVIDER_SITE_OTHER): Payer: Managed Care, Other (non HMO) | Admitting: Internal Medicine

## 2016-08-03 DIAGNOSIS — J0100 Acute maxillary sinusitis, unspecified: Secondary | ICD-10-CM | POA: Diagnosis not present

## 2016-08-03 MED ORDER — PREDNISONE 20 MG PO TABS: 20.0000 mg | ORAL_TABLET | Freq: Every day | ORAL | 0 refills | Status: DC

## 2016-08-03 MED ORDER — CLARITHROMYCIN 500 MG PO TABS: ORAL_TABLET | ORAL | 0 refills | Status: DC

## 2016-08-03 MED ORDER — AMBULATORY NON FORMULARY MEDICATION: 0 refills | Status: DC

## 2016-08-03 NOTE — Patient Instructions (Addendum)
Follow up with Dr. Mortimer Fries in 3-4 weeks - 2 view CXR prior to follow up visit.  - Prednisone 20 mg, 1 tab daily 5 days with breakfast - Biaxin 500 mg, 1 tab by mouth twice a day 10 days. - Incentive spirometry 10-15 times daily - albuterol inhaler - 2puff every 6 hours for 3 days, then 2puff as needed every 3-4 hours as needed for shortness of breath\wheezing\recurrent cough

## 2016-08-03 NOTE — Assessment & Plan Note (Signed)
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 Symptoms consistent with acute maxillary sinusitis  Plan: Prednisone 20 mg, 1 tab daily 5 days with breakfast Biaxin 500 mg, 1 tab by mouth twice a day 10 days. Incentive spirometry 10-15 times daily

## 2016-08-03 NOTE — Progress Notes (Signed)
[facility] Consultation      MRN# [id number] Martha Page [birth year]   CC: Chief Complaint  Patient presents with  . Acute Visit    KK pt. pt c/o prod cough with green mucus, increased sob, occ wheezing & occ chest tightness X 4d      Events since last clinic visit: Patient presents today for an acute visit. At last visit she was given prednisone taper along with Levaquin. After that she started to have decrease in her symptoms of cough and wheezing. However over the last 5 days she has been having increased cough with productive sputum production, greenish, along with wheezing and chest congestion and sinus congestion. 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    Current Outpatient Prescriptions:  .  albuterol (PROVENTIL HFA;VENTOLIN HFA) 108 (90 Base) MCG/ACT inhaler, Inhale 2 puffs into the lungs every 6 (six) hours as needed for wheezing or shortness of breath., Disp: , Rfl:  .  baclofen (LIORESAL) 10 MG tablet, Take 0.5 tablets (5 mg total) by mouth 3 (three) times daily., Disp: 30 each, Rfl: 2 .  busPIRone (BUSPAR) 15 MG tablet, TAKE 1 TABLET BY MOUTH TWICE A DAY, Disp: 60 tablet, Rfl: 3 .  cetirizine (ZYRTEC) 10 MG tablet, Take 1 tablet (10 mg total) by mouth daily., Disp: 30 tablet, Rfl: 5 .  clobetasol cream (TEMOVATE) 0.05 %, Apply to affected area twice daily for 2 weeks., Disp: 15 g, Rfl: 0 .  Doxycycline Hyclate (DORYX MPC) 120 MG TBEC, Take 120 mg by mouth daily., Disp: , Rfl:  .  gabapentin (NEURONTIN) 100 MG capsule, TAKE 1 - 2 CAPSULES BY MOUTH AT BEDTIME FOR HOT FLASHES, Disp: 90 capsule, Rfl: 1 .  Melatonin 10 MG TABS, Take 1 tablet by mouth at bedtime., Disp: , Rfl:  .  mirabegron ER (MYRBETRIQ) 25 MG TB24 tablet, Take 25 mg by  mouth daily., Disp: , Rfl:  .  Multiple Vitamins-Minerals (MULTIVITAL) CHEW, 2 Chew at bedtime, Disp: , Rfl:  .  venlafaxine XR (EFFEXOR-XR) 150 MG 24 hr capsule, Take 150 mg by mouth daily with breakfast., Disp: , Rfl:    Review of Systems  Constitutional: Negative for chills and fever.  HENT: Positive for congestion.   Eyes: Negative for blurred vision.  Respiratory: Positive for cough, sputum production, shortness of breath and wheezing.   Cardiovascular: Negative for chest pain.  Gastrointestinal: Negative for heartburn and nausea.  Skin: Negative for itching and rash.  Neurological: Negative for dizziness and headaches.  Endo/Heme/Allergies: Does not bruise/bleed easily.  Psychiatric/Behavioral: Negative for depression.      Allergies:  Cefazolin; Codeine sulfate; Doxycycline; Red dye; Sulfa antibiotics; Amoxicillin; and Penicillin v potassium  Physical Examination:  VS: BP 122/72 (BP Location: Left Arm, Cuff Size: Normal)   Pulse 100   Ht 5\' 5"  (1.651 m)   Wt 174 lb (78.9 kg)   SpO2 97%   BMI 28.96 kg/m   General Appearance: No distress  HEENT: PERRLA, no ptosis, no other lesions noticed Pulmonary:normal breath sounds., diaphragmatic excursion normal.No wheezing, No rales   Cardiovascular:  Normal S1,S2.  No m/r/g.     Abdomen:Exam: Benign, Soft, non-tender, No masses  Skin:   warm, no rashes, no ecchymosis  Extremities: normal, no cyanosis, clubbing, warm with normal capillary refill.  Rad results: (The following images and results were reviewed by Dr. Stevenson Clinch on 08/03/2016). CXR 06/30/16 CHEST  2 VIEW  COMPARISON:  Chest x-ray of December 14, 2006  FINDINGS: The lungs are adequately inflated. There is patchy increased density in the lingula anterior laterally. The right lung is clear. There are prominent nipple shadows bilaterally. The heart and pulmonary vascularity are normal. The mediastinum is normal in width. The bony thorax exhibits no acute  abnormality.  IMPRESSION: Atelectasis or early pneumonia in the lingula. Follow-up radiographs are recommended if the patient's symptoms do not resolve with anticipated antibiotic therapy. Otherwise no active cardiopulmonary disease.      Assessment and Plan: 35 year old female with acute maxillary sinusitis Acute maxillary sinusitis Patient recently came a pediatric office. Symptoms consistent with acute maxillary sinusitis  Plan: Prednisone 20 mg, 1 tab daily 5 days with breakfast Biaxin 500 mg, 1 tab by mouth twice a day 10 days. Incentive spirometry 10-15 times daily   Updated Medication List Outpatient Encounter Prescriptions as of 08/03/2016  Medication Sig  . albuterol (PROVENTIL HFA;VENTOLIN HFA) 108 (90 Base) MCG/ACT inhaler Inhale 2 puffs into the lungs every 6 (six) hours as needed for wheezing or shortness of breath.  . baclofen (LIORESAL) 10 MG tablet Take 0.5 tablets (5 mg total) by mouth 3 (three) times daily.  . busPIRone (BUSPAR) 15 MG tablet TAKE 1 TABLET BY MOUTH TWICE A DAY  . cetirizine (ZYRTEC) 10 MG tablet Take 1 tablet (10 mg total) by mouth daily.  Marland Kitchen gabapentin (NEURONTIN) 100 MG capsule TAKE 1 - 2 CAPSULES BY MOUTH AT BEDTIME FOR HOT FLASHES  . Melatonin 10 MG TABS Take 1 tablet by mouth at bedtime.  Marland Kitchen venlafaxine XR (EFFEXOR-XR) 150 MG 24 hr capsule Take 150 mg by mouth daily with breakfast.  . [DISCONTINUED] clobetasol cream (TEMOVATE) 0.05 % Apply to affected area twice daily for 2 weeks.  . [DISCONTINUED] Doxycycline Hyclate (DORYX MPC) 120 MG TBEC Take 120 mg by mouth daily.  . [DISCONTINUED] mirabegron ER (MYRBETRIQ) 25 MG TB24 tablet Take 25 mg by mouth daily.  . [DISCONTINUED] Multiple Vitamins-Minerals (MULTIVITAL) CHEW 2 Chew at bedtime  . AMBULATORY NON FORMULARY MEDICATION Medication Name: incentive spiormeter  . clarithromycin (BIAXIN) 500 MG tablet 1 tablet bid X 10 days  . predniSONE (DELTASONE) 20 MG tablet Take 1 tablet (20 mg total)  by mouth daily.   No facility-administered encounter medications on file as of 08/03/2016.     Orders for this visit: Orders Placed This Encounter  Procedures  . DG Chest 2 View    Standing Status:   Future    Standing Expiration Date:   10/03/2017    Order Specific Question:   Reason for Exam (SYMPTOM  OR DIAGNOSIS REQUIRED)    Answer:   cough,sob    Order Specific Question:   Preferred imaging location?    Answer:   [facility]    Order Specific Question:   Is the patient pregnant?    Answer:   No    Thank  you for the visitation and for allowing  [facility] to assist in the care of your patient. Our recommendations are noted above.  Please contact us if we can be of further service.  Vilinda Boehringer, MD [facility] Office Number: [phone]  Note: This note was prepared with Dragon dictation along with smaller phrase technology. Any transcriptional errors that result from this process are unintentional.

## 2016-08-10 ENCOUNTER — Encounter: Payer: Self-pay | Admitting: Primary Care

## 2016-08-12 ENCOUNTER — Other Ambulatory Visit (INDEPENDENT_AMBULATORY_CARE_PROVIDER_SITE_OTHER): Payer: Managed Care, Other (non HMO)

## 2016-08-12 ENCOUNTER — Other Ambulatory Visit: Payer: Self-pay | Admitting: Primary Care

## 2016-08-12 DIAGNOSIS — R61 Generalized hyperhidrosis: Secondary | ICD-10-CM

## 2016-08-12 DIAGNOSIS — E559 Vitamin D deficiency, unspecified: Secondary | ICD-10-CM

## 2016-08-12 DIAGNOSIS — R232 Flushing: Secondary | ICD-10-CM

## 2016-08-12 LAB — HEMOGLOBIN A1C: Hgb A1c MFr Bld: 5.5 % (ref 4.6–6.5)

## 2016-08-12 LAB — COMPREHENSIVE METABOLIC PANEL
ALT: 36 U/L — AB (ref 0–35)
AST: 18 U/L (ref 0–37)
Albumin: 4 g/dL (ref 3.5–5.2)
Alkaline Phosphatase: 127 U/L — ABNORMAL HIGH (ref 39–117)
BILIRUBIN TOTAL: 0.3 mg/dL (ref 0.2–1.2)
BUN: 13 mg/dL (ref 6–23)
CALCIUM: 9.3 mg/dL (ref 8.4–10.5)
CHLORIDE: 104 meq/L (ref 96–112)
CO2: 27 meq/L (ref 19–32)
Creatinine, Ser: 0.84 mg/dL (ref 0.40–1.20)
GFR: 80.81 mL/min (ref >60.00)
GLUCOSE: 85 mg/dL (ref 70–99)
Potassium: 4.1 mEq/L (ref 3.5–5.1)
Sodium: 137 mEq/L (ref 135–145)
Total Protein: 7.5 g/dL (ref 6.0–8.3)

## 2016-08-12 LAB — VITAMIN D 25 HYDROXY (VIT D DEFICIENCY, FRACTURES): VITD: 16.12 ng/mL — AB (ref 30.00–100.00)

## 2016-08-12 LAB — TSH: TSH: 0.25 u[IU]/mL — AB (ref 0.35–4.50)

## 2016-08-12 MED ORDER — GABAPENTIN 300 MG PO CAPS: ORAL_CAPSULE | ORAL | 1 refills | Status: DC

## 2016-08-15 ENCOUNTER — Other Ambulatory Visit: Payer: Self-pay | Admitting: Primary Care

## 2016-08-15 ENCOUNTER — Encounter: Payer: Self-pay | Admitting: Primary Care

## 2016-08-15 DIAGNOSIS — R7989 Other specified abnormal findings of blood chemistry: Secondary | ICD-10-CM

## 2016-08-18 ENCOUNTER — Ambulatory Visit (INDEPENDENT_AMBULATORY_CARE_PROVIDER_SITE_OTHER): Payer: Managed Care, Other (non HMO) | Admitting: Gastroenterology

## 2016-08-18 ENCOUNTER — Encounter: Payer: Self-pay | Admitting: Gastroenterology

## 2016-08-18 VITALS — BP 129/74 | HR 98 | Temp 97.8°F | Ht 65.0 in | Wt 178.0 lb

## 2016-08-18 DIAGNOSIS — K219 Gastro-esophageal reflux disease without esophagitis: Secondary | ICD-10-CM

## 2016-08-18 DIAGNOSIS — R1310 Dysphagia, unspecified: Secondary | ICD-10-CM

## 2016-08-18 MED ORDER — PANTOPRAZOLE SODIUM 40 MG PO TBEC: 40.0000 mg | DELAYED_RELEASE_TABLET | Freq: Every day | ORAL | 11 refills | Status: DC

## 2016-08-18 NOTE — Progress Notes (Signed)
Primary Care Physician: Sheral Flow, NP  Primary Gastroenterologist:  Dr. Lucilla Lame  Chief Complaint  Patient presents with  . New Patient (Initial Visit)    Pain in chest-throat-Dysphagia    HPI: Martha Page is a 35 y.o. female here For follow-up after having an EGD. The patient reports that she feels like food is hard to go down. She denies the food getting stuck but reports that she has eaten a granola bar for lunch today and it felt like it was just going down her esophagus slowly. The patient underwent an EGD without any narrowing or strictures of the esophagus. The patient did have biopsies of the midesophagus to rule out eosinophilic esophagitis and was found to have mid esophageal biopsies consistent with reflux disease. The patient has not been on any medication after the EGD cause she was waiting to follow-up.  Current Outpatient Prescriptions  Medication Sig Dispense Refill  . albuterol (PROVENTIL HFA;VENTOLIN HFA) 108 (90 Base) MCG/ACT inhaler Inhale 2 puffs into the lungs every 6 (six) hours as needed for wheezing or shortness of breath.    . AMBULATORY NON FORMULARY MEDICATION Medication Name: incentive spiormeter 1 each 0  . busPIRone (BUSPAR) 15 MG tablet TAKE 1 TABLET BY MOUTH TWICE A DAY 60 tablet 3  . cetirizine (ZYRTEC) 10 MG tablet Take 1 tablet (10 mg total) by mouth daily. 30 tablet 5  . gabapentin (NEURONTIN) 300 MG capsule Take 1 capsule by mouth three times daily for hot flashes. 90 capsule 1  . pantoprazole (PROTONIX) 40 MG tablet Take 1 tablet (40 mg total) by mouth daily. 30 tablet 11  . venlafaxine XR (EFFEXOR-XR) 150 MG 24 hr capsule Take 150 mg by mouth daily with breakfast.     No current facility-administered medications for this visit.     Allergies as of 08/18/2016 - Review Complete 08/18/2016  Allergen Reaction Noted  . Cefazolin Rash and Shortness Of Breath 04/02/2015  . Codeine sulfate Itching 04/02/2015  . Doxycycline Other  (See Comments) 04/02/2015  . Red dye Hives 06/01/2016  . Sulfa antibiotics Other (See Comments) 04/02/2015  . Amoxicillin Rash 04/02/2015  . Penicillin v potassium Rash 04/02/2015    ROS:  General: Negative for anorexia, weight loss, fever, chills, fatigue, weakness. ENT: Negative for hoarseness, difficulty swallowing , nasal congestion. CV: Negative for chest pain, angina, palpitations, dyspnea on exertion, peripheral edema.  Respiratory: Negative for dyspnea at rest, dyspnea on exertion, cough, sputum, wheezing.  GI: See history of present illness. GU:  Negative for dysuria, hematuria, urinary incontinence, urinary frequency, nocturnal urination.  Endo: Negative for unusual weight change.    Physical Examination:   BP 129/74   Pulse 98   Temp 97.8 F (36.6 C) (Oral)   Ht 5\' 5"  (1.651 m)   Wt 178 lb (80.7 kg)   BMI 29.62 kg/m   General: Well-nourished, well-developed in no acute distress.  Eyes: No icterus. Conjunctivae pink. Mouth: Oropharyngeal mucosa moist and pink , no lesions erythema or exudate. Lungs: Clear to auscultation bilaterally. Non-labored. Heart: Regular rate and rhythm, no murmurs rubs or gallops.  Abdomen: Bowel sounds are normal, nontender, nondistended, no hepatosplenomegaly or masses, no abdominal bruits or hernia , no rebound or guarding.   Extremities: No lower extremity edema. No clubbing or deformities. Neuro: Alert and oriented x 3.  Grossly intact. Skin: Warm and dry, no jaundice.   Psych: Alert and cooperative, normal mood and affect.  Labs:    Imaging Studies: No results found.  Assessment and Plan:   Martha Page is a 35 y.o. y/o female who comes in today after having signs of reflux on her mid esophageal biopsies. The patient will be started on a PPI. If her symptoms do not improve she has been told to contact me were upon she may need esophageal manometry to see if the esophageal motility is the patient's problem. The patient has been  explained the plan and agrees with it.   Note: This dictation was prepared with Dragon dictation along with smaller phrase technology. Any transcriptional errors that result from this process are unintentional.

## 2016-08-18 NOTE — Patient Instructions (Signed)
You were given a prescription today for an acid reducer. If you have any questions or symptoms do not improve, please contact our office.

## 2016-08-29 ENCOUNTER — Ambulatory Visit: Payer: Managed Care, Other (non HMO) | Admitting: Internal Medicine

## 2016-09-08 ENCOUNTER — Encounter: Payer: Self-pay | Admitting: Primary Care

## 2016-09-09 ENCOUNTER — Other Ambulatory Visit (INDEPENDENT_AMBULATORY_CARE_PROVIDER_SITE_OTHER): Payer: Managed Care, Other (non HMO)

## 2016-09-09 DIAGNOSIS — R946 Abnormal results of thyroid function studies: Secondary | ICD-10-CM

## 2016-09-09 LAB — T3, FREE: T3, Free: 2.6 pg/mL (ref 2.3–4.2)

## 2016-09-09 LAB — T4, FREE: Free T4: 0.8 ng/dL (ref 0.8–1.8)

## 2016-09-12 ENCOUNTER — Other Ambulatory Visit: Payer: Self-pay | Admitting: Primary Care

## 2016-09-12 ENCOUNTER — Telehealth: Payer: Self-pay

## 2016-09-12 DIAGNOSIS — R232 Flushing: Secondary | ICD-10-CM

## 2016-09-12 NOTE — Telephone Encounter (Signed)
[facility] left v/m wanting to clarify dosage and instructions for gabapentin; on 08/10/16 email gabapentin was increased to 300 mg taking 1 cap tid. Appears today refill was requested from [facility] for Gabapentin 100 mg that was OKed by Vallarie Mare CMA.Please advise.

## 2016-09-12 NOTE — Telephone Encounter (Signed)
Please correct to gabapentin to 300 mg TID for hot flashes as this is her current prescription. Please ask patient if she is needing a new refill and cancel old.

## 2016-09-12 NOTE — Telephone Encounter (Signed)
Message left for patient to return my call.  

## 2016-09-13 MED ORDER — GABAPENTIN 300 MG PO CAPS: 300.0000 mg | ORAL_CAPSULE | Freq: Three times a day (TID) | ORAL | 0 refills | Status: DC

## 2016-09-13 NOTE — Telephone Encounter (Signed)
I'm happy to take a look, but I need records first. Please either have her request records or have her sign a release form so we can obtain her Dermatology records.  Has she tried any other treatment for acne before? If so, then what? Has she tried benzoyl peroxide body washes? This may be a good option for her and they are purchased over the counter.

## 2016-09-13 NOTE — Telephone Encounter (Signed)
Patient respond thru Mychart and requesting as follows:  Could you see if Martha Page could pick up my Doryx (spell) rx that the dermatologist gave me. Since not taking it I have had increased acne on my back and neck. I try to keep my face washed with face wash that has peroxide in it. But that only takes care of my face.   Please advise.

## 2016-09-22 ENCOUNTER — Encounter: Payer: Self-pay | Admitting: Primary Care

## 2016-10-09 ENCOUNTER — Other Ambulatory Visit: Payer: Self-pay | Admitting: Primary Care

## 2016-10-09 DIAGNOSIS — R232 Flushing: Secondary | ICD-10-CM

## 2016-10-09 DIAGNOSIS — R61 Generalized hyperhidrosis: Secondary | ICD-10-CM

## 2016-11-01 ENCOUNTER — Other Ambulatory Visit: Payer: Self-pay | Admitting: Primary Care

## 2016-11-01 DIAGNOSIS — R232 Flushing: Secondary | ICD-10-CM

## 2016-11-01 DIAGNOSIS — R61 Generalized hyperhidrosis: Secondary | ICD-10-CM

## 2016-11-09 ENCOUNTER — Encounter: Payer: Self-pay | Admitting: Primary Care

## 2016-11-09 ENCOUNTER — Other Ambulatory Visit: Payer: Self-pay | Admitting: Primary Care

## 2016-11-09 DIAGNOSIS — R232 Flushing: Secondary | ICD-10-CM

## 2016-11-09 DIAGNOSIS — F329 Major depressive disorder, single episode, unspecified: Secondary | ICD-10-CM

## 2016-11-09 DIAGNOSIS — F419 Anxiety disorder, unspecified: Principal | ICD-10-CM

## 2016-11-09 DIAGNOSIS — F32A Depression, unspecified: Secondary | ICD-10-CM

## 2016-11-09 MED ORDER — VENLAFAXINE HCL ER 150 MG PO CP24: 150.0000 mg | ORAL_CAPSULE | Freq: Every day | ORAL | 1 refills | Status: DC

## 2016-11-09 MED ORDER — GABAPENTIN 300 MG PO CAPS: 300.0000 mg | ORAL_CAPSULE | Freq: Three times a day (TID) | ORAL | 2 refills | Status: DC

## 2016-11-14 ENCOUNTER — Encounter: Payer: Self-pay | Admitting: Primary Care

## 2016-11-19 ENCOUNTER — Other Ambulatory Visit: Payer: Self-pay | Admitting: Primary Care

## 2016-11-19 DIAGNOSIS — F411 Generalized anxiety disorder: Secondary | ICD-10-CM

## 2016-11-21 NOTE — Telephone Encounter (Signed)
Ok to refill? Electronically refill request for   busPIRone (BUSPAR) 15 MG tablet  Last prescribed on 07/25/2016. Last seen on 08/18/2016.

## 2016-12-18 ENCOUNTER — Encounter: Payer: Self-pay | Admitting: Internal Medicine

## 2016-12-19 ENCOUNTER — Ambulatory Visit (INDEPENDENT_AMBULATORY_CARE_PROVIDER_SITE_OTHER): Payer: Managed Care, Other (non HMO) | Admitting: Family Medicine

## 2016-12-19 ENCOUNTER — Encounter: Payer: Self-pay | Admitting: Family Medicine

## 2016-12-19 VITALS — BP 96/70 | HR 102 | Temp 98.1°F | Ht 65.0 in | Wt 189.1 lb

## 2016-12-19 DIAGNOSIS — R042 Hemoptysis: Secondary | ICD-10-CM | POA: Diagnosis not present

## 2016-12-19 DIAGNOSIS — R059 Cough, unspecified: Secondary | ICD-10-CM

## 2016-12-19 DIAGNOSIS — J069 Acute upper respiratory infection, unspecified: Secondary | ICD-10-CM | POA: Diagnosis not present

## 2016-12-19 DIAGNOSIS — R05 Cough: Secondary | ICD-10-CM | POA: Diagnosis not present

## 2016-12-19 MED ORDER — PREDNISONE 20 MG PO TABS: 40.0000 mg | ORAL_TABLET | Freq: Every day | ORAL | 0 refills | Status: DC

## 2016-12-19 MED ORDER — BENZONATATE 100 MG PO CAPS: 100.0000 mg | ORAL_CAPSULE | Freq: Two times a day (BID) | ORAL | 0 refills | Status: DC | PRN

## 2016-12-19 NOTE — Patient Instructions (Signed)
BEFORE YOU LEAVE: -xray sheet  Get the xray.  Albuterol and tessalon as needed.  Prednisone if needed.  Follow up if worsening, new concerns or symptoms persistent longer then expected.   INSTRUCTIONS FOR UPPER RESPIRATORY INFECTION:  -plenty of rest and fluids  -nasal saline wash 2-3 times daily (use prepackaged nasal saline or bottled/distilled water if making your own)   -can use AFRIN nasal spray for drainage and nasal congestion - but do NOT use longer then 3-4 days  -can use tylenol (in no history of liver disease) or ibuprofen (if no history of kidney disease, bowel bleeding or significant heart disease) as directed for aches and sorethroat  -in the winter time, using a humidifier at night is helpful (please follow cleaning instructions)  -if you are taking a cough medication - use only as directed, may also try a teaspoon of honey to coat the throat and throat lozenges.  -for sore throat, salt water gargles can help  -follow up if you have fevers, facial pain, tooth pain, difficulty breathing or are worsening or symptoms persist longer then expected  Upper Respiratory Infection, Adult An upper respiratory infection (URI) is also known as the common cold. It is often caused by a type of germ (virus). Colds are easily spread (contagious). You can pass it to others by kissing, coughing, sneezing, or drinking out of the same glass. Usually, you get better in 1 to 3  weeks.  However, the cough can last for even longer. HOME CARE   Only take medicine as told by your doctor. Follow instructions provided above.  Drink enough water and fluids to keep your pee (urine) clear or pale yellow.  Get plenty of rest.  Return to work when your temperature is < 100 for 24 hours or as told by your doctor. You may use a face mask and wash your hands to stop your cold from spreading. GET HELP RIGHT AWAY IF:   After the first few days, you feel you are getting worse.  You have questions  about your medicine.  You have chills, shortness of breath, or red spit (mucus).  You have pain in the face for more then 1-2 days, especially when you bend forward.  You have a fever, puffy (swollen) neck, pain when you swallow, or white spots in the back of your throat.  You have a bad headache, ear pain, sinus pain, or chest pain.  You have a high-pitched whistling sound when you breathe in and out (wheezing).  You cough up blood.  You have sore muscles or a stiff neck. MAKE SURE YOU:   Understand these instructions.  Will watch your condition.  Will get help right away if you are not doing well or get worse. Document Released: 04/11/2008 Document Revised: 01/16/2012 Document Reviewed: 01/29/2014 [facility] Patient Information 2015 [place], [place]. This information is not intended to replace advice given to you by your health care provider. Make sure you discuss any questions you have with your health care provider.

## 2016-12-19 NOTE — Progress Notes (Signed)
Pre visit review using our clinic review tool, if applicable. No additional management support is needed unless otherwise documented below in the visit note. 

## 2016-12-19 NOTE — Telephone Encounter (Signed)
Pt called asking if we can please call her as soon as we can she is worried she may have Bronchitis  She asked if we can call her back at work  [phone] (see my chart message she sent one yesterday)

## 2016-12-19 NOTE — Progress Notes (Signed)
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  Past Medical History:  Diagnosis Date  . Anxiety   . Elevated liver enzymes   . Frequent headaches   . Migraine    2x/6 mos  . Pneumonia 06/30/2016   has finished antibiotics.  still has lingering nighttime cough  . Shoulder pain     Past Surgical History:  Procedure Laterality Date  . CESAREAN SECTION    . CESAREAN SECTION    . CHOLECYSTECTOMY  2003  . COLONOSCOPY WITH PROPOFOL N/A 04/17/2015   Procedure: COLONOSCOPY WITH PROPOFOL;  Surgeon: Josefine Class, MD;  Location: [facility] ENDOSCOPY;  Service: Endoscopy;  Laterality: N/A;  . DIAGNOSTIC LAPAROSCOPY WITH REMOVAL OF ECTOPIC PREGNANCY    . DILATION AND CURETTAGE OF UTERUS    . DUPUYTREN CONTRACTURE RELEASE    . ECTOPIC PREGNANCY SURGERY    . ESOPHAGOGASTRODUODENOSCOPY (EGD) WITH PROPOFOL N/A 07/22/2016   Procedure: ESOPHAGOGASTRODUODENOSCOPY (EGD) WITH PROPOFOL;  Surgeon: Lucilla Lame, MD;  Location: [place];  Service: Endoscopy;  Laterality: N/A;  . GALLBLADDER SURGERY  2002  . HEMORRHOID SURGERY    . HEMORRHOID SURGERY N/A 06/05/2015   Procedure: HEMORRHOIDECTOMY;  Surgeon: Leonie Green, MD;  Location: [facility] ORS;  Service: General;  Laterality: N/A;  . INTRAUTERINE DEVICE (IUD) INSERTION    . LASIK    . TUBAL LIGATION      Family History  Problem Relation Age of Onset  . Stroke Mother   . Depression Mother   . Heart disease Father   . Arthritis Paternal Grandmother   . Cancer  Paternal Grandmother     lung  . Heart disease Paternal Grandmother   . Stroke Paternal Grandmother   . Hypertension Paternal Grandmother     Social History   Social History  . Marital status: Single    Spouse name: N/A  . Number of children: N/A  . Years of education: N/A   Social History Main Topics  . Smoking status: Former Smoker    Quit date: 07/01/2011  . Smokeless tobacco: Never Used  . Alcohol use No  . Drug use: No  . Sexual activity: Yes   Other Topics Concern  . None   Social History Narrative   Married.   6 children.   Works as a Agricultural engineer.           Current Outpatient Prescriptions:  .  albuterol (PROVENTIL HFA;VENTOLIN HFA) 108 (90 Base) MCG/ACT inhaler, Inhale 2 puffs into the lungs every 6 (six) hours as needed for wheezing or shortness of breath., Disp: , Rfl:  .  AMBULATORY NON FORMULARY MEDICATION, Medication Name: incentive spiormeter, Disp: 1 each, Rfl: 0 .  busPIRone (BUSPAR) 15 MG tablet, TAKE 1 TABLET BY MOUTH TWICE A DAY, Disp: 180 tablet, Rfl: 1 .  cetirizine (ZYRTEC) 10 MG tablet, Take 1 tablet (10 mg total) by mouth daily., Disp: 30 tablet, Rfl: 5 .  gabapentin (NEURONTIN) 300 MG capsule, Take 1 capsule (300 mg total) by mouth 3 (three) times daily., Disp: 270 capsule, Rfl: 2 .  MYRBETRIQ 25 MG TB24 tablet, Take 25 mg by mouth daily., Disp: , Rfl: 6 .  pantoprazole (PROTONIX) 40 MG tablet, Take 1 tablet (40 mg total) by mouth daily., Disp: 30 tablet, Rfl: 11 .  venlafaxine XR (EFFEXOR-XR) 150 MG 24 hr capsule, Take 1 capsule (150 mg total) by mouth daily with breakfast., Disp: 90 capsule, Rfl: 1 .  benzonatate (TESSALON) 100 MG capsule, Take 1 capsule (100 mg total) by mouth 2 (two) times daily as needed for cough., Disp: 20 capsule, Rfl: 0 .  predniSONE (DELTASONE) 20 MG tablet, Take 2 tablets (40 mg total) by mouth daily with breakfast., Disp: 8 tablet, Rfl: 0  EXAM:  Vitals:   12/19/16 1647  BP: 96/70  Pulse: (!) 102  Temp: 98.1 F  (36.7 C)    Body mass index is 31.47 kg/m.  GENERAL: vitals reviewed and listed above, alert, oriented, appears well hydrated and in no acute distress  HEENT: atraumatic, conjunttiva clear, no obvious abnormalities on inspection of external nose and ears, normal appearance of ear canals and TMs, clear nasal congestion, mild post oropharyngeal erythema with PND, no tonsillar edema or exudate, no sinus TTP  NECK: no obvious masses on inspection  LUNGS: clear to auscultation bilaterally, no wheezes, rales or rhonchi, good air movement  CV: HRRR, no peripheral edema  MS: moves all extremities without noticeable abnormality  PSYCH: pleasant and cooperative, no obvious depression or anxiety  ASSESSMENT AND PLAN:  Discussed the following assessment and plan:  Upper respiratory tract infection, unspecified type - Plan: DG Chest 2 View, CANCELED: DG Chest 2 View  Hemoptysis - Plan: DG Chest 2 View, CANCELED: DG Chest 2 View  Cough - Plan: DG Chest 2 View, CANCELED: DG Chest 2 View  -given HPI and exam findings today, a serious infection or illness is unlikely. We discussed potential etiologies, with VURI with mild asthma exacerbationbeing most likely. CXR to exclude other. Tessalon and alb as needed and prednisone if needed. We discussed treatment side effects, likely course, antibiotic misuse, transmission, and signs of developing a serious illness. -of course, we advised to return or notify a doctor immediately if symptoms worsen or persist or new concerns arise.    Patient Instructions  BEFORE YOU LEAVE: -xray sheet  Get the xray.  Albuterol and tessalon as needed.  Prednisone if needed.  Follow up if worsening, new concerns or symptoms persistent longer then expected.   INSTRUCTIONS FOR UPPER RESPIRATORY INFECTION:  -plenty of rest and fluids  -nasal saline wash 2-3 times daily (use prepackaged nasal saline or bottled/distilled water if making your own)   -can use  AFRIN nasal spray for drainage and nasal congestion - but do NOT use longer then 3-4 days  -can use tylenol (in no history of liver disease) or ibuprofen (if no history of kidney disease, bowel bleeding or significant heart disease) as directed for aches and sorethroat  -in the winter time, using a humidifier at night is helpful (please follow cleaning instructions)  -if you are taking a cough medication - use only as directed, may also try a teaspoon of honey to coat the throat and throat lozenges.  -for sore throat, salt water gargles can help  -follow up if you have fevers, facial pain, tooth pain, difficulty breathing or are worsening or symptoms persist longer then expected  Upper Respiratory Infection, Adult An upper respiratory infection (URI) is also known as the common cold. It is often caused by a type of germ (virus). Colds are  easily spread (contagious). You can pass it to others by kissing, coughing, sneezing, or drinking out of the same glass. Usually, you get better in 1 to 3  weeks.  However, the cough can last for even longer. HOME CARE   Only take medicine as told by your doctor. Follow instructions provided above.  Drink enough water and fluids to keep your pee (urine) clear or pale yellow.  Get plenty of rest.  Return to work when your temperature is < 100 for 24 hours or as told by your doctor. You may use a face mask and wash your hands to stop your cold from spreading. GET HELP RIGHT AWAY IF:   After the first few days, you feel you are getting worse.  You have questions about your medicine.  You have chills, shortness of breath, or red spit (mucus).  You have pain in the face for more then 1-2 days, especially when you bend forward.  You have a fever, puffy (swollen) neck, pain when you swallow, or white spots in the back of your throat.  You have a bad headache, ear pain, sinus pain, or chest pain.  You have a high-pitched whistling sound when you breathe  in and out (wheezing).  You cough up blood.  You have sore muscles or a stiff neck. MAKE SURE YOU:   Understand these instructions.  Will watch your condition.  Will get help right away if you are not doing well or get worse. Document Released: 04/11/2008 Document Revised: 01/16/2012 Document Reviewed: 01/29/2014 [facility] Patient Information 2015 [place], [place]. This information is not intended to replace advice given to you by your health care provider. Make sure you discuss any questions you have with your health care provider.      Colin Benton R., DO

## 2016-12-20 ENCOUNTER — Ambulatory Visit
Admission: RE | Admit: 2016-12-20 | Discharge: 2016-12-20 | Disposition: A | Discharge status: Home / Self Care | Payer: Managed Care, Other (non HMO) | Source: Ambulatory Visit | Attending: Family Medicine | Admitting: Family Medicine

## 2016-12-20 ENCOUNTER — Telehealth: Payer: Self-pay | Admitting: *Deleted

## 2016-12-20 DIAGNOSIS — J069 Acute upper respiratory infection, unspecified: Secondary | ICD-10-CM | POA: Insufficient documentation

## 2016-12-20 DIAGNOSIS — R059 Cough, unspecified: Secondary | ICD-10-CM

## 2016-12-20 DIAGNOSIS — R042 Hemoptysis: Secondary | ICD-10-CM

## 2016-12-20 DIAGNOSIS — R05 Cough: Secondary | ICD-10-CM

## 2016-12-20 NOTE — Telephone Encounter (Signed)
Per Dr Maudie Mercury I called the pt and left a detailed message stating the x-ray was normal and to call back if she has any questions.

## 2016-12-26 ENCOUNTER — Ambulatory Visit: Payer: Managed Care, Other (non HMO) | Admitting: Pulmonary Disease

## 2017-01-02 ENCOUNTER — Encounter: Payer: Self-pay | Admitting: Primary Care

## 2017-01-02 ENCOUNTER — Other Ambulatory Visit: Payer: Self-pay | Admitting: Primary Care

## 2017-01-02 DIAGNOSIS — R232 Flushing: Secondary | ICD-10-CM

## 2017-01-02 DIAGNOSIS — F411 Generalized anxiety disorder: Secondary | ICD-10-CM

## 2017-01-02 MED ORDER — VENLAFAXINE HCL ER 37.5 MG PO CP24: ORAL_CAPSULE | ORAL | 0 refills | Status: DC

## 2017-01-04 ENCOUNTER — Other Ambulatory Visit: Payer: Self-pay | Admitting: *Deleted

## 2017-01-04 MED ORDER — CETIRIZINE HCL 10 MG PO TABS: 10.0000 mg | ORAL_TABLET | Freq: Every day | ORAL | 5 refills | Status: DC

## 2017-01-17 ENCOUNTER — Other Ambulatory Visit: Payer: Self-pay | Admitting: Primary Care

## 2017-01-17 DIAGNOSIS — R232 Flushing: Secondary | ICD-10-CM

## 2017-01-17 DIAGNOSIS — F411 Generalized anxiety disorder: Secondary | ICD-10-CM

## 2017-01-17 NOTE — Telephone Encounter (Signed)
She shouldn't need a refill as she was weaning off. Has she weaned off? How's she doing?

## 2017-01-17 NOTE — Telephone Encounter (Signed)
Ok to refill? Electronically refill request for venlafaxine XR (EFFEXOR XR) 37.5 MG 24 hr capsule. Last prescribed on 01/02/2017. Last seen on 04/01/2016

## 2017-01-20 NOTE — Telephone Encounter (Signed)
Spoken to patient and she stated that she does not need the refill. She was feeling like she needed at that when she requested. Now she is better and asked [place] to discard the request.  Notified patient that the refill was not sent.

## 2017-01-23 ENCOUNTER — Encounter: Payer: Self-pay | Admitting: Primary Care

## 2017-01-27 ENCOUNTER — Ambulatory Visit (INDEPENDENT_AMBULATORY_CARE_PROVIDER_SITE_OTHER): Payer: Managed Care, Other (non HMO) | Admitting: Primary Care

## 2017-01-27 ENCOUNTER — Encounter: Payer: Self-pay | Admitting: Primary Care

## 2017-01-27 VITALS — BP 122/86 | HR 102 | Temp 97.6°F | Ht 65.0 in | Wt 190.0 lb

## 2017-01-27 DIAGNOSIS — F411 Generalized anxiety disorder: Secondary | ICD-10-CM

## 2017-01-27 DIAGNOSIS — E559 Vitamin D deficiency, unspecified: Secondary | ICD-10-CM

## 2017-01-27 DIAGNOSIS — R945 Abnormal results of liver function studies: Secondary | ICD-10-CM

## 2017-01-27 DIAGNOSIS — F325 Major depressive disorder, single episode, in full remission: Secondary | ICD-10-CM

## 2017-01-27 DIAGNOSIS — R946 Abnormal results of thyroid function studies: Secondary | ICD-10-CM

## 2017-01-27 DIAGNOSIS — K219 Gastro-esophageal reflux disease without esophagitis: Secondary | ICD-10-CM

## 2017-01-27 DIAGNOSIS — R232 Flushing: Secondary | ICD-10-CM | POA: Diagnosis not present

## 2017-01-27 DIAGNOSIS — R7989 Other specified abnormal findings of blood chemistry: Secondary | ICD-10-CM

## 2017-01-27 LAB — VITAMIN D 25 HYDROXY (VIT D DEFICIENCY, FRACTURES): VITD: 19.16 ng/mL — AB (ref 30.00–100.00)

## 2017-01-27 LAB — HEPATIC FUNCTION PANEL
ALBUMIN: 4.2 g/dL (ref 3.5–5.2)
ALK PHOS: 118 U/L — AB (ref 39–117)
ALT: 25 U/L (ref 0–35)
AST: 17 U/L (ref 0–37)
BILIRUBIN DIRECT: 0.1 mg/dL (ref 0.0–0.3)
TOTAL PROTEIN: 7.7 g/dL (ref 6.0–8.3)
Total Bilirubin: 0.5 mg/dL (ref 0.2–1.2)

## 2017-01-27 LAB — TSH: TSH: 1.5 u[IU]/mL (ref 0.35–4.50)

## 2017-01-27 MED ORDER — GABAPENTIN 300 MG PO CAPS: 300.0000 mg | ORAL_CAPSULE | Freq: Two times a day (BID) | ORAL | 2 refills | Status: DC

## 2017-01-27 NOTE — Assessment & Plan Note (Signed)
Ongoing problem for the past 1-2 years. Feels well managed on gabapentin BID, will continue this. Weaned off Effexor. Will have her add in black [place] for breakthrough flashes. Discussed to have her children sleep in their own beds to reduce body heat at night. Continue to monitor.

## 2017-01-27 NOTE — Progress Notes (Signed)
Subjective:    Patient ID: Martha Page, female    DOB: 1981-03-09, 36 y.o.   MRN: [id number]  HPI  Ms. Martha Page is a 36 year old female who presents today for follow up.  1) Hot Flashes: Currently managed on gabapentin 300 mg three times daily and Effexor XR 150 mg. She sent an e-mail several weeks ago requesting to wean off of Effexor as she didn't feel like she required use of this medication. During the weaning process she noticed quick return of hot flashes.   She's felt better over the last two days and thinks her body has adjusted from weaning off of Effexor. She's taking gabapentin twice daily most of the time. She will experience breakthrough night sweats mostly. She sleeps with her three children and husband. Overall she's feeling well managed on gabapentin alone.  2) Anxiety and Depression: Previously managed on Effexor XR 150 for which she was recently weaned off. Currently managed on Buspar 15 mg BID. She stopped taking the Buspar about four months ago as she didn't notice any improvement in anxiety.   Overall she's feeling well managed with anxiety and depression off of the medications. She's feels as though she's recovered from the loss of her step-father in Summer 2017.  3) GERD: Currently managed on pantoprazole 40 mg that was initiated last year per GI. She's not had this medication in four days and is without symptoms. Her symptoms of acid reflux include belching, throat fullness. She's not tried a reduced dose of pantoprazole and has not tried H2 Blockers.   Review of Systems  Respiratory: Negative for shortness of breath.   Cardiovascular: Negative for chest pain.  Gastrointestinal:       Denies GERD symptoms  Genitourinary:       Hot Flashes  Psychiatric/Behavioral: The patient is not nervous/anxious.        Past Medical History:  Diagnosis Date  . Anxiety   . Elevated liver enzymes   . Frequent headaches   . Migraine    2x/6 mos  . Pneumonia 06/30/2016     has finished antibiotics.  still has lingering nighttime cough  . Shoulder pain      Social History   Social History  . Marital status: Single    Spouse name: N/A  . Number of children: N/A  . Years of education: N/A   Occupational History  . Not on file.   Social History Main Topics  . Smoking status: Former Smoker    Quit date: 07/01/2011  . Smokeless tobacco: Never Used  . Alcohol use No  . Drug use: No  . Sexual activity: Yes   Other Topics Concern  . Not on file   Social History Narrative   Married.   6 children.   Works as a Agricultural engineer.          Past Surgical History:  Procedure Laterality Date  . CESAREAN SECTION    . CESAREAN SECTION    . CHOLECYSTECTOMY  2003  . COLONOSCOPY WITH PROPOFOL N/A 04/17/2015   Procedure: COLONOSCOPY WITH PROPOFOL;  Surgeon: Josefine Class, MD;  Location: [facility] ENDOSCOPY;  Service: Endoscopy;  Laterality: N/A;  . DIAGNOSTIC LAPAROSCOPY WITH REMOVAL OF ECTOPIC PREGNANCY    . DILATION AND CURETTAGE OF UTERUS    . DUPUYTREN CONTRACTURE RELEASE    . ECTOPIC PREGNANCY SURGERY    . ESOPHAGOGASTRODUODENOSCOPY (EGD) WITH PROPOFOL N/A 07/22/2016   Procedure: ESOPHAGOGASTRODUODENOSCOPY (EGD) WITH PROPOFOL;  Surgeon: Lucilla Lame, MD;  Location:  [place];  Service: Endoscopy;  Laterality: N/A;  . GALLBLADDER SURGERY  2002  . HEMORRHOID SURGERY    . HEMORRHOID SURGERY N/A 06/05/2015   Procedure: HEMORRHOIDECTOMY;  Surgeon: Leonie Green, MD;  Location: [facility] ORS;  Service: General;  Laterality: N/A;  . INTRAUTERINE DEVICE (IUD) INSERTION    . LASIK    . TUBAL LIGATION      Family History  Problem Relation Age of Onset  . Stroke Mother   . Depression Mother   . Heart disease Father   . Arthritis Paternal Grandmother   . Cancer Paternal Grandmother     lung  . Heart disease Paternal Grandmother   . Stroke Paternal Grandmother   . Hypertension Paternal Grandmother     Allergies  Allergen Reactions  .  Cefazolin Rash and Shortness Of Breath  . Codeine Sulfate Itching  . Doxycycline Other (See Comments)    "stomach upset" per pt records  . Red Dye Hives  . Sulfa Antibiotics Other (See Comments)    "Stomach pain"  . Amoxicillin Rash  . Penicillin V Potassium Rash    Current Outpatient Prescriptions on File Prior to Visit  Medication Sig Dispense Refill  . albuterol (PROVENTIL HFA;VENTOLIN HFA) 108 (90 Base) MCG/ACT inhaler Inhale 2 puffs into the lungs every 6 (six) hours as needed for wheezing or shortness of breath.    . cetirizine (ZYRTEC) 10 MG tablet Take 1 tablet (10 mg total) by mouth daily. 30 tablet 5  . MYRBETRIQ 25 MG TB24 tablet Take 25 mg by mouth daily.  6  . pantoprazole (PROTONIX) 40 MG tablet Take 1 tablet (40 mg total) by mouth daily. 30 tablet 11   No current facility-administered medications on file prior to visit.     BP 122/86   Pulse (!) 102   Temp 97.6 F (36.4 C)   Ht 5\' 5"  (1.651 m)   Wt 190 lb (86.2 kg)   SpO2 98%   BMI 31.62 kg/m    Objective:   Physical Exam  Constitutional: She appears well-nourished.  Neck: Neck supple.  Cardiovascular: Normal rate and regular rhythm.   Pulmonary/Chest: Effort normal and breath sounds normal.  Skin: Skin is warm and dry.  Psychiatric: She has a normal mood and affect.          Assessment & Plan:

## 2017-01-27 NOTE — Assessment & Plan Note (Signed)
Placed on pantoprazole 40 mg per GI. Discussed not to use if not needed. Will have her try Zantac 150 mg PRN. She will update if no improvement.

## 2017-01-27 NOTE — Assessment & Plan Note (Signed)
Overall improved off of all medications. Continue to monitor.

## 2017-01-27 NOTE — Patient Instructions (Signed)
Continue gabapentin 300 mg twice daily.  You may add in [facility] [place] as needed for night sweats.   If you experience acid reflux then start with ranitidine 150 mg once to twice daily. This can be purchased over the counter. Please message me if no improvement.   It was a pleasure to see you today!

## 2017-01-27 NOTE — Assessment & Plan Note (Signed)
Overall stable off of medications. Continue to monitor.

## 2017-01-27 NOTE — Assessment & Plan Note (Signed)
Check vitamin D today. 

## 2017-03-23 ENCOUNTER — Encounter: Payer: Self-pay | Admitting: Primary Care

## 2017-03-24 MED ORDER — VENLAFAXINE HCL ER 37.5 MG PO CP24: 37.5000 mg | ORAL_CAPSULE | Freq: Every day | ORAL | 2 refills | Status: DC

## 2017-03-24 NOTE — Addendum Note (Signed)
Addended by: Pilar Grammes on: 03/24/2017 02:03 PM   Modules accepted: Orders

## 2017-05-08 ENCOUNTER — Encounter: Payer: Self-pay | Admitting: Primary Care

## 2017-05-08 ENCOUNTER — Other Ambulatory Visit: Payer: Self-pay | Admitting: Internal Medicine

## 2017-05-08 MED ORDER — VENLAFAXINE HCL ER 75 MG PO CP24: 75.0000 mg | ORAL_CAPSULE | Freq: Every day | ORAL | 2 refills | Status: DC

## 2017-07-11 ENCOUNTER — Other Ambulatory Visit: Payer: Self-pay | Admitting: Internal Medicine

## 2017-07-11 NOTE — Telephone Encounter (Signed)
Paper request for Cetirizine received and denied. Patient has not been seen since Dr. Stevenson Clinch.

## 2017-08-07 ENCOUNTER — Other Ambulatory Visit: Payer: Self-pay | Admitting: Internal Medicine

## 2017-08-08 NOTE — Telephone Encounter (Signed)
Last filled 05/08/17 by [facility] in your absence.... Please advise

## 2017-08-09 ENCOUNTER — Encounter: Payer: Self-pay | Admitting: Primary Care

## 2017-08-09 DIAGNOSIS — J302 Other seasonal allergic rhinitis: Secondary | ICD-10-CM

## 2017-08-09 DIAGNOSIS — F411 Generalized anxiety disorder: Secondary | ICD-10-CM

## 2017-08-09 MED ORDER — CETIRIZINE HCL 10 MG PO TABS: 10.0000 mg | ORAL_TABLET | Freq: Every day | ORAL | 0 refills | Status: DC

## 2017-08-09 MED ORDER — VENLAFAXINE HCL ER 75 MG PO CP24: 75.0000 mg | ORAL_CAPSULE | Freq: Every day | ORAL | 0 refills | Status: DC

## 2017-10-12 ENCOUNTER — Telehealth: Payer: Self-pay

## 2017-10-12 NOTE — Telephone Encounter (Signed)
Copied from [place] [id number]. Topic: Quick Communication - See Telephone Encounter >> Oct 12, 2017 10:58 AM Bea Graff, NT wrote: CRM for notification. See Telephone encounter for: [facility] in [place] said that he accidentally sent another request for this pts gabapentin. He said to disregard this request.   10/12/17.

## 2017-10-12 NOTE — Telephone Encounter (Signed)
Noted  

## 2017-11-18 ENCOUNTER — Other Ambulatory Visit: Payer: Self-pay | Admitting: Primary Care

## 2017-11-18 DIAGNOSIS — J302 Other seasonal allergic rhinitis: Secondary | ICD-10-CM

## 2018-01-24 ENCOUNTER — Telehealth: Payer: Self-pay | Admitting: Gastroenterology

## 2018-01-24 ENCOUNTER — Emergency Department
Admission: EM | Admit: 2018-01-24 | Discharge: 2018-01-24 | Disposition: A | Discharge status: Home / Self Care | Payer: Managed Care, Other (non HMO) | Attending: Student in an Organized Health Care Education/Training Program | Admitting: Student in an Organized Health Care Education/Training Program

## 2018-01-24 ENCOUNTER — Other Ambulatory Visit: Payer: Self-pay

## 2018-01-24 ENCOUNTER — Emergency Department: Payer: Managed Care, Other (non HMO)

## 2018-01-24 DIAGNOSIS — Z880 Allergy status to penicillin: Secondary | ICD-10-CM | POA: Insufficient documentation

## 2018-01-24 DIAGNOSIS — Z79899 Other long term (current) drug therapy: Secondary | ICD-10-CM | POA: Diagnosis not present

## 2018-01-24 DIAGNOSIS — Z888 Allergy status to other drugs, medicaments and biological substances status: Secondary | ICD-10-CM | POA: Insufficient documentation

## 2018-01-24 DIAGNOSIS — R079 Chest pain, unspecified: Secondary | ICD-10-CM | POA: Insufficient documentation

## 2018-01-24 DIAGNOSIS — Z87891 Personal history of nicotine dependence: Secondary | ICD-10-CM | POA: Diagnosis not present

## 2018-01-24 LAB — CBC
HEMATOCRIT: 38.7 % (ref 35.0–47.0)
Hemoglobin: 12.9 g/dL (ref 12.0–16.0)
MCH: 27.9 pg (ref 26.0–34.0)
MCHC: 33.3 g/dL (ref 32.0–36.0)
MCV: 83.8 fL (ref 80.0–100.0)
Platelets: 327 10*3/uL (ref 150–440)
RBC: 4.62 MIL/uL (ref 3.80–5.20)
RDW: 14.6 % — AB (ref 11.5–14.5)
WBC: 8.2 10*3/uL (ref 3.6–11.0)

## 2018-01-24 LAB — BASIC METABOLIC PANEL
Anion gap: 8 (ref 5–15)
BUN: 9 mg/dL (ref 6–20)
CHLORIDE: 105 mmol/L (ref 101–111)
CO2: 25 mmol/L (ref 22–32)
Calcium: 9.2 mg/dL (ref 8.9–10.3)
Creatinine, Ser: 0.82 mg/dL (ref 0.44–1.00)
GFR calc Af Amer: >60 mL/min (ref >60)
GFR calc non Af Amer: >60 mL/min (ref >60)
GLUCOSE: 92 mg/dL (ref 65–99)
POTASSIUM: 3.7 mmol/L (ref 3.5–5.1)
Sodium: 138 mmol/L (ref 135–145)

## 2018-01-24 LAB — TROPONIN I: Troponin I: <0.03 ng/mL (ref <0.03)

## 2018-01-24 MED ORDER — DIAZEPAM 5 MG PO TABS
5.0000 mg | ORAL_TABLET | Freq: Once | ORAL | Status: AC
  Administered 2018-01-24: 5 mg via ORAL
  Filled 2018-01-24: qty 1

## 2018-01-24 MED ORDER — HYDROCODONE-ACETAMINOPHEN 5-325 MG PO TABS: 1.0000 | ORAL_TABLET | Freq: Once | ORAL | Status: DC

## 2018-01-24 MED ORDER — GI COCKTAIL ~~LOC~~
ORAL | Status: AC
  Administered 2018-01-24: 30 mL via ORAL
  Filled 2018-01-24: qty 30

## 2018-01-24 MED ORDER — HYDROCODONE-ACETAMINOPHEN 5-325 MG PO TABS
1.0000 | ORAL_TABLET | Freq: Once | ORAL | Status: AC
  Administered 2018-01-24: 1 via ORAL
  Filled 2018-01-24: qty 1

## 2018-01-24 MED ORDER — PANTOPRAZOLE SODIUM 40 MG PO TBEC: 40.0000 mg | DELAYED_RELEASE_TABLET | Freq: Every day | ORAL | 1 refills | Status: DC

## 2018-01-24 MED ORDER — GI COCKTAIL ~~LOC~~
30.0000 mL | Freq: Once | ORAL | Status: AC
  Administered 2018-01-24: 30 mL via ORAL

## 2018-01-24 MED ORDER — DIAZEPAM 5 MG PO TABS: 5.0000 mg | ORAL_TABLET | Freq: Three times a day (TID) | ORAL | 0 refills | Status: DC | PRN

## 2018-01-24 NOTE — ED Notes (Signed)
ED Provider at bedside. 

## 2018-01-24 NOTE — ED Provider Notes (Signed)
[facility] Emergency Department Provider Note    First MD Initiated Contact with Patient 01/24/18 1654     (approximate)  I have reviewed the triage vital signs and the nursing notes.   HISTORY  Chief Complaint Chest Pain    HPI Martha Page is a 37 y.o. female with a history of anxiety presents with chief complaint of epigastric and midsternal chest burning pain that occurred after the patient took clindamycin this morning initially felt that the pill had gotten stuck in her throat.  Pain started suddenly at 630.  She then took Tums, Carafate and Nexium without any improvement.  Denies any shortness of breath.  States that she has been undergoing quite a bit of stress at home due to her father being in the hospital and other home matters.  Denies any difficulty swallowing liquids but is still having a burning pain.  Past Medical History:  Diagnosis Date  . Anxiety   . Elevated liver enzymes   . Frequent headaches   . Migraine    2x/6 mos  . Pneumonia 06/30/2016   has finished antibiotics.  still has lingering nighttime cough  . Shoulder pain    Family History  Problem Relation Age of Onset  . Stroke Mother   . Depression Mother   . Heart disease Father   . Arthritis Paternal Grandmother   . Cancer Paternal Grandmother        lung  . Heart disease Paternal Grandmother   . Stroke Paternal Grandmother   . Hypertension Paternal Grandmother    Past Surgical History:  Procedure Laterality Date  . CESAREAN SECTION    . CESAREAN SECTION    . CHOLECYSTECTOMY  2003  . COLONOSCOPY WITH PROPOFOL N/A 04/17/2015   Procedure: COLONOSCOPY WITH PROPOFOL;  Surgeon: Josefine Class, MD;  Location: [facility];  Service: Endoscopy;  Laterality: N/A;  . DIAGNOSTIC LAPAROSCOPY WITH REMOVAL OF ECTOPIC PREGNANCY    . DILATION AND CURETTAGE OF UTERUS    . DUPUYTREN CONTRACTURE RELEASE    . ECTOPIC PREGNANCY SURGERY    . ESOPHAGOGASTRODUODENOSCOPY  (EGD) WITH PROPOFOL N/A 07/22/2016   Procedure: ESOPHAGOGASTRODUODENOSCOPY (EGD) WITH PROPOFOL;  Surgeon: Lucilla Lame, MD;  Location: [place];  Service: Endoscopy;  Laterality: N/A;  . GALLBLADDER SURGERY  2002  . HEMORRHOID SURGERY    . HEMORRHOID SURGERY N/A 06/05/2015   Procedure: HEMORRHOIDECTOMY;  Surgeon: Leonie Green, MD;  Location: [facility] ORS;  Service: General;  Laterality: N/A;  . INTRAUTERINE DEVICE (IUD) INSERTION    . LASIK    . TUBAL LIGATION     Patient Active Problem List   Diagnosis Date Noted  . Hot flashes 01/27/2017  . GERD (gastroesophageal reflux disease)   . Hives 06/01/2016  . Postural dizziness with presyncope 02/29/2016  . Vitamin D deficiency 10/22/2015  . Oral ulcer 10/22/2015  . Preventative health care 10/22/2015  . Other forms of stomatitis 10/22/2015  . Allergic rhinitis 04/23/2015  . Major depressive disorder, single episode 04/23/2015  . Generalized anxiety disorder 04/02/2015  . Cluster headache 04/02/2015  . Hydronephrosis 05/29/2014  . Synovitis and tenosynovitis 02/04/2014  . Tobacco use 07/09/2008      Prior to Admission medications   Medication Sig Start Date End Date Taking? Authorizing Provider  albuterol (PROVENTIL HFA;VENTOLIN HFA) 108 (90 Base) MCG/ACT inhaler Inhale 2 puffs into the lungs every 6 (six) hours as needed for wheezing or shortness of breath.    [provider]  cetirizine (  ZYRTEC) 10 MG tablet TAKE 1 TABLET BY MOUTH EVERY DAY 11/20/17   Pleas Koch, NP  diazepam (VALIUM) 5 MG tablet Take 1 tablet (5 mg total) by mouth every 8 (eight) hours as needed for muscle spasms. 01/24/18 01/24/19  Merlyn Lot, MD  gabapentin (NEURONTIN) 300 MG capsule Take 1 capsule (300 mg total) by mouth 2 (two) times daily. 01/27/17   Pleas Koch, NP  MYRBETRIQ 25 MG TB24 tablet Take 25 mg by mouth daily. 12/05/16   [provider]  pantoprazole (PROTONIX) 40 MG tablet Take 1 tablet (40 mg total)  by mouth daily. 01/24/18 01/24/19  Merlyn Lot, MD  venlafaxine XR (EFFEXOR-XR) 75 MG 24 hr capsule Take 1 capsule (75 mg total) by mouth daily with breakfast. 08/09/17   Pleas Koch, NP    Allergies Cefazolin; Codeine sulfate; Doxycycline; Red dye; Sulfa antibiotics; Amoxicillin; and Penicillin v potassium    Social History Social History   Tobacco Use  . Smoking status: Former Smoker    Last attempt to quit: 07/01/2011    Years since quitting: 6.5  . Smokeless tobacco: Never Used  Substance Use Topics  . Alcohol use: No    Alcohol/week: 0.0 oz  . Drug use: No    Review of Systems Patient denies headaches, rhinorrhea, blurry vision, numbness, shortness of breath, chest pain, edema, cough, abdominal pain, nausea, vomiting, diarrhea, dysuria, fevers, rashes or hallucinations unless otherwise stated above in HPI. ____________________________________________   PHYSICAL EXAM:  VITAL SIGNS: Vitals:   01/24/18 1540 01/24/18 1543  BP: (!) 139/92   Pulse: 92   Resp: 18   Temp:  98.5 F (36.9 C)  SpO2: 99%     Constitutional: Alert and oriented. Well appearing and in no acute distress. Eyes: Conjunctivae are normal.  Head: Atraumatic. Nose: No congestion/rhinnorhea. Mouth/Throat: Mucous membranes are moist.   Neck: No stridor. Painless ROM.  Cardiovascular: Normal rate, regular rhythm. Grossly normal heart sounds.  Good peripheral circulation. Respiratory: Normal respiratory effort.  No retractions. Lungs CTAB. Gastrointestinal: Soft and nontender. No distention. No abdominal bruits. No CVA tenderness. Genitourinary:  Musculoskeletal: No lower extremity tenderness nor edema.  No joint effusions. Neurologic:  Normal speech and language. No gross focal neurologic deficits are appreciated. No facial droop Skin:  Skin is warm, dry and intact. No rash noted. Psychiatric: Mood and affect are normal. Speech and behavior are  normal.  ____________________________________________   LABS (all labs ordered are listed, but only abnormal results are displayed)  Results for orders placed or performed during the hospital encounter of 01/24/18 (from the past 24 hour(s))  Basic metabolic panel     Status: None   Collection Time: 01/24/18  3:43 PM  Result Value Ref Range   Sodium 138 135 - 145 mmol/L   Potassium 3.7 3.5 - 5.1 mmol/L   Chloride 105 101 - 111 mmol/L   CO2 25 22 - 32 mmol/L   Glucose, Bld 92 65 - 99 mg/dL   BUN 9 6 - 20 mg/dL   Creatinine, Ser 0.82 0.44 - 1.00 mg/dL   Calcium 9.2 8.9 - 10.3 mg/dL   GFR calc non Af Amer >60 >60 mL/min   GFR calc Af Amer >60 >60 mL/min   Anion gap 8 5 - 15  CBC     Status: Abnormal   Collection Time: 01/24/18  3:43 PM  Result Value Ref Range   WBC 8.2 3.6 - 11.0 K/uL   RBC 4.62 3.80 - 5.20 MIL/uL  Hemoglobin 12.9 12.0 - 16.0 g/dL   HCT 38.7 35.0 - 47.0 %   MCV 83.8 80.0 - 100.0 fL   MCH 27.9 26.0 - 34.0 pg   MCHC 33.3 32.0 - 36.0 g/dL   RDW 14.6 (H) 11.5 - 14.5 %   Platelets 327 150 - 440 K/uL  Troponin I     Status: None   Collection Time: 01/24/18  3:43 PM  Result Value Ref Range   Troponin I <0.03 <0.03 ng/mL   ____________________________________________  EKG My review and personal interpretation at Time:  15:38  Indication: chest pain  Rate: 90  Rhythm: sinus Axis: normal  Other: normal intervals, no stemi, non specific st abn ____________________________________________  RADIOLOGY I personally reviewed all radiographic images ordered to evaluate for the above acute complaints and reviewed radiology reports and findings.  These findings were personally discussed with the patient.  Please see medical record for radiology report.   ____________________________________________   PROCEDURES  Procedure(s) performed:  Procedures    Critical Care performed: no ____________________________________________   INITIAL IMPRESSION / ASSESSMENT  AND PLAN / ED COURSE  Pertinent labs & imaging results that were available during my care of the patient were reviewed by me and considered in my medical decision making (see chart for details).  DDX: Esophagitis, esophageal spasm, PUD, gastritis, food bolus impaction, Boerhaave's, pneumonia, pneumothorax, ACS  TONYETTA BERKO is a 37 y.o. who presents to the ED with symptoms as described above.  Patient well-appearing in no acute distress.  Tolerating her secretions.  Not consistent with food bolus impaction.  Not clinically consistent with ACS or PE.  Patient low risk heart score with no EKG changes and negative troponin over 6 hours after onset of pain.  She is low risk by Wells criteria and is PERC negative.  Chest x-ray shows no mediastinal widening.  Possible esophageal stricture patient is already followed up with GI do not feel that emergent endoscopy clinically indicated at this time.  Her abdominal exam is soft and benign.  Did have improvement in symptoms after GI cocktail as well as Valium.  Will give additional dose of oral Norco which she has tolerated and is tolerating hydration.  At this point do believe patient stable and appropriate for outpatient follow-up.      As part of my medical decision making, I reviewed the following data within the [facility] notes reviewed and incorporated, Labs reviewed, notes from prior ED visits   ____________________________________________   FINAL CLINICAL IMPRESSION(S) / ED DIAGNOSES  Final diagnoses:  Chest pain, unspecified type      NEW MEDICATIONS STARTED DURING THIS VISIT:  New Prescriptions   DIAZEPAM (VALIUM) 5 MG TABLET    Take 1 tablet (5 mg total) by mouth every 8 (eight) hours as needed for muscle spasms.   PANTOPRAZOLE (PROTONIX) 40 MG TABLET    Take 1 tablet (40 mg total) by mouth daily.     Note:  This document was prepared using Dragon voice recognition software and may include unintentional  dictation errors.    Merlyn Lot, MD 01/24/18 [phone]

## 2018-01-24 NOTE — Discharge Instructions (Addendum)
Please follow-up with gastroenterologist.  Take Protonix daily.  Return the ER for worsening pain difficulty swallowing or for any additional questions or concerns.

## 2018-01-24 NOTE — Telephone Encounter (Signed)
LVM for pt to return my call.

## 2018-01-24 NOTE — Telephone Encounter (Signed)
Patient would like for you to call her. She can't swallow due to burning/pain.  She has tried several things including nexium and zofran. Please call today [phone] x [phone]

## 2018-01-24 NOTE — ED Triage Notes (Signed)
Pt c/o epigastric pain that occurred after she took her antibiotic this AM at approx 0630AM today. Pt states that she took tums, carafate, 2 40mg  nexium with no relief. Pt alert and oriented X4, active, cooperative, pt in NAD. RR even and unlabored, color WNL.  Belching continues throughout the day.

## 2018-01-25 NOTE — Telephone Encounter (Signed)
Pt was seen in the ED on 01/24/18. Pt was advised per ED physician to contact our office to schedule a follow up appt.

## 2018-02-12 ENCOUNTER — Other Ambulatory Visit: Payer: Self-pay | Admitting: Primary Care

## 2018-02-12 DIAGNOSIS — J302 Other seasonal allergic rhinitis: Secondary | ICD-10-CM

## 2018-02-14 ENCOUNTER — Ambulatory Visit (INDEPENDENT_AMBULATORY_CARE_PROVIDER_SITE_OTHER): Payer: Managed Care, Other (non HMO) | Admitting: Gastroenterology

## 2018-02-14 ENCOUNTER — Encounter: Payer: Self-pay | Admitting: *Deleted

## 2018-02-14 ENCOUNTER — Encounter: Payer: Self-pay | Admitting: Gastroenterology

## 2018-02-14 VITALS — BP 116/67 | HR 77 | Ht 65.0 in | Wt 182.0 lb

## 2018-02-14 DIAGNOSIS — M7550 Bursitis of unspecified shoulder: Secondary | ICD-10-CM | POA: Insufficient documentation

## 2018-02-14 DIAGNOSIS — R1310 Dysphagia, unspecified: Secondary | ICD-10-CM | POA: Diagnosis not present

## 2018-02-14 DIAGNOSIS — M76829 Posterior tibial tendinitis, unspecified leg: Secondary | ICD-10-CM | POA: Insufficient documentation

## 2018-02-14 NOTE — Progress Notes (Signed)
Primary Care Physician: Isaias Sakai, DO  Primary Gastroenterologist:  Dr. Lucilla Lame  Chief Complaint  Patient presents with  . Follow up ER  . Gastroesophageal Reflux  . Dysphagia    HPI: Martha Page is a 37 y.o. female here with a history of having gastritis.  The patient had an upper endoscopy back in 2017 that showed a small hiatal hernia and gastritis.  The biopsy showed chronic inactive gastritis with biopsies of the esophagus showing reflux changes all the way up to the middle of the esophagus.  The patient had a normal CBC a few weeks ago.  The patient last saw me in October 2017 for dysphasia.  The patient had recently seen her primary care provider due to return of her symptoms including heartburn and fullness in her throat after she had stopped the Protonix for a few days.  The patient reports that she was in the emergency room recently after taking Keflex for a strep throat and reports that 1 of her last pills felt like it got stuck in the esophagus and she had severe pain.  The patient was given Carafate without any help.  The patient was then given a GI cocktail in the ER and Valium with pain medication with very little help from that either.  The patient now reports that she does not have pain anymore but she still feels the sensation of food going down.  There is no report of any food getting stuck.  Current Outpatient Medications  Medication Sig Dispense Refill  . albuterol (PROVENTIL HFA;VENTOLIN HFA) 108 (90 Base) MCG/ACT inhaler Inhale 2 puffs into the lungs every 6 (six) hours as needed for wheezing or shortness of breath.    . busPIRone (BUSPAR) 7.5 MG tablet Take 7.5 mg by mouth 2 (two) times daily.  0  . cetirizine (ZYRTEC) 10 MG tablet TAKE 1 TABLET BY MOUTH EVERY DAY 90 tablet 0  . gabapentin (NEURONTIN) 300 MG capsule Take 1 capsule (300 mg total) by mouth 2 (two) times daily. 270 capsule 2  . pantoprazole (PROTONIX) 40 MG tablet Take 1 tablet  (40 mg total) by mouth daily. 30 tablet 1  . venlafaxine XR (EFFEXOR-XR) 75 MG 24 hr capsule Take 1 capsule (75 mg total) by mouth daily with breakfast. 90 capsule 0  . MYRBETRIQ 25 MG TB24 tablet Take 25 mg by mouth daily.  6   No current facility-administered medications for this visit.     Allergies as of 02/14/2018 - Review Complete 02/14/2018  Allergen Reaction Noted  . Cefazolin Rash and Shortness Of Breath 04/02/2015  . Codeine sulfate Itching 04/02/2015  . Doxycycline Other (See Comments) 04/02/2015  . Red dye Hives 06/01/2016  . Sulfa antibiotics Other (See Comments) 04/02/2015  . Amoxicillin Rash 04/02/2015  . Penicillin v potassium Rash 04/02/2015    ROS:  General: Negative for anorexia, weight loss, fever, chills, fatigue, weakness. ENT: Negative for hoarseness, difficulty swallowing , nasal congestion. CV: Negative for chest pain, angina, palpitations, dyspnea on exertion, peripheral edema.  Respiratory: Negative for dyspnea at rest, dyspnea on exertion, cough, sputum, wheezing.  GI: See history of present illness. GU:  Negative for dysuria, hematuria, urinary incontinence, urinary frequency, nocturnal urination.  Endo: Negative for unusual weight change.    Physical Examination:   BP 116/67   Pulse 77   Ht 5\' 5"  (1.651 m)   Wt 182 lb (82.6 kg)   BMI 30.29 kg/m   General: Well-nourished, well-developed in no  acute distress.  Eyes: No icterus. Conjunctivae pink. Mouth: Oropharyngeal mucosa moist and pink , no lesions erythema or exudate. Lungs: Clear to auscultation bilaterally. Non-labored. Heart: Regular rate and rhythm, no murmurs rubs or gallops.  Abdomen: Bowel sounds are normal, nontender, nondistended, no hepatosplenomegaly or masses, no abdominal bruits or hernia , no rebound or guarding.   Extremities: No lower extremity edema. No clubbing or deformities. Neuro: Alert and oriented x 3.  Grossly intact. Skin: Warm and dry, no jaundice.   Psych: Alert  and cooperative, normal mood and affect.  Labs:    Imaging Studies: Dg Chest 2 View  Result Date: 01/24/2018 CLINICAL DATA:  Chest pain EXAM: CHEST - 2 VIEW COMPARISON:  12/20/2016 FINDINGS: The heart size and mediastinal contours are within normal limits. Both lungs are clear. The visualized skeletal structures are unremarkable. IMPRESSION: No active cardiopulmonary disease. Electronically Signed   By: Inez Catalina M.D.   On: 01/24/2018 16:21    Assessment and Plan:   Martha Page is a 37 y.o. y/o female who comes in today after what sounds like a episode of pill esophagitis.  The patient does not have any pain anymore but states that she feels food going down after she eats it.  The patient has had an upper endoscopy in the past with a hiatal hernia and mid esophageal reflux changes.  The patient will be set up for a barium swallow.  This will be done to look for any strictures or narrowing or irregularities to explain her present symptoms.  The patient has been explained the plan and agrees with it.    Lucilla Lame, MD. Marval Regal   Note: This dictation was prepared with Dragon dictation along with smaller phrase technology. Any transcriptional errors that result from this process are unintentional.

## 2018-02-14 NOTE — Patient Instructions (Addendum)
You are scheduled for a Barium swallow at [facility] on Thursday, April 18th @ 1:30pm. Please arrive at the medical mall registration desk at 1:15pm. You cannot have anything to eat or drink 3 hours prior to scan.   If you need to reschedule this appointment for any reason, please contact central scheduling at [phone].

## 2018-02-22 ENCOUNTER — Ambulatory Visit
Admission: RE | Admit: 2018-02-22 | Discharge: 2018-02-22 | Disposition: A | Discharge status: Home / Self Care | Payer: Managed Care, Other (non HMO) | Source: Ambulatory Visit | Attending: Gastroenterology | Admitting: Gastroenterology

## 2018-02-22 DIAGNOSIS — R1310 Dysphagia, unspecified: Secondary | ICD-10-CM | POA: Diagnosis not present

## 2018-03-06 ENCOUNTER — Telehealth: Payer: Self-pay

## 2018-03-06 NOTE — Telephone Encounter (Signed)
-----   Message from Lucilla Lame, MD sent at 02/22/2018  5:40 PM EDT ----- That the patient know that her barium swallow showed a possible small hiatal hernia but it was not definitely seen.  No evidence of any reflux and the barium tablet passed normally into her stomach without any sign of obstruction or abnormalities.  The contractibility of the esophagus was also normal.

## 2018-03-06 NOTE — Telephone Encounter (Signed)
Pt returned my call and advised of barium swallow results.

## 2018-03-06 NOTE — Telephone Encounter (Signed)
LVM for pt to return my call.

## 2018-10-19 LAB — HM PAP SMEAR: HM PAP: NEGATIVE

## 2019-01-14 ENCOUNTER — Telehealth: Payer: Self-pay | Admitting: Gastroenterology

## 2019-01-14 NOTE — Telephone Encounter (Signed)
PT left vm she would like to speak with Ginger regarding  A problem she was having around the same time last year please call her work Number [phone] ext [phone]

## 2019-01-14 NOTE — Telephone Encounter (Signed)
Patient called again to check on her earlier message.

## 2019-01-14 NOTE — Telephone Encounter (Signed)
Spoke with pt regarding her symptoms of severe reflux. She stated she is taking doxycycline daily for 6 months now due to her acne. The epigastric pain was so severe yesterday, she took pantoprazole x 2, Pepcid, tums, mylanta and finally after all that took some Ibuprofen which help ease the discomfort. She has hardly ate because of the discomfort. Its been a year since her last appt with you. Do you want her to follow up or can you recommend something?

## 2019-01-15 ENCOUNTER — Other Ambulatory Visit: Payer: Self-pay

## 2019-01-15 MED ORDER — PANTOPRAZOLE SODIUM 40 MG PO TBEC: 40.0000 mg | DELAYED_RELEASE_TABLET | Freq: Every day | ORAL | 6 refills | Status: DC

## 2019-01-15 NOTE — Telephone Encounter (Signed)
Patient called stating Ginger was to call her 1st thing this morning.

## 2019-01-15 NOTE — Progress Notes (Signed)
dex

## 2019-01-15 NOTE — Telephone Encounter (Signed)
If the patient is having a reaction to the Dexilant that is causing her severe reflux but it is not help with a double dose of her PPI including toms Mylanta and Pepcid the near is very little that medications will make any difference.  If she continues to have discomfort on all of these medications it is likely not being caused by acid.

## 2019-01-16 ENCOUNTER — Other Ambulatory Visit: Payer: Self-pay

## 2019-01-16 DIAGNOSIS — R1310 Dysphagia, unspecified: Secondary | ICD-10-CM

## 2019-01-16 NOTE — Telephone Encounter (Signed)
If this is a drug reaction then stopping the medication is the only option. If it is pull esophagitis then there is no cure except time. If it is from reflux then she is on the maximum amount of medication for this.

## 2019-01-16 NOTE — Telephone Encounter (Signed)
Pt  Left vm she states her symptoms are getting worse she had trouble sleeping she  states she can not swallow liquids, bananas and has chest pain  she would like a call from Ginger she states she could come in this afternoon for apt if available. [phone]

## 2019-01-16 NOTE — Telephone Encounter (Signed)
I spoke with pt and she is wanting to know if there is anything else she can take for her symptoms until she can get the barium swallow? Its scheduled for this coming Tuesday.  She is already taking Pantoprazole 40mg  BID.

## 2019-01-16 NOTE — Telephone Encounter (Signed)
Pt.notified

## 2019-01-21 ENCOUNTER — Ambulatory Visit (INDEPENDENT_AMBULATORY_CARE_PROVIDER_SITE_OTHER): Payer: 59 | Admitting: Obstetrics and Gynecology

## 2019-01-21 ENCOUNTER — Other Ambulatory Visit: Payer: Self-pay

## 2019-01-21 ENCOUNTER — Encounter: Payer: Self-pay | Admitting: Obstetrics and Gynecology

## 2019-01-21 VITALS — BP 124/78 | Ht 65.0 in | Wt 195.0 lb

## 2019-01-21 DIAGNOSIS — N393 Stress incontinence (female) (male): Secondary | ICD-10-CM

## 2019-01-21 DIAGNOSIS — N3941 Urge incontinence: Secondary | ICD-10-CM

## 2019-01-21 NOTE — Progress Notes (Signed)
Obstetrics & Gynecology Office Visit   Chief Complaint: Urinary incontinence with mixed urge and stress urinary incontinence  History of Present Illness: 38 y.o. [id number] who presents to discuss a long-standing issue with mixed stress and urge urinary incontinence.  She had a urodynamics test in 2017 and it showed a mixed urge/stress incontinence picture.  She has tried anti-cholinergic medication, she has tried Mybetriq, which is too expensive and it did not work anyway.   Her biggest issue is the leaking. Also, she does have urgency with urge urinary incontinence.    When leaks it is enough to wet the middle part of her undergarments.   Over the past 7 weeks, she has been sick and coughing.  She had two rounds of antibiotics and steroids. She believes that the coughing made it much worse than it was.    She does have issues with having a bowel movement, since her gall bladder was removed. She takes Linzess, if it has a while. Usually she goes 1-2 times per week.   She has been checked for a urinary tract infection in the past and it has been negative.   She is trying a home PT kit that she would like to see if it helps.   She does not believe she has any prolapse symptoms. She does not have any bulge or other pressure symptoms.  She has no issues with intercourse.    Past Medical History:  Diagnosis Date  . Anxiety   . Elevated liver enzymes   . Frequent headaches   . Migraine    2x/6 mos  . Pneumonia 06/30/2016   has finished antibiotics.  still has lingering nighttime cough  . Shoulder pain    Past Surgical History:  Procedure Laterality Date  . CESAREAN SECTION    . CESAREAN SECTION    . CHOLECYSTECTOMY  2003  . COLONOSCOPY WITH PROPOFOL N/A 04/17/2015   Procedure: COLONOSCOPY WITH PROPOFOL;  Surgeon: Josefine Class, MD;  Location: [facility] ENDOSCOPY;  Service: Endoscopy;  Laterality: N/A;  . DIAGNOSTIC LAPAROSCOPY WITH REMOVAL OF ECTOPIC PREGNANCY    . DILATION AND  CURETTAGE OF UTERUS    . DUPUYTREN CONTRACTURE RELEASE    . ECTOPIC PREGNANCY SURGERY    . ESOPHAGOGASTRODUODENOSCOPY (EGD) WITH PROPOFOL N/A 07/22/2016   Procedure: ESOPHAGOGASTRODUODENOSCOPY (EGD) WITH PROPOFOL;  Surgeon: Lucilla Lame, MD;  Location: [place];  Service: Endoscopy;  Laterality: N/A;  . GALLBLADDER SURGERY  2002  . HEMORRHOID SURGERY    . HEMORRHOID SURGERY N/A 06/05/2015   Procedure: HEMORRHOIDECTOMY;  Surgeon: Leonie Green, MD;  Location: [facility] ORS;  Service: General;  Laterality: N/A;  . INTRAUTERINE DEVICE (IUD) INSERTION    . LASIK    . TUBAL LIGATION      Gynecologic History: No LMP recorded. (Menstrual status: IUD).  Obstetric History: [id number]  Family History  Problem Relation Age of Onset  . Stroke Mother   . Depression Mother   . Heart disease Father   . Arthritis Paternal Grandmother   . Cancer Paternal Grandmother        lung  . Heart disease Paternal Grandmother   . Stroke Paternal Grandmother   . Hypertension Paternal Grandmother     Social History   Socioeconomic History  . Marital status: Single    Spouse name: Not on file  . Number of children: Not on file  . Years of education: Not on file  . Highest education level: Not on file  Occupational History  .  Not on file  Social Needs  . Financial resource strain: Not on file  . Food insecurity:    Worry: Not on file    Inability: Not on file  . Transportation needs:    Medical: Not on file    Non-medical: Not on file  Tobacco Use  . Smoking status: Former Smoker    Last attempt to quit: 07/01/2011    Years since quitting: 7.5  . Smokeless tobacco: Never Used  Substance and Sexual Activity  . Alcohol use: No    Alcohol/week: 0.0 standard drinks  . Drug use: No  . Sexual activity: Yes    Birth control/protection: I.U.D.  Lifestyle  . Physical activity:    Days per week: Not on file    Minutes per session: Not on file  . Stress: Not on file  Relationships  .  Social connections:    Talks on phone: Not on file    Gets together: Not on file    Attends religious service: Not on file    Active member of club or organization: Not on file    Attends meetings of clubs or organizations: Not on file    Relationship status: Not on file  . Intimate partner violence:    Fear of current or ex partner: Not on file    Emotionally abused: Not on file    Physically abused: Not on file    Forced sexual activity: Not on file  Other Topics Concern  . Not on file  Social History Narrative   Married.   6 children.   Works as a Agricultural engineer.       Allergies  Allergen Reactions  . Cefazolin Rash and Shortness Of Breath  . Codeine Sulfate Itching  . Doxycycline Other (See Comments)    "stomach upset" per pt records  . Red Dye Hives  . Sulfa Antibiotics Other (See Comments)    "Stomach pain"  . Amoxicillin Rash  . Penicillin V Potassium Rash   Prior to Admission medications   Medication Sig Start Date End Date Taking? Authorizing Provider  busPIRone (BUSPAR) 10 MG tablet  01/16/19  Yes [provider]  cetirizine (ZYRTEC) 10 MG tablet TAKE 1 TABLET BY MOUTH EVERY DAY 11/20/17  Yes Pleas Koch, NP  DULoxetine (CYMBALTA) 60 MG capsule Take by mouth daily. 12/23/18  Yes [provider]  MIRENA, 52 MG, 20 MCG/24HR IUD  10/03/18  Yes [provider]  pantoprazole (PROTONIX) 40 MG tablet Take 1 tablet (40 mg total) by mouth daily. 01/15/19  Yes Lucilla Lame, MD  albuterol (PROVENTIL HFA;VENTOLIN HFA) 108 (90 Base) MCG/ACT inhaler Inhale 2 puffs into the lungs every 6 (six) hours as needed for wheezing or shortness of breath.    [provider]  busPIRone (BUSPAR) 7.5 MG tablet Take 7.5 mg by mouth 2 (two) times daily. 01/23/18   [provider]  gabapentin (NEURONTIN) 300 MG capsule Take 1 capsule (300 mg total) by mouth 2 (two) times daily. Patient not taking: Reported on 01/21/2019 01/27/17   Pleas Koch, NP   MYRBETRIQ 25 MG TB24 tablet Take 25 mg by mouth daily. 12/05/16   [provider]  venlafaxine XR (EFFEXOR-XR) 75 MG 24 hr capsule Take 1 capsule (75 mg total) by mouth daily with breakfast. 08/09/17   Pleas Koch, NP    Review of Systems  Constitutional: Negative.   HENT: Negative.   Eyes: Negative.   Respiratory: Negative.   Cardiovascular: Negative.  Gastrointestinal: Negative.   Genitourinary: Negative.   Musculoskeletal: Negative.   Skin: Negative.   Neurological: Negative.   Psychiatric/Behavioral: Negative.      Physical Exam BP 124/78   Ht '5\' 5"'$  (1.651 m)   Wt 195 lb (88.5 kg)   BMI 32.45 kg/m  No LMP recorded. (Menstrual status: IUD). Physical Exam Constitutional:      General: She is not in acute distress.    Appearance: Normal appearance.  HENT:     Head: Normocephalic and atraumatic.  Eyes:     General: No scleral icterus.    Conjunctiva/sclera: Conjunctivae normal.  Neurological:     General: No focal deficit present.     Mental Status: She is alert and oriented to person, place, and time.     Cranial Nerves: No cranial nerve deficit.  Psychiatric:        Mood and Affect: Mood normal.        Behavior: Behavior normal.        Judgment: Judgment normal.    Female chaperone present for pelvic and breast  portions of the physical exam  Assessment: 38 y.o. [id number] female here for  1. SUI (stress urinary incontinence, female)   2. Urge urinary incontinence      Plan: Problem List Items Addressed This Visit    None    Visit Diagnoses    SUI (stress urinary incontinence, female)    -  Primary   Urge urinary incontinence         Discussed options of treatment, including; continue to monitor as she just finished having respiratory symptoms that may make her symptoms seem worse.  She could also attempt a different medication, although I believe her picture sounds more consistent with a stress urinary incontinence.  We also discussed surgery  with a retropubic sling.  While I believe this may improve her leakage symptoms, there is a risk of worsening her urgency component.  She brought records from 2017 of her urodynamic study.  The study results are translated into general terms so the actual results are not available.  However the translated results indicate low bladder volumes and leakage at maximal pressure of about 220 mL of volume.  So there may be some component of volume tolerance.  With a sling procedure, she may be able to have bladder training to tolerate higher volumes.  With all this she would like to consider her options.  I did also offer to send her to see a uro-gynecologist, who specializes in more complicated cases.  She would like to consider it all and let me know what she thinks.  30 minutes spent in face to face discussion with > 50% spent in counseling,management, and coordination of care of her stress urinary incontinence and urge urinary incontinence.   Prentice Docker, MD 01/21/2019 5:13 PM

## 2019-01-22 ENCOUNTER — Ambulatory Visit
Admission: RE | Admit: 2019-01-22 | Discharge: 2019-01-22 | Disposition: A | Discharge status: Home / Self Care | Payer: 59 | Source: Ambulatory Visit | Attending: Gastroenterology | Admitting: Gastroenterology

## 2019-01-22 ENCOUNTER — Other Ambulatory Visit: Payer: Self-pay

## 2019-01-22 DIAGNOSIS — R1310 Dysphagia, unspecified: Secondary | ICD-10-CM | POA: Diagnosis present

## 2019-01-23 ENCOUNTER — Telehealth: Payer: Self-pay | Admitting: Gastroenterology

## 2019-01-23 NOTE — Telephone Encounter (Signed)
Pt is calling she saw her results and would like a call from [place] or Dr. Allen Norris to see why she is still having symptoms  cb [phone]

## 2019-01-24 NOTE — Telephone Encounter (Signed)
Pt notified. Advised pt she will be set up with a telemed appt with Dr. Allen Norris to discuss her symptoms further. I will contact pt with a date and time tomorrow, 01/25/19.

## 2019-01-24 NOTE — Telephone Encounter (Signed)
Please remind the patient that we had seen her in the past in April of last year for dysphasia without any painful swallowing.  We are only going by her symptoms that she states that she started to have pain after taking doxycycline and have been going on her symptoms and description.  We have not seen her for this problem.  I cannot tell her what is causing her symptoms although she reported it was from the doxycycline.  She could be having esophageal spasms or just esophagitis that was not seen on the x-ray.  The best way to be move forward is to have her set up for a phone consultation through telemedicine next week or trying her on baclofen for esophageal spasms.  To answer her question why she is having symptoms without speaking to her and only knowing that it started with her antibiotic, I cannot give her an answer to that.

## 2019-01-25 ENCOUNTER — Telehealth: Payer: Self-pay

## 2019-01-25 NOTE — Telephone Encounter (Signed)
Pt notified of barium swallow results.  

## 2019-01-25 NOTE — Telephone Encounter (Signed)
-----   Message from Lucilla Lame, MD sent at 01/23/2019  7:27 AM EDT ----- Let the patient know that her Swallowing study showed a small hiatal hernia but otherwise unremarkable.

## 2019-01-25 NOTE — Telephone Encounter (Signed)
LVM for pt to call office back in regards to scheduling her for her 03/24 telemed call with Dr. Allen Norris.  Thanks [employer]

## 2019-01-25 NOTE — Telephone Encounter (Signed)
Patient has been scheduled for her telemed call with Dr. Allen Norris for 01/29/19 at 11:30 am.  Advised patient she will receive a call to register at 11:15am, then Dr. Allen Norris will call her shortly after.  Thanks [employer]

## 2019-01-28 ENCOUNTER — Encounter (INDEPENDENT_AMBULATORY_CARE_PROVIDER_SITE_OTHER): Payer: Self-pay

## 2019-01-29 ENCOUNTER — Telehealth: Payer: 59 | Admitting: Gastroenterology

## 2019-02-20 ENCOUNTER — Telehealth: Payer: Self-pay | Admitting: Neurology

## 2019-02-20 NOTE — Telephone Encounter (Signed)
Due to current COVID 19 pandemic, our office is severely reducing in office visits, in order to minimize the risk to our patients and healthcare providers.  Pt understands that although there may be some limitations with this type of visit, we will take all precautions to reduce any security or privacy concerns.  Pt understands that this will be treated like an in office visit and we will file with pt's insurance, and there may be a patient responsible charge related to this service. Pt's email is [email or web address]. Pt understands that the [employer] webex software must be downloaded and operational on the device pt plans to use for the visit. Pt understands that the nurse will be calling to go over pt's chart.

## 2019-02-21 ENCOUNTER — Encounter: Payer: Self-pay | Admitting: Neurology

## 2019-02-21 NOTE — Telephone Encounter (Signed)
Called and spoke to the patient and made sure everything was up to date in the chart. Pt states she has not gotten the e-mail yet. I will re send the e-mail for her and have advised to contact us if she doesn't get it. Pt verbalized understanding. E-mail resent.

## 2019-02-21 NOTE — Addendum Note (Signed)
Addended by: Darleen Crocker on: 02/21/2019 09:58 AM   Modules accepted: Orders

## 2019-02-28 ENCOUNTER — Ambulatory Visit (INDEPENDENT_AMBULATORY_CARE_PROVIDER_SITE_OTHER): Payer: 59 | Admitting: Neurology

## 2019-02-28 ENCOUNTER — Other Ambulatory Visit: Payer: Self-pay

## 2019-02-28 ENCOUNTER — Encounter: Payer: Self-pay | Admitting: Neurology

## 2019-02-28 DIAGNOSIS — K219 Gastro-esophageal reflux disease without esophagitis: Secondary | ICD-10-CM | POA: Diagnosis not present

## 2019-02-28 DIAGNOSIS — G44029 Chronic cluster headache, not intractable: Secondary | ICD-10-CM

## 2019-02-28 DIAGNOSIS — R0683 Snoring: Secondary | ICD-10-CM

## 2019-02-28 DIAGNOSIS — F518 Other sleep disorders not due to a substance or known physiological condition: Secondary | ICD-10-CM | POA: Insufficient documentation

## 2019-02-28 DIAGNOSIS — Z720 Tobacco use: Secondary | ICD-10-CM | POA: Diagnosis not present

## 2019-02-28 DIAGNOSIS — F325 Major depressive disorder, single episode, in full remission: Secondary | ICD-10-CM

## 2019-02-28 DIAGNOSIS — G4719 Other hypersomnia: Secondary | ICD-10-CM

## 2019-02-28 DIAGNOSIS — E669 Obesity, unspecified: Secondary | ICD-10-CM

## 2019-02-28 NOTE — Patient Instructions (Signed)
Hypersomnia Hypersomnia is a condition in which a person feels very tired during the day even though he or she gets plenty of sleep at night. A person with this condition may take naps during the day and may find it very difficult to wake up from sleep. Hypersomnia may affect a person's ability to think, concentrate, drive, or remember things. What are the causes? The cause of this condition may not be known. Possible causes include:  Certain medicines.  Sleep disorders, such as narcolepsy and sleep apnea.  Injury to the head, brain, or spinal cord.  Drug or alcohol use.  Gastroesophageal reflux disease (GERD).  Tumors.  Certain medical conditions, such as depression, diabetes, or an underactive thyroid gland (hypothyroidism). What are the signs or symptoms? The main symptoms of hypersomnia include:  Feeling very tired throughout the day, regardless of how much sleep you got the night before.  Having trouble waking up. Others may find it difficult to wake you up when you are sleeping.  Sleeping for longer and longer periods at a time.  Taking naps throughout the day. Other symptoms may include:  Feeling restless, anxious, or annoyed.  Lacking energy.  Having trouble with: ? Remembering. ? Speaking. ? Thinking.  Loss of appetite.  Seeing, hearing, tasting, smelling, or feeling things that are not real (hallucinations). How is this diagnosed? This condition may be diagnosed based on:  Your symptoms and medical history.  Your sleeping habits. Your health care provider may ask you to write down your sleeping habits in a daily sleep log, along with any symptoms you have.  A series of tests that are done while you sleep (sleep study or polysomnogram).  A test that measures how quickly you can fall asleep during the day (daytime nap study or multiple sleep latency test). How is this treated? Treatment can help you manage your condition. Treatment may include:   Following a regular sleep routine.  Lifestyle changes, such as changing your eating habits, getting regular exercise, and avoiding alcohol or caffeinated beverages.  Taking medicines to make you more alert (stimulants) during the day.  Treating any underlying medical causes of hypersomnia. Follow these instructions at home: Sleep routine   Schedule the same bedtime and wake-up time each day.  Practice a relaxing bedtime routine. This may include reading, meditation, deep breathing, or taking a warm bath before going to sleep.  Get regular exercise each day. Avoid strenuous exercise in the evening hours.  Keep your sleep environment at a cooler temperature, darkened, and quiet.  Sleep with pillows and a mattress that are comfortable and supportive.  Schedule short 20-minute naps for when you feel sleepiest during the day.  Talk with your employer or teachers about your hypersomnia. If possible, adjust your schedule so that: ? You have a regular daytime work schedule. ? You can take a scheduled nap during the day. ? You do not have to work or be active at night.  Do not eat a heavy meal for a few hours before bedtime. Eat your meals at about the same times every day.  Avoid drinking alcohol or caffeinated beverages. Safety   Do not drive or use heavy machinery if you are sleepy. Ask your health care provider if it is safe for you to drive.  Wear a life jacket when swimming or spending time near water. General instructions  Take supplements and over-the-counter and prescription medicines only as told by your health care provider.  Keep a sleep log that will help  your doctor manage your condition. This may include information about: ? What time you go to bed each night. ? How often you wake up at night. ? How many hours you sleep at night. ? How often and for how long you nap during the day. ? Any observations from others, such as leg movements during sleep, sleep walking,  or snoring.  Keep all follow-up visits as told by your health care provider. This is important. Contact a health care provider if:  You have new symptoms.  Your symptoms get worse. Get help right away if:  You have serious thoughts about hurting yourself or someone else. If you ever feel like you may hurt yourself or others, or have thoughts about taking your own life, get help right away. You can go to your nearest emergency department or call:  Your local emergency services (911 in the [place]  A suicide crisis helpline, such as the [place] at (870)224-3246. This is open 24 hours a day. Summary  Hypersomnia refers to a condition in which you feel very tired during the day even though you get plenty of sleep at night.  A person with this condition may take naps during the day and may find it very difficult to wake up from sleep.  Hypersomnia may affect a person's ability to think, concentrate, drive, or remember things.  Treatment, such as following a regular sleep routine and making some lifestyle changes, can help you manage your condition. This information is not intended to replace advice given to you by your health care provider. Make sure you discuss any questions you have with your health care provider. Document Released: 10/14/2002 Document Revised: 10/26/2017 Document Reviewed: 10/26/2017 Elsevier Interactive Patient Education  2019 [employer] for Sleep Apnea  Sleep apnea is a condition in which breathing pauses or becomes shallow during sleep. Sleep apnea screening is a test to determine if you are at risk for sleep apnea. The test is easy and only takes a few minutes. Your health care provider may ask you to have this test in preparation for surgery or as part of a physical exam. What are the symptoms of sleep apnea? Common symptoms of sleep apnea include:  Snoring.  Restless sleep.  Daytime sleepiness.  Pauses in  breathing.  Choking during sleep.  Irritability.  Forgetfulness.  Trouble thinking clearly.  Depression.  Personality changes. Most people with sleep apnea are not aware that they have it. Why should I get screened? Getting screened for sleep apnea can help:  Ensure your safety. It is important for your health care providers to know whether or not you have sleep apnea, especially if you are having surgery or have other long-term (chronic) health conditions.  Improve your health and allow you to get a better night's rest. Restful sleep can help you: ? Have more energy. ? Lose weight. ? Improve high blood pressure. ? Improve diabetes management. ? Prevent stroke. ? Prevent car accidents. How is screening done? Screening usually includes being asked a list of questions about your sleep quality. Some questions you may be asked include:  Do you snore?  Is your sleep restless?  Do you have daytime sleepiness?  Has a partner or spouse told you that you stop breathing during sleep?  Have you had trouble concentrating or memory loss? If your screening test is positive, you are at risk for the condition. Further testing may be needed to confirm a diagnosis of sleep apnea. Where to find  more information You can find screening tools online or at your health care clinic. For more information about sleep apnea screening and healthy sleep, visit these websites:  Centers for Disease Control and Prevention: [employer]pl  American Sleep Apnea Association: www.sleepapnea.org Contact a health care provider if:  You think that you may have sleep apnea. Summary  Sleep apnea screening can help determine if you are at risk for sleep apnea.  It is important for your health care providers to know whether or not you have sleep apnea, especially if you are having surgery or have other chronic health conditions.  You may be asked to take a screening test for sleep apnea in  preparation for surgery or as part of a physical exam. This information is not intended to replace advice given to you by your health care provider. Make sure you discuss any questions you have with your health care provider. Document Released: 02/03/2017 Document Revised: 02/03/2017 Document Reviewed: 02/03/2017 Elsevier Interactive Patient Education  2019 [employer].

## 2019-02-28 NOTE — Progress Notes (Signed)
Virtual Visit via Video Note  I connected with Martha Page on 02/28/19 at 10:00 AM EDT by a video enabled telemedicine application and verified that I am speaking with the correct person using two identifiers.   I discussed the limitations of evaluation and management by telemedicine and the availability of in person appointments. The patient expressed understanding and agreed to proceed.    SLEEP MEDICINE CLINIC   Provider:  Larey Seat, M D  Primary Care Physician:  Philmore Pali, NP   Referring Provider: Philmore Pali, NP    HPI:  Martha Page is a 38 y.o. female , seen here in a referral by NP Lam for a sleep evaluation.   Chief complaint according to patient : 1)She has been told that she snores. She is very sleepy in daytime and this has increased over 6-7 years. Excessive daytime sleepiness has become a problem for productivity, safety.   2)She wakes up with morning headaches which are dull she also has a second type of headache that is very much suspect of being a cluster headache.  These wake her up out of sleep are shooting pains that are sharp and topical.  The pain is much more severe.  The morning headaches may wax and wane through the day.   For the cluster headaches she has to take sumatriptan or ibuprofen.  She denies any cataplectic events, sleep paralysis but she has vivid dreams throughout the night and her dreams can continue after she went to the bathroom for example.     Sleep and medical history: increasing trend of snoring louder.  Excessive daytime sleepiness has become a problem for productivity, safety. may be ongoing for 3-5 years, may be longer.  She has wondered if she may have Narcolepsy versus "just" hypersomnia. She denies any cataplectic events, sleep paralysis but she has vivid dreams throughout the night and her dreams can continue after she went to the bathroom for example.   She is taken buspirone not for anxiety, but for headaches. No  longer Gabapentin or Mirabegron.   Family medical/ sleep history: excessive daytime sleepiness is not present in other family members.   Social history: Martha Page is married, and a busy mother of 42. 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 She has never used tobacco products.      Review of Systems: Out of a complete 14 system review, the patient complains of only the following symptoms, and all other reviewed systems are negative. How likely are you to doze in the following situations: 0 = not likely, 1 = slight chance, 2 = moderate chance, 3 = high chance  Sitting and Reading? Watching Television? Sitting inactive in a public place (theater or meeting)? Lying down in the afternoon when circumstances permit? Sitting and talking to someone? Sitting quietly after lunch without alcohol? In a car, while stopped for a few minutes in traffic? As a passenger in a car for an hour without a break?  Total = 16    Epworth score 16/ 24  , snoring.  She denies any cataplectic events, sleep paralysis but she has vivid dreams throughout the night and her dreams can continue after she went to the bathroom for example.     Social History   Socioeconomic History  . Marital status: Married    Spouse name: Not on file  . Number of children: Not on file  . Years of education: Not  on file  . Highest education level: Not on file  Occupational History  . Not on file  Social Needs  . Financial resource strain: Not on file  . Food insecurity:    Worry: Not on file    Inability: Not on file  . Transportation needs:    Medical: Not on file    Non-medical: Not on file  Tobacco Use  . Smoking status: Former Smoker    Last attempt to quit: 07/01/2011    Years since quitting: 7.6  . Smokeless tobacco: Never Used  Substance and Sexual Activity  . Alcohol use: No    Alcohol/week: 0.0 standard drinks  . Drug use: No  . Sexual activity: Yes    Birth  control/protection: I.U.D.  Lifestyle  . Physical activity:    Days per week: Not on file    Minutes per session: Not on file  . Stress: Not on file  Relationships  . Social connections:    Talks on phone: Not on file    Gets together: Not on file    Attends religious service: Not on file    Active member of club or organization: Not on file    Attends meetings of clubs or organizations: Not on file    Relationship status: Not on file  . Intimate partner violence:    Fear of current or ex partner: Not on file    Emotionally abused: Not on file    Physically abused: Not on file    Forced sexual activity: Not on file  Other Topics Concern  . Not on file  Social History Narrative   Married.   6 children.   Works as a Agricultural engineer.       Family History  Problem Relation Age of Onset  . Stroke Mother   . Depression Mother   . Heart disease Father   . Arthritis Paternal Grandmother   . Cancer Paternal Grandmother        lung  . Heart disease Paternal Grandmother   . Stroke Paternal Grandmother   . Hypertension Paternal Grandmother     Past Medical History:  Diagnosis Date  . Anxiety   . Elevated liver enzymes   . Frequent headaches   . Migraine    2x/6 mos  . Pneumonia 06/30/2016   has finished antibiotics.  still has lingering nighttime cough  . Shoulder pain     Past Surgical History:  Procedure Laterality Date  . CESAREAN SECTION    . CESAREAN SECTION    . CHOLECYSTECTOMY  2003  . COLONOSCOPY WITH PROPOFOL N/A 04/17/2015   Procedure: COLONOSCOPY WITH PROPOFOL;  Surgeon: Josefine Class, MD;  Location: [facility]COPY;  Service: Endoscopy;  Laterality: N/A;  . DIAGNOSTIC LAPAROSCOPY WITH REMOVAL OF ECTOPIC PREGNANCY    . DILATION AND CURETTAGE OF UTERUS    . DUPUYTREN CONTRACTURE RELEASE    . ECTOPIC PREGNANCY SURGERY    . ESOPHAGOGASTRODUODENOSCOPY (EGD) WITH PROPOFOL N/A 07/22/2016   Procedure: ESOPHAGOGASTRODUODENOSCOPY (EGD) WITH PROPOFOL;  Surgeon:  Lucilla Lame, MD;  Location: [facility];  Service: Endoscopy;  Laterality: N/A;  . GALLBLADDER SURGERY  2002  . HEMORRHOID SURGERY    . HEMORRHOID SURGERY N/A 06/05/2015   Procedure: HEMORRHOIDECTOMY;  Surgeon: Leonie Green, MD;  Location: [facility] ORS;  Service: General;  Laterality: N/A;  . INTRAUTERINE DEVICE (IUD) INSERTION    . LASIK    . TUBAL LIGATION      Current Outpatient Medications  Medication Sig Dispense  Refill  . albuterol (PROVENTIL HFA;VENTOLIN HFA) 108 (90 Base) MCG/ACT inhaler Inhale 2 puffs into the lungs every 6 (six) hours as needed for wheezing or shortness of breath.    Marland Kitchen BIOTIN PO Take by mouth.    . busPIRone (BUSPAR) 10 MG tablet Take 10 mg by mouth 3 (three) times daily.     . cetirizine (ZYRTEC) 10 MG tablet TAKE 1 TABLET BY MOUTH EVERY DAY 90 tablet 0  . CINNAMON PO Take by mouth.    . DULoxetine (CYMBALTA) 60 MG capsule Take by mouth daily.    Marland Kitchen MIRENA, 52 MG, 20 MCG/24HR IUD     . Multiple Vitamin (MULTIVITAMIN) tablet Take 1 tablet by mouth daily.    . pantoprazole (PROTONIX) 40 MG tablet Take 1 tablet (40 mg total) by mouth daily. 60 tablet 6   No current facility-administered medications for this visit.     Allergies as of 02/28/2019 - Review Complete 02/21/2019  Allergen Reaction Noted  . Cefazolin Rash and Shortness Of Breath 04/02/2015  . Codeine sulfate Itching 04/02/2015  . Doxycycline Other (See Comments) 04/02/2015  . Red dye Hives 06/01/2016  . Sulfa antibiotics Other (See Comments) 04/02/2015  . Amoxicillin Rash 04/02/2015  . Penicillin v potassium Rash 04/02/2015    Vitals: There were no vitals taken for this visit. Last Weight:  Wt Readings from Last 1 Encounters:  01/21/19 195 lb (88.5 kg)   VZC:HYIFO is no height or weight on file to calculate BMI.     Last Height:   Ht Readings from Last 1 Encounters:  01/21/19 5\' 5"  (1.651 m)    Observations/Objective:  General: The patient is awake, alert and appears not in  acute distress. The patient is well groomed. Head: Normocephalic, atraumatic. Neck is supple. Mallampati 2,  neck circumference: 15.5 . Nasal airflow patent ,Respiratory: No SOB. Skin:  Without evidence of edema, or rash Trunk: BMI is 32. The patient's posture is erect  Neurologic exam : The patient is awake and alert, oriented to place and time.   Attention span & concentration ability appears normal.  Speech is fluent,  without  dysarthria, dysphonia or aphasia.  Mood and affect are appropriate. Cranial nerves: Pupils are equal . Extraocular movements  in vertical and horizontal planes intact . Facial motor strength is symmetric and tongue and uvula move midline. Shoulder shrug was symmetrical.  Motor exam:   Normal tone, muscle bulk and symmetric strength in upper extremities. Coordination: Hand and wrist movements  normal without evidence of ataxia, dysmetria or tremor. Gait and station: Patient walks reportedly without assistive device .   History of Present Illness/ Summary: excessive daytime sleepiness, Martha. Fransisco Beau has noted how sleepy she has gotten over the last 6 to 7 years and by now she has had to struggle to stay awake and alert while driving.  She lives in [place], her children go to her in-laws house in [place], and she works at the [employer].   She has been told that she snores.  She wakes up with morning headaches which are dull she also has a second type of headache that is very much suspect of being a cluster headache.  These wake her up out of sleep are shooting pains that are sharp and topical.  The pain is much more severe.  The morning headaches may wax and wane through the day.  For the cluster headaches she has to take sumatriptan or ibuprofen.  She denies any cataplectic events, sleep paralysis  but she has vivid dreams throughout the night and her dreams can continue after she went to the bathroom for example.     The main goal is to treat obstructive  sleep apnea, which I suspect to be present.  This should also help cluster headaches which are strictly related with hypoxemia and doing benefit from oxygen supplementation at night.  Should the patient test negative for apnea or should apnea treatment not help her excessive daytime sleepiness to be alleviated I would still consider following with a narcolepsy work-up.       Assessment and Plan:  HST ordered.    Follow Up Instructions:I ordered a home sleep test for pickup for the patient and I am still have the hope that he can meet face-to-face in about 3 months.      I discussed the assessment and treatment plan with the patient. The patient was provided an opportunity to ask questions and all were answered. The patient agreed with the plan and demonstrated an understanding of the instructions.   The patient was advised to call back or seek an in-person evaluation if the symptoms worsen or if the condition fails to improve as anticipated.  I provided 21 minutes of non-face-to-face time during this encounter.   Larey Seat, MD Larey Seat, MD 07/28/1942, 74:08 AM  Certified in Neurology by ABPN Certified in [place] by [facility] [address] [place], [place] [address]

## 2019-03-01 ENCOUNTER — Encounter: Payer: Self-pay | Admitting: Licensed Clinical Social Worker

## 2019-03-01 ENCOUNTER — Telehealth: Payer: Self-pay | Admitting: Licensed Clinical Social Worker

## 2019-03-01 NOTE — Telephone Encounter (Signed)
A genetic counseling appt has been scheduled for the pt to see Martha Page on 6/29 at 11am. Letter mailed.

## 2019-04-08 ENCOUNTER — Ambulatory Visit (INDEPENDENT_AMBULATORY_CARE_PROVIDER_SITE_OTHER): Payer: 59 | Admitting: Neurology

## 2019-04-08 ENCOUNTER — Other Ambulatory Visit: Payer: Self-pay

## 2019-04-08 DIAGNOSIS — G4710 Hypersomnia, unspecified: Secondary | ICD-10-CM | POA: Diagnosis not present

## 2019-04-08 DIAGNOSIS — E669 Obesity, unspecified: Secondary | ICD-10-CM

## 2019-04-08 DIAGNOSIS — R0683 Snoring: Secondary | ICD-10-CM

## 2019-04-08 DIAGNOSIS — G4719 Other hypersomnia: Secondary | ICD-10-CM

## 2019-04-08 DIAGNOSIS — F518 Other sleep disorders not due to a substance or known physiological condition: Secondary | ICD-10-CM

## 2019-04-16 NOTE — Procedures (Signed)
Patient Information     First Name: Martha Last Name: Page ID: [id number]  Birth Date: 10-15-1981 Age: 38 Gender: Female  Referred Provider: Philmore Pali, NP BMI: 32   Neck Circ.:  15.5" Epworth:  16   Sleep Study Information     Study Date: Apr 08, 2019 S/H/A Version: [id number] / 4.1.1528 / 32  History:   Mrs. Mankowski has been told that she snores. She is very sleepy in daytime and this has increased over 6-7 years. Excessive daytime sleepiness has become a problem for productivity, safety. She wakes up with morning headaches which are dull she also has a second type of headache that is very much suspect of being a cluster headache.  These wake her up out of sleep are shooting pains that are sharp and topical.  The pain is much more severe.  The morning headaches may wax and wane through the day. For the cluster headaches she has to take sumatriptan or ibuprofen.  She denies any cataplectic events, sleep paralysis but she has vivid dreams throughout the night and her dreams can continue after she went to the bathroom for example.     Diagnosis:     A clinically insignificant degree of sleep apnea at AHI of 2.6/h was documented but loud snoring was found , reflected in the RDI/h. None of the sleep positions in which the patient slept had been associated with a diagnostic AHI. There was further no hypoxia and normal variability of heart rate noted.     Recommendations:     NO OSA found, no intervention needed. The outcome of this HST gives no indication why the patient has hypersomnia and rules out sleep disordered breathing as a cause.  Further work up for narcolepsy/ hypersomnia is needed.   Snoring can be treated with a dental device if patient so desires.     Electronically Signed: Larey Seat, MD   04-15-2019             Sleep Summary    Oxygen Saturation Statistics     Start Study Time: End Study Time: Total Recording Time:  10:20:41 PM     8:24:42 AM 10 h, 4 min   Total Sleep Time % REM of Sleep Time:  8 h, 1 min  17.9    Mean: 95 Minimum: 92 Maximum: 99  Mean of Desaturations Nadirs (%):   94  Oxygen Desaturation %: 4-9 10-20 >20 Total  Events Number Total  4 100.0  0 0.0  0 0.0  4 100.0  Oxygen Saturation: <90 <=88 <85 <80 <70  Duration (minutes): Sleep % 0.0 0.0 0.0 0.0 0.0 0.0 0.0 0.0 0.0 0.0     Respiratory Indices      Total Events REM NREM All Night  pRDI:  123  pAHI:  18 ODI:  4  pAHIc:  1  % CSR: 0.0 17.6 4.2 1.4 0.0 14.9 1.8 0.3 0.2 15.4 2.3 0.5 0.1       Pulse Rate Statistics during Sleep (BPM)      Mean:  66 Minimum: 47 Maximum: 106    Indices are calculated using technically valid sleep time of  7 hrs, 59 min. pRDI/pAHI are calculated using oxi desaturations ? 3%  Body Position Statistics  Position Supine Prone Right Left Non-Supine  Sleep (min) 246.5 42.5 41.5 148.5 232.5  Sleep % 51.2 8.8 8.6 30.9 48.3  pRDI 20.3 8.5 19.0 8.1 10.1  pAHI 2.9 0.0 1.5 2.0 1.6  ODI  0.5 0.0 0.0 0.8 0.5     Snoring Statistics Snoring Level (dB) >40 >50 >60 >70 >80 >Threshold (45)  Sleep (min) 32.4 4.6 0.8 0.0 0.0 11.2  Sleep % 6.7 1.0 0.2 0.0 0.0 2.3    Mean: 40 dB Sleep Stages Chart 2

## 2019-04-16 NOTE — Addendum Note (Signed)
Addended by: Larey Seat on: 04/16/2019 06:06 PM   Modules accepted: Orders

## 2019-04-17 ENCOUNTER — Encounter: Payer: Self-pay | Admitting: Neurology

## 2019-04-17 ENCOUNTER — Telehealth: Payer: Self-pay | Admitting: Neurology

## 2019-04-17 NOTE — Telephone Encounter (Signed)
Thank you for catching that- yes ,for MSLT to be valid a patient has to be off 2-3 weeks of these medications.  Cymbalta weaning :  , go to every other day for 7 days, than d/c . Same for Buspar: every other day for 7 days and then d/c.  Test after 2-3 weeks post weaning. CD.

## 2019-04-17 NOTE — Telephone Encounter (Signed)
-----   Message from Larey Seat, MD sent at 04/16/2019  6:06 PM EDT ----- Diagnosis:   A clinically insignificant degree of sleep apnea at AHI of 2.6/h  was documented but loud snoring was found , reflected in the  RDI/h. None of the sleep positions in which the patient slept had  been associated with a diagnostic AHI. There was further no  hypoxia and normal variability of heart rate noted.    Recommendations:   NO OSA found, no intervention needed. The outcome of this HST gives no indication why the patient has  hypersomnia and rules out sleep disordered breathing as a cause.  Further work up for narcolepsy/ hypersomnia is needed.  Snoring can be treated with a dental device if patient so  desires.   Electronically Signed: Larey Seat, MD  04-15-2019

## 2019-04-17 NOTE — Telephone Encounter (Signed)
Called the pt and informed her that sleep study results were normal and negative for any apnea or organic sleep disorder. Advised the patient that Dr Brett Fairy recommends completing a narcolepsy evaluation. With this informed what the testing looks like and advised that she would have to wean off cymbalta for sure. I will check with Dr Brett Fairy about her other medication and have her look at her list to see if any other meds would have to be stopped to assess capability for being able to complete the test and the time frame must be off prior to test. Pt verbalized understanding.

## 2019-05-03 ENCOUNTER — Telehealth: Payer: Self-pay | Admitting: Licensed Clinical Social Worker

## 2019-05-03 NOTE — Telephone Encounter (Signed)
Called patient regarding upcoming Webex appointment, left a voice mail. Since there was no communication to set up a virtual visit this will be a walk-in visit.

## 2019-05-03 NOTE — Telephone Encounter (Signed)
Patient returned phone call regarding voicemail that was left, patient is notified about upcoming Webex appointment and an e-mail has been sent.

## 2019-05-06 ENCOUNTER — Other Ambulatory Visit: Payer: 59

## 2019-05-06 ENCOUNTER — Inpatient Hospital Stay: Payer: 59 | Attending: Genetic Counselor | Admitting: Licensed Clinical Social Worker

## 2019-05-07 ENCOUNTER — Other Ambulatory Visit: Payer: Self-pay | Admitting: Obstetrics and Gynecology

## 2019-05-07 DIAGNOSIS — N3946 Mixed incontinence: Secondary | ICD-10-CM

## 2019-05-21 NOTE — Telephone Encounter (Signed)
Can you fax the records per the patient request? Not sure what happened.Martha KitchenMarland Page

## 2019-06-05 ENCOUNTER — Ambulatory Visit (INDEPENDENT_AMBULATORY_CARE_PROVIDER_SITE_OTHER): Payer: 59 | Admitting: Neurology

## 2019-06-05 ENCOUNTER — Other Ambulatory Visit: Payer: Self-pay

## 2019-06-05 DIAGNOSIS — J309 Allergic rhinitis, unspecified: Secondary | ICD-10-CM

## 2019-06-05 DIAGNOSIS — G44029 Chronic cluster headache, not intractable: Secondary | ICD-10-CM

## 2019-06-05 DIAGNOSIS — G4733 Obstructive sleep apnea (adult) (pediatric): Secondary | ICD-10-CM | POA: Diagnosis not present

## 2019-06-05 DIAGNOSIS — G4719 Other hypersomnia: Secondary | ICD-10-CM

## 2019-06-05 DIAGNOSIS — E669 Obesity, unspecified: Secondary | ICD-10-CM

## 2019-06-05 DIAGNOSIS — R0683 Snoring: Secondary | ICD-10-CM

## 2019-06-05 DIAGNOSIS — F518 Other sleep disorders not due to a substance or known physiological condition: Secondary | ICD-10-CM

## 2019-06-10 NOTE — Procedures (Signed)
PATIENT'S NAME:  Martha Page, Martha Page DOB:      Jun 24, 1981      MR#:    [id number]     DATE OF RECORDING: 06/05/2019 REFERRING M.D.:  Charlott Holler NP Study Performed:   Baseline Polysomnogram HISTORY: Martha Page is a 38 year old female and seen by video consultation on 02-28-2019.  Chief complaint at the time according to patient:  1) She has been told that she snores.  2) She is very sleepy in daytime and this has increased over 6-7 years. Excessive daytime sleepiness has become a problem for productivity, safety. 3) Cluster headaches are sleep related headaches and can improve with treatment of sleep disordered breathing. HST was ordered (we were limited to HST due to the Covid pandemic) and performed 04-15-2019, it resulted in a clinically insignificant degree of sleep apnea at AHI of 2.6/h., accompanied by loud snoring ( RDI 15.4/h) . There was further no hypoxia and a normal variability of heart rate noted. She returned now for PSG with MSLT to follow.   The patient endorsed the Epworth Sleepiness Scale at 16 points.   The patient's weight 195 pounds with a height of 65 (inches), resulting in a BMI of 32.3 kg/m2. The patient's neck circumference measured 15.5 inches.  CURRENT MEDICATIONS: Proventil, Biotin, Buspar, Zyrtec, Cinnamon, Cymbalta, Mirena IUD, Multivitamin, Protonix   PROCEDURE:  This is a multichannel digital polysomnogram utilizing the Somnostar 11.2 system.  Electrodes and sensors were applied and monitored per AASM Specifications.   EEG, EOG, Chin and Limb EMG, were sampled at 200 Hz.  ECG, Snore and Nasal Pressure, Thermal Airflow, Respiratory Effort, CPAP Flow and Pressure, Oximetry was sampled at 50 Hz. Digital video and audio were recorded.      BASELINE STUDY: Lights Out was at 21:05 and Lights On at 04:38.  Total recording time (TRT) was 454 minutes, with a total sleep time (TST) of 331 minutes.  The patient's sleep latency was 74.5 minutes.  REM latency was 73 minutes.  The  sleep efficiency was 72.9 %.     SLEEP ARCHITECTURE: WASO (Wake after sleep onset) was 40 minutes.  There were 34.5 minutes in Stage N1, 183 minutes Stage N2, 47.5 minutes Stage N3 and 66 minutes in Stage REM.  The percentage of Stage N1 was 10.4%, Stage N2 was 55.3%, Stage N3 was 14.4% and Stage R (REM sleep) was 19.9%.   RESPIRATORY ANALYSIS:  There were a total of 174 respiratory events:  0 apneas and 174 hypopneas.    The total APNEA/HYPOPNEA INDEX (AHI) was 31.5 /hour.  24 events occurred in REM sleep and 300 events in NREM. The REM AHI was 21.8 /hour, versus a non-REM AHI of 34.0/h.  The patient spent 143.5 minutes of total sleep time in the supine position and 188 minutes in non-supine. The supine AHI was 51.4 versus a non-supine AHI of 16.3.  OXYGEN SATURATION & C02:  The Wake baseline 02 saturation was 97%, with the lowest being 87%. Time spent below 89% saturation equaled 1 minute.  The arousals were noted as: 175 were spontaneous, 4 were associated with PLMs, and 144 were associated with respiratory events. The patient had a total of 22 Periodic Limb Movements.  The Periodic Limb Movement (PLM) index was 4.0/h and the PLM Arousal index was 0.7/hour.  Audio and video analysis did not show any abnormal or unusual movements, behaviors, phonations or vocalizations.   Snoring was noted. EKG was in keeping with normal sinus rhythm (NSR). Post-study, the  patient indicated that sleep was shorter and less refreshing than usual.    IMPRESSION:  1. Surprising contrast in relation to the recent HST results: Severe Obstructive Sleep Apnea (OSA) with an overall AHI of 31.4/h and AHI of 36/h in supine REM and AHI of 53.9/h in supine NREM sleep (!).   2. Prolonged sleep latency. 3. Mild PLMs.  4. Primary Snoring. 5. Intermittent brief hypoxemia episodes in supine sleep that were cause of some arousals (these were scored as spontaneous).     RECOMMENDATIONS:  1. Not valid for MSLT to follow as  the patient has obviously a high degree of sleep apnea.   2. NREM AHI was higher than REM AHI and I advise either; 1) full-night, attended, CPAP titration study to optimize therapy, or 2) an autotitration CPAP device, with heated humidity, a pressure window between 5-17 cm water with 3 cm water EPR and interface of patient's choice.      I certify that I have reviewed the entire raw data recording prior to the issuance of this report in accordance with the Standards of Accreditation of the American Academy of Sleep Medicine (AASM)    Larey Seat, MD   06-10-2019 Diplomat, American Board of Psychiatry and Neurology  Diplomat, American Board of [place] Director, [place] [facility] [employer]

## 2019-06-10 NOTE — Addendum Note (Signed)
Addended by: Larey Seat on: 06/10/2019 01:26 PM   Modules accepted: Orders

## 2019-06-11 ENCOUNTER — Telehealth: Payer: Self-pay | Admitting: Neurology

## 2019-06-11 NOTE — Telephone Encounter (Signed)
-----   Message from Larey Seat, MD sent at 06/10/2019  1:26 PM EDT ----- What a surprise: the HST failed this patient completely!   IMPRESSION:   1. Surprising contrast in relation to the recent HST results:  Severe Obstructive Sleep Apnea (OSA) with an overall AHI of  31.4/h and AHI of 36/h in supine REM and AHI of 53.9/h in supine  NREM sleep (!).   2. Prolonged sleep latency.  3. Mild PLMs.  4. Primary Snoring.  5. Intermittent brief hypoxemia episodes in supine sleep that  were cause of some arousals (these were scored as spontaneous).     RECOMMENDATIONS:   1. This PSG was not valid for MSLT to follow as the patient has obviously a  high degree of sleep apnea.    2. NREM AHI was higher than REM AHI and I advise either:  1)  full-night, attended, CPAP titration study to optimize therapy,  or  2) an autotitration CPAP device, with heated humidity, a  pressure window between 5-17 cm water with 3 cm water EPR and  interface of patient's choice.

## 2019-06-11 NOTE — Telephone Encounter (Signed)
I called pt. I advised pt that Dr. Brett Fairy reviewed their sleep study results and found that had severe sleep apnea and recommends that pt be treated with a cpap. Dr. Brett Fairy recommends that pt return for a repeat sleep study in order to properly titrate the cpap and ensure a good mask fit. Pt is agreeable to returning for a titration study. I advised pt that our sleep lab will file with pt's insurance and call pt to schedule the sleep study when we hear back from the pt's insurance regarding coverage of this sleep study. Pt verbalized understanding of results. Pt had no questions at this time but was encouraged to call back if questions arise.

## 2019-07-03 ENCOUNTER — Other Ambulatory Visit: Payer: Self-pay

## 2019-07-03 ENCOUNTER — Ambulatory Visit (INDEPENDENT_AMBULATORY_CARE_PROVIDER_SITE_OTHER): Payer: 59 | Admitting: Neurology

## 2019-07-03 DIAGNOSIS — G44029 Chronic cluster headache, not intractable: Secondary | ICD-10-CM

## 2019-07-03 DIAGNOSIS — J309 Allergic rhinitis, unspecified: Secondary | ICD-10-CM

## 2019-07-03 DIAGNOSIS — G4719 Other hypersomnia: Secondary | ICD-10-CM

## 2019-07-03 DIAGNOSIS — G4733 Obstructive sleep apnea (adult) (pediatric): Secondary | ICD-10-CM | POA: Diagnosis not present

## 2019-07-03 DIAGNOSIS — R0683 Snoring: Secondary | ICD-10-CM

## 2019-07-03 DIAGNOSIS — E669 Obesity, unspecified: Secondary | ICD-10-CM

## 2019-07-07 DIAGNOSIS — G4733 Obstructive sleep apnea (adult) (pediatric): Secondary | ICD-10-CM | POA: Insufficient documentation

## 2019-07-07 NOTE — Procedures (Signed)
PATIENT'S NAME:  Martha Page, Martha Page DOB:      12-11-1980      MR#:    [id number]     DATE OF RECORDING: 07/03/2019   BH REFERRING:              Charlott Holler, NP Study Performed:   CPAP  Titration HISTORY: This 38 year old female patient was seen in a Telehealth consultation on 02-28-2019, and referred for a HST / PSG based on complaint of EDS with consideration of Sleep Apnea versus narcolepsy.  The PSG was performed on 06-05-2019 and revealed a total APNEA/HYPOPNEA INDEX (AHI) of 31.5 /hour. No hypoxemia was noted, mild PLMD was recorded. The diagnosis was severe sleep hypopnea. Remarkably, the AHI was higher in NREM sleep and also in supine sleep. The non-REM AHI of 34.0/h, supine AHI was 51.4/h. The patient endorsed the Epworth Sleepiness Scale at 16/24 points.   The patient's weight 195 pounds with a height of 65 (inches), resulting in a BMI of 32.3 kg/m2.  CURRENT MEDICATIONS: Proventil, Biotin, Buspar, Zyrtec, Cinnamon, Cymbalta, Mirena IUD, Multivitamin, Protonix   PROCEDURE:  This is a multichannel digital polysomnogram utilizing the SomnoStar 11.2 system.  Electrodes and sensors were applied and monitored per AASM Specifications.   EEG, EOG, Chin and Limb EMG, were sampled at 200 Hz.  ECG, Snore and Nasal Pressure, Thermal Airflow, Respiratory Effort, CPAP Flow and Pressure, Oximetry was sampled at 50 Hz. Digital video and audio were recorded.       CPAP was initiated at 5 cmH20 with heated humidity per AASM split night standards and pressure was advanced to a final setting of 14 cmH20 because of hypopneas, apneas and desaturations.  At a 14 cm water PAP pressure the AHI became 0.0/h and the patient slept 83 minutes with SpO2 nadir at 93%   There was a significant improvement of sleep apnea and good tolerance for a ResMed P10 nasal pillow in small size.  Lights Out was at 20:58 and Lights On at 04:30. Total recording time (TRT) was 453 minutes, with a total sleep time (TST) of 382 minutes. The  patient's sleep latency was 52 minutes. REM latency was 372 minutes.  The sleep efficiency was 84.3 %.    SLEEP ARCHITECTURE: WASO (Wake after sleep onset) was 19 minutes.  There were 26.5 minutes in Stage N1, 262 minutes Stage N2, 66 minutes Stage N3 and 27.5 minutes in Stage REM.  The percentage of Stage N1 was 6.9%, Stage N2 was 68.6%, Stage N3 was 17.3% and Stage R (REM sleep) was 7.2%. The sleep architecture was notable for prolonged REM latency.  RESPIRATORY ANALYSIS:  There was a total of 17 respiratory events: 2 obstructive apneas, 0 central apneas and 15 hypopneas.      The total APNEA/HYPOPNEA INDEX (AHI) was 2.7 /hour . 0 events occurred in REM sleep and 17 events in NREM. The REM AHI was 0 /hour versus a non-REM AHI of 2.9 /hour.  The patient spent 239 minutes of total sleep time in the supine position and 143 minutes in non-supine. The supine AHI was 1.5, versus a non-supine AHI of 4.6/h.  OXYGEN SATURATION & C02:  The baseline 02 saturation was 98%, with the lowest being 90%. Time spent below 89% saturation equaled 0 minutes. The arousals were noted as: 59 were spontaneous, 0 were associated with PLMs, and 16 were associated with respiratory events. The patient had a total of 0 Periodic Limb Movements.   Audio and video analysis did not  show any abnormal or unusual movements, behaviors, phonations or vocalizations.   EKG was in keeping with normal sinus rhythm (NSR).  DIAGNOSIS 1. Primary Hypopnea/ Obstructive Sleep Apnea was alleviated at CPAP pressure of 14 cm water, using a nasal pillow mask. No PLM breakthrough and no hypoxemia of significance. Good tolerance for CPAP.   PLANS/RECOMMENDATIONS: 1. Follow-up to see if Epworth Sleepiness Score responds adequately to CPAP therapy.  2. Repeat/ discuss sleep hygiene measures.   3. Try to achieve lean body weight.   DISCUSSION: I will order an autotitration capable CPAP machine with heated humidity and mask of choice - the patient  tolerated a P10 in small size well, but this model loses elasticity quickly. Will ask DME to try a Bella swift instead.   A follow up appointment will be scheduled in the Sleep Clinic at [facility].   Please call [phone] with any questions.      I certify that I have reviewed the entire raw data recording prior to the issuance of this report in accordance with the Standards of Accreditation of the American Academy of Sleep Medicine (AASM)    Larey Seat, M.D.   07-07-2019  Diplomat, American Board of Psychiatry and Neurology  Diplomat, [place] of Sleep Medicine Medical Director, [place] [facility]

## 2019-07-07 NOTE — Addendum Note (Signed)
Addended by: Larey Seat on: 07/07/2019 03:04 PM   Modules accepted: Orders

## 2019-07-10 ENCOUNTER — Telehealth: Payer: Self-pay

## 2019-07-10 NOTE — Telephone Encounter (Signed)
I called pt. I advised pt that Dr. Brett Fairy reviewed their sleep study results and found that pt did well with a cpap during her latest sleep study. Dr. Brett Fairy recommends that pt start an auto pap at home. I reviewed PAP compliance expectations with the pt. Pt is agreeable to starting a CPAP. I advised pt that an order will be sent to a DME, [facility], and Huey Romans will call the pt within about one week after they file with the pt's insurance. Huey Romans will show the pt how to use the machine, fit for masks, and troubleshoot the CPAP if needed. A follow up appt was made for insurance purposes with Amy, NP on 09/18/2019 at 3:30pm. Pt verbalized understanding to arrive 15 minutes early and bring their CPAP. A letter with all of this information in it will be mailed to the pt as a reminder. I verified with the pt that the address we have on file is correct. Pt verbalized understanding of results. Pt had no questions at this time but was encouraged to call back if questions arise. I have sent the order to [facility] and have received confirmation that they have received the order.

## 2019-07-10 NOTE — Telephone Encounter (Signed)
-----   Message from Larey Seat, MD sent at 07/07/2019  3:04 PM EDT ----- DIAGNOSIS  1. Primary Hypopnea/ Obstructive Sleep Apnea was alleviated at  CPAP pressure of 14 cm water, using a nasal pillow mask. No PLM  breakthrough and no hypoxemia of significance. Good tolerance for  CPAP.   PLANS/RECOMMENDATIONS:  1. Follow-up to see if Epworth Sleepiness Score responds  adequately to CPAP therapy.  2. Repeat/ discuss sleep hygiene measures.   3. Try to achieve lean body weight.   DISCUSSION: I will order an autotitration capable CPAP machine  with heated humidity and mask of choice - the patient tolerated a  P10 in small size well, but this model loses elasticity quickly.  Will ask DME to try a Bella swift instead.

## 2019-09-17 NOTE — Patient Instructions (Addendum)
Continue CPAP nightly and for greater than 4 hours each night  Continue follow up with PCP for headaches. We are happy to assist if needed. Consider Emgality or oxygen for cluster type headaches.   Follow up with me in 6 months    Cluster Headache Cluster headaches are deeply painful. They normally occur on one side of your head, but they may switch sides. Often, cluster headaches:  Are severe.  Happen often for a few weeks or months and then go away for a while.  Last from 15 minutes to 3 hours.  Happen at the same time each day.  Happen at night.  Happen many times a day. Follow these instructions at home:        Follow instructions from your doctor to care for yourself at home:  Go to bed at the same time each night. Get the same amount of sleep every night.  Avoid alcohol.  Stop smoking if you smoke. This includes cigarettes and e-cigarettes.  Take over-the-counter and prescription medicines only as told by your doctor.  Do not drive or use heavy machinery while taking prescription pain medicine.  Use oxygen as told by your doctor.  Exercise regularly.  Eat a healthy diet.  Write down when each headache happened, what kind of pain you had, how bad your pain was, and what you tried to help your pain. This is called a headache diary. Use it as told by your doctor. Contact a doctor if:  Your headaches get worse or they happen more often.  Your medicines are not helping. Get help right away if:  You pass out (faint).  You get weak or lose feeling (have numbness) on one side of your body or face.  You see two of everything (double vision).  You feel sick to your stomach (nauseous) or you throw up (vomit), and you do not stop after many hours.  You have trouble with your balance or with walking.  You have trouble talking.  You have neck pain or stiffness.  You have a fever. This information is not intended to replace advice given to you by your  health care provider. Make sure you discuss any questions you have with your health care provider. Document Released: 12/01/2004 Document Revised: 01/19/2018 Document Reviewed: 07/01/2016 Elsevier Patient Education  2020 [place] Headache Without Cause A headache is pain or discomfort that is felt around the head or neck area. There are many causes and types of headaches. In some cases, the cause may not be found. Follow these instructions at home: Watch your condition for any changes. Let your doctor know about them. Take these steps to help with your condition: Managing pain      Take over-the-counter and prescription medicines only as told by your doctor.  Lie down in a dark, quiet room when you have a headache.  If told, put ice on your head and neck area: ? Put ice in a plastic bag. ? Place a towel between your skin and the bag. ? Leave the ice on for 20 minutes, 2-3 times per day.  If told, put heat on the affected area. Use the heat source that your doctor recommends, such as a moist heat pack or a heating pad. ? Place a towel between your skin and the heat source. ? Leave the heat on for 20-30 minutes. ? Remove the heat if your skin turns bright red. This is very important if you are unable to feel  pain, heat, or cold. You may have a greater risk of getting burned.  Keep lights dim if bright lights bother you or make your headaches worse. Eating and drinking  Eat meals on a regular schedule.  If you drink alcohol: ? Limit how much you use to:  0-1 drink a day for women.  0-2 drinks a day for men. ? Be aware of how much alcohol is in your drink. In the [place] one drink equals one 12 oz bottle of beer (355 mL), one 5 oz glass of wine (148 mL), or one 1 oz glass of hard liquor (44 mL).  Stop drinking caffeine, or reduce how much caffeine you drink. General instructions   Keep a journal to find out if certain things bring on headaches. For example,  write down: ? What you eat and drink. ? How much sleep you get. ? Any change to your diet or medicines.  Get a massage or try other ways to relax.  Limit stress.  Sit up straight. Do not tighten (tense) your muscles.  Do not use any products that contain nicotine or tobacco. This includes cigarettes, e-cigarettes, and chewing tobacco. If you need help quitting, ask your doctor.  Exercise regularly as told by your doctor.  Get enough sleep. This often means 7-9 hours of sleep each night.  Keep all follow-up visits as told by your doctor. This is important. Contact a doctor if:  Your symptoms are not helped by medicine.  You have a headache that feels different than the other headaches.  You feel sick to your stomach (nauseous) or you throw up (vomit).  You have a fever. Get help right away if:  Your headache gets very bad quickly.  Your headache gets worse after a lot of physical activity.  You keep throwing up.  You have a stiff neck.  You have trouble seeing.  You have trouble speaking.  You have pain in the eye or ear.  Your muscles are weak or you lose muscle control.  You lose your balance or have trouble walking.  You feel like you will pass out (faint) or you pass out.  You are mixed up (confused).  You have a seizure. Summary  A headache is pain or discomfort that is felt around the head or neck area.  There are many causes and types of headaches. In some cases, the cause may not be found.  Keep a journal to help find out what causes your headaches. Watch your condition for any changes. Let your doctor know about them.  Contact a doctor if you have a headache that is different from usual, or if your headache is not helped by medicine.  Get help right away if your headache gets very bad, you throw up, you have trouble seeing, you lose your balance, or you have a seizure. This information is not intended to replace advice given to you by your health  care provider. Make sure you discuss any questions you have with your health care provider. Document Released: 08/02/2008 Document Revised: 05/14/2018 Document Reviewed: 05/14/2018 Elsevier Patient Education  [place].   Sleep Apnea Sleep apnea affects breathing during sleep. It causes breathing to stop for a short time or to become shallow. It can also increase the risk of:  Heart attack.  Stroke.  Being very overweight (obese).  Diabetes.  Heart failure.  Irregular heartbeat. The goal of treatment is to help you breathe normally again. What are the causes? There are three  kinds of sleep apnea:  Obstructive sleep apnea. This is caused by a blocked or collapsed airway.  Central sleep apnea. This happens when the brain does not send the right signals to the muscles that control breathing.  Mixed sleep apnea. This is a combination of obstructive and central sleep apnea. The most common cause of this condition is a collapsed or blocked airway. This can happen if:  Your throat muscles are too relaxed.  Your tongue and tonsils are too large.  You are overweight.  Your airway is too small. What increases the risk?  Being overweight.  Smoking.  Having a small airway.  Being older.  Being female.  Drinking alcohol.  Taking medicines to calm yourself (sedatives or tranquilizers).  Having family members with the condition. What are the signs or symptoms?  Trouble staying asleep.  Being sleepy or tired during the day.  Getting angry a lot.  Loud snoring.  Headaches in the morning.  Not being able to focus your mind (concentrate).  Forgetting things.  Less interest in sex.  Mood swings.  Personality changes.  Feelings of sadness (depression).  Waking up a lot during the night to pee (urinate).  Dry mouth.  Sore throat. How is this diagnosed?  Your medical history.  A physical exam.  A test that is done when you are sleeping (sleep  study). The test is most often done in a sleep lab but may also be done at home. How is this treated?   Sleeping on your side.  Using a medicine to get rid of mucus in your nose (decongestant).  Avoiding the use of alcohol, medicines to help you relax, or certain pain medicines (narcotics).  Losing weight, if needed.  Changing your diet.  Not smoking.  Using a machine to open your airway while you sleep, such as: ? An oral appliance. This is a mouthpiece that shifts your lower jaw forward. ? A CPAP device. This device blows air through a mask when you breathe out (exhale). ? An EPAP device. This has valves that you put in each nostril. ? A BPAP device. This device blows air through a mask when you breathe in (inhale) and breathe out.  Having surgery if other treatments do not work. It is important to get treatment for sleep apnea. Without treatment, it can lead to:  High blood pressure.  Coronary artery disease.  In men, not being able to have an erection (impotence).  Reduced thinking ability. Follow these instructions at home: Lifestyle  Make changes that your doctor recommends.  Eat a healthy diet.  Lose weight if needed.  Avoid alcohol, medicines to help you relax, and some pain medicines.  Do not use any products that contain nicotine or tobacco, such as cigarettes, e-cigarettes, and chewing tobacco. If you need help quitting, ask your doctor. General instructions  Take over-the-counter and prescription medicines only as told by your doctor.  If you were given a machine to use while you sleep, use it only as told by your doctor.  If you are having surgery, make sure to tell your doctor you have sleep apnea. You may need to bring your device with you.  Keep all follow-up visits as told by your doctor. This is important. Contact a doctor if:  The machine that you were given to use during sleep bothers you or does not seem to be working.  You do not get  better.  You get worse. Get help right away if:  Your chest  hurts.  You have trouble breathing in enough air.  You have an uncomfortable feeling in your back, arms, or stomach.  You have trouble talking.  One side of your body feels weak.  A part of your face is hanging down. These symptoms may be an emergency. Do not wait to see if the symptoms will go away. Get medical help right away. Call your local emergency services (911 in the [place] Do not drive yourself to the hospital. Summary  This condition affects breathing during sleep.  The most common cause is a collapsed or blocked airway.  The goal of treatment is to help you breathe normally while you sleep. This information is not intended to replace advice given to you by your health care provider. Make sure you discuss any questions you have with your health care provider. Document Released: 08/02/2008 Document Revised: 08/10/2018 Document Reviewed: 06/19/2018 Elsevier Patient Education  2020 [employer].

## 2019-09-17 NOTE — Progress Notes (Signed)
PATIENT: Martha Page DOB: Sep 22, 1981  REASON FOR VISIT: follow up HISTORY FROM: patient  Chief Complaint  Patient presents with   Follow-up    Initial auto pap f/u. Alone. Rm 2. Patient mentioned that even with using her auto pap machine she is still waking up with headaches.      HISTORY OF PRESENT ILLNESS: Today 09/18/19 PRESCOTT NARES is a 38 y.o. female here today for follow up of OSA on CPAP. She feels that she may have less headaches than she did. She continues to have regular headaches managed by PCP. She has cluster and migrainous symptoms. She uses ibuprofen and sumatriptan as needed for abortive therapy.  She does have a history of seasonal allergies.  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  They are sleeping with her at night.  Reports she feels better rested in the mornings. She has adjusted well to CPAP therapy.  Compliance report dated 08/18/2019 through 09/16/2019 reveals that she is using CPAP 30 of the last 30 days for compliance of 100%. 24 days she used CPAP greater than 4 hours for compliance of 80%.  Average usage was 6 hours and 3 minutes.  AHI was 0.5 on 8 to 16 cm of water and EPR of 2.  There was no significant leak noted.   HISTORY: (copied from Dr Dohmeier's note on 02/28/2019)  HPI:  Martha Page is a 38 y.o. female , seen here in a referral by NP Lam for a sleep evaluation.  Chief complaint according to patient : 1)She has been told that she snores. She is very sleepy in daytime and this has increased over 6-7 years. Excessive daytime sleepiness has become a problem for productivity, safety.  2)She wakes up with morning headaches which are dull she also has a second type of headache that is very much suspect of being a cluster headache.  These wake her up out of sleep are shooting pains that are sharp and topical.  The pain is much more severe.  The morning headaches may wax and wane through the day.   For the cluster headaches she has to take  sumatriptan or ibuprofen.  She denies any cataplectic events, sleep paralysis but she has vivid dreams throughout the night and her dreams can continue after she went to the bathroom for example.    Sleep and medical history: increasing trend of snoring louder.  Excessive daytime sleepiness has become a problem for productivity, safety. may be ongoing for 3-5 years, may be longer.  She has wondered if she may have Narcolepsy versus "just" hypersomnia. She denies any cataplectic events, sleep paralysis but she has vivid dreams throughout the night and her dreams can continue after she went to the bathroom for example.   She is taken buspirone not for anxiety, but for headaches. No longer Gabapentin or Mirabegron.   Family medical/ sleep history: excessive daytime sleepiness is not present in other family members.   Social history: Mrs Delille is married, and a busy mother of 58. 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 She has never used tobacco products.    REVIEW OF SYSTEMS: Out of a complete 14 system review of symptoms, the patient complains only of the following symptoms, headaches and all other reviewed systems are negative.  Epworth sleepiness scale: 4 Fatigue severity scale: 18  ALLERGIES: Allergies  Allergen Reactions   Cefazolin Rash and Shortness Of Breath   Codeine  Sulfate Itching   Doxycycline Other (See Comments)    "stomach upset" per pt records   Red Dye Hives   Sulfa Antibiotics Other (See Comments)    "Stomach pain"   Amoxicillin Rash   Penicillin V Potassium Rash    HOME MEDICATIONS: Outpatient Medications Prior to Visit  Medication Sig Dispense Refill   BIOTIN PO Take by mouth.     buPROPion (WELLBUTRIN XL) 150 MG 24 hr tablet Take 150 mg by mouth daily.     cetirizine (ZYRTEC) 10 MG tablet TAKE 1 TABLET BY MOUTH EVERY DAY 90 tablet 0   DULoxetine (CYMBALTA) 60 MG capsule Take by mouth daily.     EPINEPHrine 0.3  mg/0.3 mL IJ SOAJ injection Inject 0.3 mg into the muscle once.     MIRENA, 52 MG, 20 MCG/24HR IUD      Multiple Vitamin (MULTIVITAMIN) tablet Take 1 tablet by mouth daily.     pantoprazole (PROTONIX) 40 MG tablet Take 1 tablet (40 mg total) by mouth daily. 60 tablet 6   albuterol (PROVENTIL HFA;VENTOLIN HFA) 108 (90 Base) MCG/ACT inhaler Inhale 2 puffs into the lungs every 6 (six) hours as needed for wheezing or shortness of breath.     busPIRone (BUSPAR) 10 MG tablet Take 10 mg by mouth 3 (three) times daily.      CINNAMON PO Take by mouth.     No facility-administered medications prior to visit.     PAST MEDICAL HISTORY: Past Medical History:  Diagnosis Date   Anxiety    Elevated liver enzymes    Frequent headaches    Migraine    2x/6 mos   Pneumonia 06/30/2016   has finished antibiotics.  still has lingering nighttime cough   Shoulder pain     PAST SURGICAL HISTORY: Past Surgical History:  Procedure Laterality Date   CESAREAN SECTION     CESAREAN SECTION     CHOLECYSTECTOMY  2003   COLONOSCOPY WITH PROPOFOL N/A 04/17/2015   Procedure: COLONOSCOPY WITH PROPOFOL;  Surgeon: Josefine Class, MD;  Location: [facility] ENDOSCOPY;  Service: Endoscopy;  Laterality: N/A;   DIAGNOSTIC LAPAROSCOPY WITH REMOVAL OF ECTOPIC PREGNANCY     DILATION AND CURETTAGE OF UTERUS     DUPUYTREN CONTRACTURE RELEASE     ECTOPIC PREGNANCY SURGERY     ESOPHAGOGASTRODUODENOSCOPY (EGD) WITH PROPOFOL N/A 07/22/2016   Procedure: ESOPHAGOGASTRODUODENOSCOPY (EGD) WITH PROPOFOL;  Surgeon: Lucilla Lame, MD;  Location: [facility];  Service: Endoscopy;  Laterality: N/A;   GALLBLADDER SURGERY  2002   HEMORRHOID SURGERY     HEMORRHOID SURGERY N/A 06/05/2015   Procedure: HEMORRHOIDECTOMY;  Surgeon: Leonie Green, MD;  Location: [facility] ORS;  Service: General;  Laterality: N/A;   INTRAUTERINE DEVICE (IUD) INSERTION     LASIK     TUBAL LIGATION      FAMILY HISTORY: Family  History  Problem Relation Age of Onset   Stroke Mother    Depression Mother    Heart disease Father    Arthritis Paternal Grandmother    Cancer Paternal Grandmother        lung   Heart disease Paternal Grandmother    Stroke Paternal Grandmother    Hypertension Paternal Grandmother     SOCIAL HISTORY: Social History   Socioeconomic History   Marital status: Married    Spouse name: Not on file   Number of children: Not on file   Years of education: Not on file   Highest education level: Not on file  Occupational  History   Not on file  Social Needs   Financial resource strain: Not on file   Food insecurity    Worry: Not on file    Inability: Not on file   Transportation needs    Medical: Not on file    Non-medical: Not on file  Tobacco Use   Smoking status: Former Smoker    Quit date: 07/01/2011    Years since quitting: 8.2   Smokeless tobacco: Never Used  Substance and Sexual Activity   Alcohol use: No    Alcohol/week: 0.0 standard drinks   Drug use: No   Sexual activity: Yes    Birth control/protection: I.U.D.  Lifestyle   Physical activity    Days per week: Not on file    Minutes per session: Not on file   Stress: Not on file  Relationships   Social connections    Talks on phone: Not on file    Gets together: Not on file    Attends religious service: Not on file    Active member of club or organization: Not on file    Attends meetings of clubs or organizations: Not on file    Relationship status: Not on file   Intimate partner violence    Fear of current or ex partner: Not on file    Emotionally abused: Not on file    Physically abused: Not on file    Forced sexual activity: Not on file  Other Topics Concern   Not on file  Social History Narrative   Married.   6 children.   Works as a Agricultural engineer.         PHYSICAL EXAM  Vitals:   09/18/19 1527  BP: 109/77  Pulse: 85  Temp: 97.7 F (36.5 C)  TempSrc: Oral  Weight:  189 lb 6.4 oz (85.9 kg)  Height: 5\' 5"  (1.651 m)   Body mass index is 31.52 kg/m.  Generalized: Well developed, in no acute distress  Cardiology: normal rate and rhythm, no murmur noted Respiratory: Clear to auscultation bilaterally Neurological examination  Mentation: Alert oriented to time, place, history taking. Follows all commands speech and language fluent Cranial nerve II-XII: Pupils were equal round reactive to light. Extraocular movements were full, visual field were full on confrontational test.  Motor: The motor testing reveals 5 over 5 strength of all 4 extremities. Good symmetric motor tone is noted throughout.   Gait and station: Gait is normal.   DIAGNOSTIC DATA (LABS, IMAGING, TESTING) - I reviewed patient records, labs, notes, testing and imaging myself where available.  No flowsheet data found.   Lab Results  Component Value Date   WBC 8.2 01/24/2018   HGB 12.9 01/24/2018   HCT 38.7 01/24/2018   MCV 83.8 01/24/2018   PLT 327 01/24/2018      Component Value Date/Time   NA 138 01/24/2018 1543   NA 137 05/26/2014 1855   K 3.7 01/24/2018 1543   K 3.3 (L) 05/26/2014 1855   CL 105 01/24/2018 1543   CL 105 05/26/2014 1855   CO2 25 01/24/2018 1543   CO2 20 (L) 05/26/2014 1855   GLUCOSE 92 01/24/2018 1543   GLUCOSE 131 (H) 05/26/2014 1855   BUN 9 01/24/2018 1543   BUN 5 (L) 05/26/2014 1855   CREATININE 0.82 01/24/2018 1543   CREATININE 0.61 05/26/2014 1855   CALCIUM 9.2 01/24/2018 1543   CALCIUM 8.4 (L) 05/26/2014 1855   PROT 7.7 01/27/2017 0844   PROT 7.5 12/02/2013 1017  ALBUMIN 4.2 01/27/2017 0844   ALBUMIN 3.4 12/02/2013 1017   AST 17 01/27/2017 0844   AST 21 12/02/2013 1017   ALT 25 01/27/2017 0844   ALT 49 12/02/2013 1017   ALKPHOS 118 (H) 01/27/2017 0844   ALKPHOS 90 12/02/2013 1017   BILITOT 0.5 01/27/2017 0844   BILITOT 0.3 12/02/2013 1017   GFRNONAA >60 01/24/2018 1543   GFRNONAA >60 05/26/2014 1855   GFRAA >60 01/24/2018 1543   GFRAA  >60 05/26/2014 1855   Lab Results  Component Value Date   CHOL 148 10/16/2015   HDL 44.70 10/16/2015   LDLCALC 90 10/16/2015   TRIG 67.0 10/16/2015   CHOLHDL 3 10/16/2015   Lab Results  Component Value Date   HGBA1C 5.5 08/12/2016   No results found for: [id number] Lab Results  Component Value Date   TSH 1.50 01/27/2017       ASSESSMENT AND PLAN 38 y.o. year old female  has a past medical history of Anxiety, Elevated liver enzymes, Frequent headaches, Migraine, Pneumonia (06/30/2016), and Shoulder pain. here with     ICD-10-CM   1. OSA on CPAP  G47.33    Z99.89   2. Chronic cluster headache, not intractable  G44.029     Roshawna is doing very well on CPAP therapy.  Compliance report reveals excellent compliance.  We have discussed the need to continue nightly use and greater than 4 hours each night.  She has plans to move her triplets out of her bed which will help sleep habits.  We have also discussed aches.  She is currently managed by PCP.  She is uncertain of medications tried in the past for imaging.  I have suggested she discuss trying Emgality for cluster headaches with her PCP.  We are happy to assist in the care if PCP wishes, however, Emgality is fairly safe and well-tolerated.  Adequate hydration, well-balanced diet and exercise will also help.  Sleep hygiene advised.  She will follow-up with me in 6 months, sooner if needed.  She verbalizes understanding and agreement with this plan.   No orders of the defined types were placed in this encounter.    No orders of the defined types were placed in this encounter.     I spent 15 minutes with the patient. 50% of this time was spent counseling and educating patient on plan of care and medications.    Debbora Presto, FNP-C 09/18/2019, 4:03 PM [facility] [address], [place], [place] [address] [phone]

## 2019-09-18 ENCOUNTER — Encounter: Payer: Self-pay | Admitting: Family Medicine

## 2019-09-18 ENCOUNTER — Ambulatory Visit (INDEPENDENT_AMBULATORY_CARE_PROVIDER_SITE_OTHER): Payer: 59 | Admitting: Family Medicine

## 2019-09-18 VITALS — BP 109/77 | HR 85 | Temp 97.7°F | Ht 65.0 in | Wt 189.4 lb

## 2019-09-18 DIAGNOSIS — G4733 Obstructive sleep apnea (adult) (pediatric): Secondary | ICD-10-CM | POA: Diagnosis not present

## 2019-09-18 DIAGNOSIS — Z9989 Dependence on other enabling machines and devices: Secondary | ICD-10-CM | POA: Diagnosis not present

## 2019-09-18 DIAGNOSIS — G44029 Chronic cluster headache, not intractable: Secondary | ICD-10-CM | POA: Diagnosis not present

## 2020-02-12 ENCOUNTER — Ambulatory Visit (INDEPENDENT_AMBULATORY_CARE_PROVIDER_SITE_OTHER): Payer: 59 | Admitting: Dermatology

## 2020-02-12 ENCOUNTER — Other Ambulatory Visit: Payer: Self-pay

## 2020-02-12 DIAGNOSIS — R21 Rash and other nonspecific skin eruption: Secondary | ICD-10-CM | POA: Diagnosis not present

## 2020-02-12 DIAGNOSIS — L700 Acne vulgaris: Secondary | ICD-10-CM

## 2020-02-12 DIAGNOSIS — L719 Rosacea, unspecified: Secondary | ICD-10-CM

## 2020-02-12 DIAGNOSIS — L853 Xerosis cutis: Secondary | ICD-10-CM

## 2020-02-12 DIAGNOSIS — K130 Diseases of lips: Secondary | ICD-10-CM | POA: Diagnosis not present

## 2020-02-12 MED ORDER — ISOTRETINOIN 30 MG PO CAPS: 30.0000 mg | ORAL_CAPSULE | Freq: Every day | ORAL | 0 refills | Status: DC

## 2020-02-12 NOTE — Progress Notes (Signed)
   Follow-Up Visit   Subjective  Martha Page is a 39 y.o. female who presents for the following: Acne (Week #4 (2nd course) - Isotretinoin 20mg  qd. No mood changes, no GI upset, no muscle or joint aches. Some breakouts on back.) and Other (Spot of right temple). She is tolerating isotretinoin well and has no significant side effects.  There is some improvement since last seen.   The following portions of the chart were reviewed this encounter and updated as appropriate:     Review of Systems: No other skin or systemic complaints.  Objective  Well appearing patient in no apparent distress; mood and affect are within normal limits.  A focused examination was performed including face, back. Relevant physical exam findings are noted in the Assessment and Plan.  Objective  Head - Anterior (Face): Scattered crusts of back.  Objective  Lips: Dryness.  Objective  Right Temporal Scalp: 0.5 cm hypopigmented macule.  Images      Objective  Face: Erythema.  Objective  Hands: Xerosis  Assessment & Plan  Acne vulgaris-severe on isotretinoin  head - Anterior (Face)  Urine pregnancy test performed in office today and was negative.  Patient demonstrates comprehension and confirms she will not get pregnant.    While taking Isotretinoin and for 30 days after you finish the medication, do not get pregnant, do not share pills, do not donate blood. Isotretinoin is best absorbed when taken with a fatty meal. Isotretinoin can make you sensitive to the sun. Daily careful sun protection including sunscreen SPF 30+ when outdoors is recommended.   ISOtretinoin (ACCUTANE) 30 MG capsule - Head - Anterior (Face)  Cheilitis Lips  Continue Nivea lip balm several times daily. Recommend Dr Luvenia Heller lip balm prn.   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 Benign, observe.  Photo today.  Rosacea Face Isotretinoin treatment  Xerosis  cutis Hands  Continue Creave cream several times daily.  Return in about 1 month (around 03/13/2020).    I, Ashok Cordia, CMA, am acting as scribe for Sarina Ser, MD .

## 2020-02-13 ENCOUNTER — Encounter: Payer: Self-pay | Admitting: Dermatology

## 2020-02-18 ENCOUNTER — Telehealth: Payer: Self-pay

## 2020-02-18 ENCOUNTER — Encounter: Payer: Self-pay | Admitting: Dermatology

## 2020-02-18 MED ORDER — PREDNISONE 10 MG PO TABS: 10.0000 mg | ORAL_TABLET | ORAL | 0 refills | Status: DC

## 2020-02-18 NOTE — Telephone Encounter (Signed)
Spoke with patient regarding her MyChart message about mouth ulcers.   She has had mouth ulcers in the past due to acidic food which she has stopped eating. The most recent flare started after she started the increased dose of isotretinoin.  Recommended Oragel to help with the pain.  Sent in Prednisone 10 mg 2 po qd x 3 days then 1 po qd x 4 days then 1 po qod x 2 doses. #12 0RF to [facility] in [place]. Advised of risk of water weight gain, increased appetite and moodiness.

## 2020-03-18 ENCOUNTER — Ambulatory Visit: Payer: 59 | Admitting: Family Medicine

## 2020-03-18 ENCOUNTER — Ambulatory Visit (INDEPENDENT_AMBULATORY_CARE_PROVIDER_SITE_OTHER): Payer: 59 | Admitting: Dermatology

## 2020-03-18 ENCOUNTER — Other Ambulatory Visit: Payer: Self-pay

## 2020-03-18 DIAGNOSIS — K130 Diseases of lips: Secondary | ICD-10-CM | POA: Diagnosis not present

## 2020-03-18 DIAGNOSIS — Z79899 Other long term (current) drug therapy: Secondary | ICD-10-CM | POA: Diagnosis not present

## 2020-03-18 DIAGNOSIS — L709 Acne, unspecified: Secondary | ICD-10-CM

## 2020-03-18 DIAGNOSIS — L853 Xerosis cutis: Secondary | ICD-10-CM

## 2020-03-18 MED ORDER — ISOTRETINOIN 40 MG PO CAPS: 40.0000 mg | ORAL_CAPSULE | Freq: Every day | ORAL | 0 refills | Status: AC

## 2020-03-18 NOTE — Progress Notes (Signed)
   Isotretinoin Follow-Up Visit   Subjective  Martha Page is a 39 y.o. female who presents for the following: Acne (taking Accutane).  Week # 8, taking 30 mg isotretinoin, no breakout   Side effects: Dry skin, dry lips  Denies changes in night vision, shortness of breath, abdominal pain, nausea, vomiting, diarrhea, blood in stool or urine, visual changes, headaches, epistaxis, joint pain, myalgias, mood changes, depression, or suicidal ideation.   Patient is not pregnant, not seeking pregnancy, and not breastfeeding.   The following portions of the chart were reviewed this encounter and updated as appropriate: medications, allergies, medical history  Review of Systems:  No other skin or systemic complaints except as noted in HPI or Assessment and Plan.  Objective  Well appearing patient in no apparent distress; mood and affect are within normal limits.  An examination of the face, neck, chest, and back was performed and relevant findings are noted below.   Objective  Face, chest and back: Clear face, back and chest   Assessment & Plan   Acne, unspecified acne type Face, chest and back  Increase to 40mg  isotretinoin  #30 ORF  ISOtretinoin (ACCUTANE) 40 MG capsule - Face, chest and back   Isotretinoin F/U - 03/18/20 1600      Isotretinoin Follow Up   iPledge #  [id number]    Date  03/18/20    Two Forms of Birth Control  Tubal Sterilization;IUD    Acne breakouts since last visit?  No      Side Effects   Skin  Chapped Lips;Dry Skin    Gastrointestinal  WNL    Neurological  WNL    Constitutional  WNL      Urine pregnancy test performed in office today and was negative.  Patient demonstrates comprehension and confirms she will not get pregnant.   Patient confirmed in iPledge and isotretinoin sent to pharmacy.   While taking Isotretinoin and for 30 days after you finish the medication, do not get pregnant, do not share pills, do not donate blood. Isotretinoin  is best absorbed when taken with a fatty meal. Isotretinoin can make you sensitive to the sun. Daily careful sun protection including sunscreen SPF 30+ when outdoors is recommended.  Xerosis - Continue emollients as directed  Cheilitis - Continue lip balm as directed, Dr. Luvenia Heller Cortibalm recommended  Follow-up in 30 days.  Marene Lenz, CMA, am acting as scribe for Sarina Ser, MD  Documentation: I have reviewed the above documentation for accuracy and completeness, and I agree with the above.  Sarina Ser, MD

## 2020-03-18 NOTE — Patient Instructions (Signed)
While taking Isotretinoin and for 30 days after you finish the medication, do not get pregnant, do not share pills, do not donate blood. Isotretinoin is best absorbed when taken with a fatty meal. Isotretinoin can make you sensitive to the sun. Daily careful sun protection including sunscreen SPF 30+ when outdoors is recommended.  

## 2020-03-25 ENCOUNTER — Encounter: Payer: Self-pay | Admitting: Dermatology

## 2020-04-22 ENCOUNTER — Ambulatory Visit (INDEPENDENT_AMBULATORY_CARE_PROVIDER_SITE_OTHER): Payer: 59 | Admitting: Dermatology

## 2020-04-22 ENCOUNTER — Other Ambulatory Visit: Payer: Self-pay

## 2020-04-22 DIAGNOSIS — L853 Xerosis cutis: Secondary | ICD-10-CM

## 2020-04-22 DIAGNOSIS — Z79899 Other long term (current) drug therapy: Secondary | ICD-10-CM | POA: Diagnosis not present

## 2020-04-22 DIAGNOSIS — K130 Diseases of lips: Secondary | ICD-10-CM

## 2020-04-22 DIAGNOSIS — L700 Acne vulgaris: Secondary | ICD-10-CM | POA: Diagnosis not present

## 2020-04-22 MED ORDER — ISOTRETINOIN 30 MG PO CAPS: 60.0000 mg | ORAL_CAPSULE | Freq: Every day | ORAL | 0 refills | Status: DC

## 2020-04-22 NOTE — Progress Notes (Signed)
   Isotretinoin Follow-Up Visit   Subjective  Martha Page is a 39 y.o. female who presents for the following: Acne (Week #12 Isotretinoin 40mg  qd).  Week # 12   Isotretinoin F/U - 04/22/20 1600      Isotretinoin Follow Up   iPledge # [id number]    Date 04/22/20    Two Forms of Birth Control IUD;Tubal Sterilization    Acne breakouts since last visit? Yes      Dosage   Current (To Date) Dosage (mg) 40      Side Effects   Skin Chapped Lips;Dry Skin    Gastrointestinal WNL    Neurological WNL    Constitutional WNL           Side effects: Dry skin, dry lips  Denies changes in night vision, shortness of breath, abdominal pain, nausea, vomiting, diarrhea, blood in stool or urine, visual changes, headaches, epistaxis, joint pain, myalgias, mood changes, depression, or suicidal ideation.   Patient is not pregnant, not seeking pregnancy, and not breastfeeding.   The following portions of the chart were reviewed this encounter and updated as appropriate: medications, allergies, medical history  Review of Systems:  No other skin or systemic complaints except as noted in HPI or Assessment and Plan.  Objective  Well appearing patient in no apparent distress; mood and affect are within normal limits.  An examination of the face, neck, chest, and back was performed and relevant findings are noted below.   Objective  face: Pink papules and macules mostly of mandible   Assessment & Plan   Acne vulgaris, Severe - improving on Isotretinoin wk #12 face  Urine pregnancy test performed in office today and was negative.  Patient demonstrates comprehension and confirms she will not get pregnant.  Lot #[id number] Exp 09/06/2021  ISOtretinoin (ACCUTANE) 30 MG capsule - face Increase to 30 mg 2 po qd (60 mg total dose) #60 0 rf  While taking Isotretinoin and for 30 days after you finish the medication, do not get pregnant, do not share pills, do not donate blood. Isotretinoin is  best absorbed when taken with a fatty meal. Isotretinoin can make you sensitive to the sun. Daily careful sun protection including sunscreen SPF 30+ when outdoors is recommended.  Xerosis - Continue emollients as directed  Cheilitis - Continue lip balm as directed, Dr. Luvenia Heller Cortibalm recommended  Follow-up in 30 days.  I, Ashok Cordia, CMA, am acting as scribe for Sarina Ser, MD . Documentation: I have reviewed the above documentation for accuracy and completeness, and I agree with the above.  Sarina Ser, MD

## 2020-04-22 NOTE — Patient Instructions (Addendum)
While taking Isotretinoin and for 30 days after you finish the medication, do not get pregnant, do not share pills, do not donate blood. Isotretinoin is best absorbed when taken with a fatty meal. Isotretinoin can make you sensitive to the sun. Daily careful sun protection including sunscreen SPF 30+ when outdoors is recommended.  

## 2020-04-23 ENCOUNTER — Encounter: Payer: Self-pay | Admitting: Dermatology

## 2020-05-27 ENCOUNTER — Other Ambulatory Visit: Payer: Self-pay

## 2020-05-27 ENCOUNTER — Ambulatory Visit (INDEPENDENT_AMBULATORY_CARE_PROVIDER_SITE_OTHER): Payer: 59 | Admitting: Dermatology

## 2020-05-27 DIAGNOSIS — L700 Acne vulgaris: Secondary | ICD-10-CM | POA: Diagnosis not present

## 2020-05-27 DIAGNOSIS — L719 Rosacea, unspecified: Secondary | ICD-10-CM | POA: Diagnosis not present

## 2020-05-27 DIAGNOSIS — Z79899 Other long term (current) drug therapy: Secondary | ICD-10-CM

## 2020-05-27 DIAGNOSIS — D2371 Other benign neoplasm of skin of right lower limb, including hip: Secondary | ICD-10-CM | POA: Diagnosis not present

## 2020-05-27 DIAGNOSIS — D485 Neoplasm of uncertain behavior of skin: Secondary | ICD-10-CM

## 2020-05-27 NOTE — Patient Instructions (Addendum)
While taking Isotretinoin and for 30 days after you finish the medication, do not get pregnant, do not share pills, do not donate blood. Isotretinoin is best absorbed when taken with a fatty meal. Isotretinoin can make you sensitive to the sun. Daily careful sun protection including sunscreen SPF 30+ when outdoors is recommended.  Shave Excision Benign Lesion Wound Care Instructions  . Leave the original bandage on for 24 hours if possible.  If the bandage becomes soaked or soiled before that time, it is OK to remove it and examine the wound.  A small amount of post-operative bleeding is normal.  If excessive bleeding occurs, remove the bandage, place gauze over the site and apply continuous pressure (no peeking) over the area for 20-30 minutes.  If this does not stop the bleeding, try again for 40 minutes.  If this does not work, please call our clinic as soon as possible (even if after-hours).    . Twice a day, cleanse the wound with soap and water.  If a thick crust develops you may use a Q-tip dipped into dilute hydrogen peroxide (mix 1:1 with water) to dissolve it.  Hydrogen peroxide can slow the healing process, so use it only as needed.  After washing, apply Vaseline jelly or Polysporin ointment.  For best healing, the wound should be covered with a layer of ointment at all times.  This may mean re-applying the ointment several times a day.  For open wounds, continue until it has healed.    . If you have any swelling, keep the area elevated.  . Some redness, tenderness and white or yellow material in the wound is normal healing.  If the area becomes very sore and red, or develops a thick yellow-green material (pus), it may be infected; please notify us.    . Wound healing continues for up to one year following surgery.  It is not unusual to experience pain in the scar from time to time during the interval.  If the pain becomes severe or the scar thickens, you should notify the office.  A slight  amount of redness in a scar is expected for the first six months.  After six months, the redness subsides and the scar will soften and fade.  The color difference becomes less noticeable with time.  If there are any problems, return for a post-op surgery check at your earliest convenience.  . Please call our office for any questions or concerns.

## 2020-05-27 NOTE — Progress Notes (Signed)
Follow-Up Visit   Subjective  Martha Page is a 39 y.o. female who presents for the following: Acne (Isotretinoin week 16 - 30mg  2 po QD patient c/o dry lips, eyes, skin, and nose, as well as joint pain but that may be related to [place]).  The following portions of the chart were reviewed this encounter and updated as appropriate:  Tobacco  Allergies  Meds  Problems  Med Hx  Surg Hx  Fam Hx     Review of Systems:  No other skin or systemic complaints except as noted in HPI or Assessment and Plan.  Objective  Well appearing patient in no apparent distress; mood and affect are within normal limits.  A focused examination was performed including face, neck, chest and back. Relevant physical exam findings are noted in the Assessment and Plan.  Objective  Face, chest, back: Pinkness on the face otherwise clear.   Objective  R distal ant thigh above the knee: 0.9 cm pink papule   Objective  Face: Pinkness on the face  Assessment & Plan    Acne vulgaris, severe on Isotretinoin Face, chest, back  Week 16 - Much improved since starting Isotretinoin treatment Pending labs continue Isotretinoin 30mg  2 po QD #60 0RF. While taking Isotretinoin and for 30 days after you finish the medication, do not get pregnant, do not share pills, do not donate blood. Isotretinoin is best absorbed when taken with a fatty meal. Isotretinoin can make you sensitive to the sun. Daily careful sun protection including sunscreen SPF 30+ when outdoors is recommended. Continue for at least 2 more months of treatment.  Ipledge #[id number] IUD, Tubial ligation  [facility]   HCG, Qualitative - Face, chest, back  CMP - Face, chest, back  Lipid Panel - Face, chest, back  Other Related Medications ISOtretinoin (ACCUTANE) 30 MG capsule  Neoplasm of uncertain behavior of skin R distal ant thigh above the knee  Skin / nail biopsy Type of biopsy: tangential   Informed consent: discussed and  consent obtained   Timeout: patient name, date of birth, surgical site, and procedure verified   Procedure prep:  Patient was prepped and draped in usual sterile fashion Prep type:  Isopropyl alcohol Anesthesia: the lesion was anesthetized in a standard fashion   Anesthetic:  1% lidocaine w/ epinephrine 1-100,000 buffered w/ 8.4% NaHCO3 Instrument used: flexible razor blade   Hemostasis achieved with: pressure, aluminum chloride and electrodesiccation   Outcome: patient tolerated procedure well   Post-procedure details: sterile dressing applied and wound care instructions given   Dressing type: bandage and petrolatum    Specimen 1 - Surgical pathology Differential Diagnosis: D48.5 DF vs BCC vs other  Check Margins: No 0.9 cm pink papule  Rosacea Face  Discussed treating after patient has been off of Isotretinoin for 6 months.    Isotretinoin F/U - 05/27/20 1600      Isotretinoin Follow Up   iPledge # [id number]    Date 05/27/20    Two Forms of Birth Control IUD;Tubal Sterilization    Acne breakouts since last visit? No      Side Effects   Skin Chapped Lips;Dry Eyes;Dry Lips;Dry Nose;Dry Skin;Eye Irritation    Gastrointestinal WNL    Neurological WNL    Constitutional Muscle/joint aches   Patient unsure if joint aches related to med or possible FMS         Return in about 1 month (around 06/27/2020) for Isotretinoin follow up .  Luther Redo, CMA,  am acting as scribe for Sarina Ser, MD .  Documentation: I have reviewed the above documentation for accuracy and completeness, and I agree with the above.  Sarina Ser, MD

## 2020-05-30 ENCOUNTER — Encounter: Payer: Self-pay | Admitting: Dermatology

## 2020-06-01 ENCOUNTER — Ambulatory Visit: Payer: Self-pay | Admitting: Family Medicine

## 2020-06-01 ENCOUNTER — Encounter: Payer: Self-pay | Admitting: Family Medicine

## 2020-06-01 ENCOUNTER — Telehealth: Payer: Self-pay

## 2020-06-01 MED ORDER — ISOTRETINOIN 30 MG PO CAPS: 30.0000 mg | ORAL_CAPSULE | Freq: Two times a day (BID) | ORAL | 0 refills | Status: DC

## 2020-06-01 NOTE — Telephone Encounter (Signed)
Patients labs were faxed in from [facility]. You will find accutane labs under her chart review then the media tab.

## 2020-06-01 NOTE — Telephone Encounter (Signed)
Patient advised labs okay and prescription sent in.   Per Dr. Nehemiah Massed:  Lab OK - mildly elevated Alk Phos and Lipid.  Pregnancy negative   Continue Isotretinoin plan.   Call pt and send Isotretinoin in.

## 2020-06-03 ENCOUNTER — Telehealth: Payer: Self-pay

## 2020-06-03 NOTE — Telephone Encounter (Signed)
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 Recheck in future

## 2020-06-03 NOTE — Telephone Encounter (Signed)
Patient advised biopsy results benign, JS

## 2020-06-10 ENCOUNTER — Ambulatory Visit (INDEPENDENT_AMBULATORY_CARE_PROVIDER_SITE_OTHER): Payer: 59 | Admitting: Family Medicine

## 2020-06-10 ENCOUNTER — Encounter: Payer: Self-pay | Admitting: Family Medicine

## 2020-06-10 VITALS — BP 121/84 | HR 91 | Ht 65.0 in | Wt 208.0 lb

## 2020-06-10 DIAGNOSIS — G4733 Obstructive sleep apnea (adult) (pediatric): Secondary | ICD-10-CM | POA: Diagnosis not present

## 2020-06-10 DIAGNOSIS — Z9989 Dependence on other enabling machines and devices: Secondary | ICD-10-CM

## 2020-06-10 NOTE — Progress Notes (Signed)
PATIENT: Martha Page DOB: 29-Aug-1981  REASON FOR VISIT: follow up HISTORY FROM: patient  Chief Complaint  Patient presents with  . Follow-up     HISTORY OF PRESENT ILLNESS: Today 06/10/20 Martha Page is a 39 y.o. female here today for follow up of OSA on CPAP.  She is doing well on CPAP therapy.  She admits that there are nights where she does not reach her for our goal.  She continues to work closely with her primary care provider for management of headaches and concerns of fibromyalgia.  Otherwise she is doing well.  Compliance report dated 05/11/2020 through 06/09/2020 reveals that she used CPAP 25 of the past 30 days for compliance of 83%.  She used CPAP greater than 4 hours 20 one of the past 30 days for compliance of 70%.  Average usage on days used was 6 hours and 35 minutes.  Residual AHI was 1.0 on 8 to 16 cm of water and an EPR of 2.  There was no significant leak noted.    HISTORY: (copied from my note on 09/18/2019)  Martha Page is a 39 y.o. female here today for follow up of OSA on CPAP. She feels that she may have less headaches than she did. She continues to have regular headaches managed by PCP. She has cluster and migrainous symptoms. She uses ibuprofen and sumatriptan as needed for abortive therapy.  She does have a history of seasonal allergies.  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  They are sleeping with her at night.  Reports she feels better rested in the mornings. She has adjusted well to CPAP therapy.  Compliance report dated 08/18/2019 through 09/16/2019 reveals that she is using CPAP 30 of the last 30 days for compliance of 100%. 24 days she used CPAP greater than 4 hours for compliance of 80%.  Average usage was 6 hours and 3 minutes.  AHI was 0.5 on 8 to 16 cm of water and EPR of 2.  There was no significant leak noted.   HISTORY: (copied from Dr Dohmeier's note on 02/28/2019)  Martha Page a 39 y.o.female, seen here in a  referral by NPLamfor a sleep evaluation.  Chief complaint according to patient :1)She has been told that she snores. She is very sleepy in daytime and this has increased over 6-7 years. Excessive daytime sleepiness has become a problem for productivity, safety.  2)She wakes up with morning headaches which are dull she also has a second type of headache that is very much suspect of being a cluster headache. These wake her up out of sleep are shooting pains that are sharp and topical. The pain is much more severe. The morning headaches may wax and wane through the day.  For the cluster headaches she has to take sumatriptan or ibuprofen. She denies any cataplectic events, sleep paralysis but she has vivid dreams throughout the night and her dreams can continue after she went to the bathroom for example.   Sleepandmedical history: increasing trend of snoring louder. Excessive daytime sleepiness has become a problem for productivity, safety. may be ongoing for 3-5 years, may be longer. She has wondered if she may have Narcolepsy versus "just" hypersomnia. She denies any cataplectic events, sleep paralysis but she has vivid dreams throughout the night and her dreams can continue after she went to the bathroom for example. She is taken buspirone not for anxiety, but for headaches. No longer Gabapentin or Mirabegron.  Familymedical/sleep history:excessive daytime sleepiness is not present in other family members.  Social history:Martha Page is married, and a busy mother of 32. 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 She has never used tobacco products.   REVIEW OF SYSTEMS: Out of a complete 14 system review of symptoms, the patient complains only of the following symptoms, fatigue and all other reviewed systems are negative.  ALLERGIES: Allergies  Allergen Reactions  . Cefazolin Rash and Shortness Of Breath  . Codeine Sulfate Itching  .  Doxycycline Other (See Comments)    "stomach upset" per pt records  . Red Dye Hives  . Sulfa Antibiotics Other (See Comments)    "Stomach pain"  . Amoxicillin Rash  . Penicillin V Potassium Rash    HOME MEDICATIONS: Outpatient Medications Prior to Visit  Medication Sig Dispense Refill  . cetirizine (ZYRTEC) 10 MG tablet TAKE 1 TABLET BY MOUTH EVERY DAY 90 tablet 0  . DULoxetine (CYMBALTA) 60 MG capsule Take by mouth daily.    Marland Kitchen EPINEPHrine 0.3 mg/0.3 mL IJ SOAJ injection Inject 0.3 mg into the muscle once.     . ISOtretinoin (ACCUTANE) 30 MG capsule Take 1 capsule (30 mg total) by mouth 2 (two) times daily. 60 capsule 0  . MIRENA, 52 MG, 20 MCG/24HR IUD     . pantoprazole (PROTONIX) 40 MG tablet Take 1 tablet (40 mg total) by mouth daily. 60 tablet 6  . BIOTIN PO Take by mouth. (Patient not taking: Reported on 05/27/2020)    . buPROPion (WELLBUTRIN XL) 150 MG 24 hr tablet Take 150 mg by mouth daily. (Patient not taking: Reported on 05/27/2020)    . ISOtretinoin (ACCUTANE) 30 MG capsule Take 2 capsules (60 mg total) by mouth daily. 60 capsule 0  . Multiple Vitamin (MULTIVITAMIN) tablet Take 1 tablet by mouth daily. (Patient not taking: Reported on 05/27/2020)    . predniSONE (DELTASONE) 10 MG tablet Take 1 tablet (10 mg total) by mouth See admin instructions. Take 2 po qd x 3 days then 1 po qd x 4 days then 1 po qod x 2 doses. (Patient not taking: Reported on 05/27/2020) 12 tablet 0  . senna (SENOKOT) 8.6 MG tablet Take by mouth.     No facility-administered medications prior to visit.    PAST MEDICAL HISTORY: Past Medical History:  Diagnosis Date  . Acne    iPledge: [id number]  . Anxiety   . Dermatitis    eczema  . Elevated liver enzymes   . Frequent headaches   . Hx of dysplastic nevus 04/02/2013   Left distal knee. Mild atypia, margins free  . Migraine    2x/6 mos  . Pneumonia 06/30/2016   has finished antibiotics.  still has lingering nighttime cough  . Shoulder pain      PAST SURGICAL HISTORY: Past Surgical History:  Procedure Laterality Date  . CESAREAN SECTION    . CESAREAN SECTION    . CHOLECYSTECTOMY  2003  . COLONOSCOPY WITH PROPOFOL N/A 04/17/2015   Procedure: COLONOSCOPY WITH PROPOFOL;  Surgeon: Josefine Class, MD;  Location: [facility] ENDOSCOPY;  Service: Endoscopy;  Laterality: N/A;  . DIAGNOSTIC LAPAROSCOPY WITH REMOVAL OF ECTOPIC PREGNANCY    . DILATION AND CURETTAGE OF UTERUS    . DUPUYTREN CONTRACTURE RELEASE    . ECTOPIC PREGNANCY SURGERY    . ESOPHAGOGASTRODUODENOSCOPY (EGD) WITH PROPOFOL N/A 07/22/2016   Procedure: ESOPHAGOGASTRODUODENOSCOPY (EGD) WITH PROPOFOL;  Surgeon: Lucilla Lame, MD;  Location: [facility];  Service: Endoscopy;  Laterality: N/A;  . GALLBLADDER SURGERY  2002  . HEMORRHOID SURGERY    . HEMORRHOID SURGERY N/A 06/05/2015   Procedure: HEMORRHOIDECTOMY;  Surgeon: Leonie Green, MD;  Location: [facility] ORS;  Service: General;  Laterality: N/A;  . INTRAUTERINE DEVICE (IUD) INSERTION    . LASIK    . TUBAL LIGATION      FAMILY HISTORY: Family History  Problem Relation Age of Onset  . Stroke Mother   . Depression Mother   . Heart disease Father   . Arthritis Paternal Grandmother   . Cancer Paternal Grandmother        lung  . Heart disease Paternal Grandmother   . Stroke Paternal Grandmother   . Hypertension Paternal Grandmother     SOCIAL HISTORY: Social History   Socioeconomic History  . Marital status: Married    Spouse name: Not on file  . Number of children: Not on file  . Years of education: Not on file  . Highest education level: Not on file  Occupational History  . Not on file  Tobacco Use  . Smoking status: Former Smoker    Quit date: 07/01/2011    Years since quitting: 8.9  . Smokeless tobacco: Never Used  Vaping Use  . Vaping Use: Never used  Substance and Sexual Activity  . Alcohol use: No    Alcohol/week: 0.0 standard drinks  . Drug use: No  . Sexual activity: Yes    Birth  control/protection: I.U.D.  Other Topics Concern  . Not on file  Social History Narrative   Married.   6 children.   Works as a Agricultural engineer.      Social Determinants of Health   Financial Resource Strain:   . Difficulty of Paying Living Expenses:   Food Insecurity:   . Worried About Charity fundraiser in the Last Year:   . Arboriculturist in the Last Year:   Transportation Needs:   . Film/video editor (Medical):   Marland Kitchen Lack of Transportation (Non-Medical):   Physical Activity:   . Days of Exercise per Week:   . Minutes of Exercise per Session:   Stress:   . Feeling of Stress :   Social Connections:   . Frequency of Communication with Friends and Family:   . Frequency of Social Gatherings with Friends and Family:   . Attends Religious Services:   . Active Member of Clubs or Organizations:   . Attends Archivist Meetings:   Marland Kitchen Marital Status:   Intimate Partner Violence:   . Fear of Current or Ex-Partner:   . Emotionally Abused:   Marland Kitchen Physically Abused:   . Sexually Abused:       PHYSICAL EXAM  Vitals:   06/10/20 1124  BP: 121/84  Pulse: 91  Weight: 208 lb (94.3 kg)  Height: 5\' 5"  (1.651 m)   Body mass index is 34.61 kg/m.  Generalized: Well developed, in no acute distress  Cardiology: normal rate and rhythm, no murmur noted Respiratory: clear to auscultation bilaterally  Neurological examination  Mentation: Alert oriented to time, place, history taking. Follows all commands speech and language fluent Cranial nerve II-XII: Pupils were equal round reactive to light. Extraocular movements were full, visual field were full Motor: The motor testing reveals 5 over 5 strength of all 4 extremities. Good symmetric motor tone is noted throughout.  Gait and station: Gait is normal.   DIAGNOSTIC DATA (LABS, IMAGING, TESTING) - I reviewed  patient records, labs, notes, testing and imaging myself where available.  No flowsheet data found.   Lab Results    Component Value Date   WBC 8.2 01/24/2018   HGB 12.9 01/24/2018   HCT 38.7 01/24/2018   MCV 83.8 01/24/2018   PLT 327 01/24/2018      Component Value Date/Time   NA 138 01/24/2018 1543   NA 137 05/26/2014 1855   K 3.7 01/24/2018 1543   K 3.3 (L) 05/26/2014 1855   CL 105 01/24/2018 1543   CL 105 05/26/2014 1855   CO2 25 01/24/2018 1543   CO2 20 (L) 05/26/2014 1855   GLUCOSE 92 01/24/2018 1543   GLUCOSE 131 (H) 05/26/2014 1855   BUN 9 01/24/2018 1543   BUN 5 (L) 05/26/2014 1855   CREATININE 0.82 01/24/2018 1543   CREATININE 0.61 05/26/2014 1855   CALCIUM 9.2 01/24/2018 1543   CALCIUM 8.4 (L) 05/26/2014 1855   PROT 7.7 01/27/2017 0844   PROT 7.5 12/02/2013 1017   ALBUMIN 4.2 01/27/2017 0844   ALBUMIN 3.4 12/02/2013 1017   AST 17 01/27/2017 0844   AST 21 12/02/2013 1017   ALT 25 01/27/2017 0844   ALT 49 12/02/2013 1017   ALKPHOS 118 (H) 01/27/2017 0844   ALKPHOS 90 12/02/2013 1017   BILITOT 0.5 01/27/2017 0844   BILITOT 0.3 12/02/2013 1017   GFRNONAA >60 01/24/2018 1543   GFRNONAA >60 05/26/2014 1855   GFRAA >60 01/24/2018 1543   GFRAA >60 05/26/2014 1855   Lab Results  Component Value Date   CHOL 148 10/16/2015   HDL 44.70 10/16/2015   LDLCALC 90 10/16/2015   TRIG 67.0 10/16/2015   CHOLHDL 3 10/16/2015   Lab Results  Component Value Date   HGBA1C 5.5 08/12/2016   No results found for: [id number] Lab Results  Component Value Date   TSH 1.50 01/27/2017       ASSESSMENT AND PLAN 39 y.o. year old female  has a past medical history of Acne, Anxiety, Dermatitis, Elevated liver enzymes, Frequent headaches, dysplastic nevus (04/02/2013), Migraine, Pneumonia (06/30/2016), and Shoulder pain. here with     ICD-10-CM   1. OSA on CPAP  G47.33    Z99.89     Gustava continues to do well with CPAP therapy.  Compliance report reveals acceptable compliance.  I have encouraged her to continue using CPAP nightly and for greater than 4 hours each night.  She will  continue to work closely with primary care for management of headaches and fibromyalgia.  We have discussed need for healthy lifestyle habits to help with management of fatigue.  She will follow-up with me in 1 year, sooner if needed.  She verbalizes understanding and agreement with this plan.   No orders of the defined types were placed in this encounter.    No orders of the defined types were placed in this encounter.     I spent 15 minutes with the patient. 50% of this time was spent counseling and educating patient on plan of care and medications.    Debbora Presto, FNP-C 06/10/2020, 4:06 PM [facility] [address] [place], [place] [address] [phone]

## 2020-06-10 NOTE — Patient Instructions (Signed)
Please continue using your CPAP regularly. While your insurance requires that you use CPAP at least 4 hours each night on 70% of the nights, I recommend, that you not skip any nights and use it throughout the night if you can. Getting used to CPAP and staying with the treatment long term does take time and patience and discipline. Untreated obstructive sleep apnea when it is moderate to severe can have an adverse impact on cardiovascular health and raise her risk for heart disease, arrhythmias, hypertension, congestive heart failure, stroke and diabetes. Untreated obstructive sleep apnea causes sleep disruption, nonrestorative sleep, and sleep deprivation. This can have an impact on your day to day functioning and cause daytime sleepiness and impairment of cognitive function, memory loss, mood disturbance, and problems focussing. Using CPAP regularly can improve these symptoms.   Follow up in 1 year   Sleep Apnea Sleep apnea affects breathing during sleep. It causes breathing to stop for a short time or to become shallow. It can also increase the risk of:  Heart attack.  Stroke.  Being very overweight (obese).  Diabetes.  Heart failure.  Irregular heartbeat. The goal of treatment is to help you breathe normally again. What are the causes? There are three kinds of sleep apnea:  Obstructive sleep apnea. This is caused by a blocked or collapsed airway.  Central sleep apnea. This happens when the brain does not send the right signals to the muscles that control breathing.  Mixed sleep apnea. This is a combination of obstructive and central sleep apnea. The most common cause of this condition is a collapsed or blocked airway. This can happen if:  Your throat muscles are too relaxed.  Your tongue and tonsils are too large.  You are overweight.  Your airway is too small. What increases the risk?  Being overweight.  Smoking.  Having a small airway.  Being older.  Being  female.  Drinking alcohol.  Taking medicines to calm yourself (sedatives or tranquilizers).  Having family members with the condition. What are the signs or symptoms?  Trouble staying asleep.  Being sleepy or tired during the day.  Getting angry a lot.  Loud snoring.  Headaches in the morning.  Not being able to focus your mind (concentrate).  Forgetting things.  Less interest in sex.  Mood swings.  Personality changes.  Feelings of sadness (depression).  Waking up a lot during the night to pee (urinate).  Dry mouth.  Sore throat. How is this diagnosed?  Your medical history.  A physical exam.  A test that is done when you are sleeping (sleep study). The test is most often done in a sleep lab but may also be done at home. How is this treated?   Sleeping on your side.  Using a medicine to get rid of mucus in your nose (decongestant).  Avoiding the use of alcohol, medicines to help you relax, or certain pain medicines (narcotics).  Losing weight, if needed.  Changing your diet.  Not smoking.  Using a machine to open your airway while you sleep, such as: ? An oral appliance. This is a mouthpiece that shifts your lower jaw forward. ? A CPAP device. This device blows air through a mask when you breathe out (exhale). ? An EPAP device. This has valves that you put in each nostril. ? A BPAP device. This device blows air through a mask when you breathe in (inhale) and breathe out.  Having surgery if other treatments do not work. It is   important to get treatment for sleep apnea. Without treatment, it can lead to:  High blood pressure.  Coronary artery disease.  In men, not being able to have an erection (impotence).  Reduced thinking ability. Follow these instructions at home: Lifestyle  Make changes that your doctor recommends.  Eat a healthy diet.  Lose weight if needed.  Avoid alcohol, medicines to help you relax, and some pain  medicines.  Do not use any products that contain nicotine or tobacco, such as cigarettes, e-cigarettes, and chewing tobacco. If you need help quitting, ask your doctor. General instructions  Take over-the-counter and prescription medicines only as told by your doctor.  If you were given a machine to use while you sleep, use it only as told by your doctor.  If you are having surgery, make sure to tell your doctor you have sleep apnea. You may need to bring your device with you.  Keep all follow-up visits as told by your doctor. This is important. Contact a doctor if:  The machine that you were given to use during sleep bothers you or does not seem to be working.  You do not get better.  You get worse. Get help right away if:  Your chest hurts.  You have trouble breathing in enough air.  You have an uncomfortable feeling in your back, arms, or stomach.  You have trouble talking.  One side of your body feels weak.  A part of your face is hanging down. These symptoms may be an emergency. Do not wait to see if the symptoms will go away. Get medical help right away. Call your local emergency services (911 in the [place] Do not drive yourself to the hospital. Summary  This condition affects breathing during sleep.  The most common cause is a collapsed or blocked airway.  The goal of treatment is to help you breathe normally while you sleep. This information is not intended to replace advice given to you by your health care provider. Make sure you discuss any questions you have with your health care provider. Document Revised: 08/10/2018 Document Reviewed: 06/19/2018 Elsevier Patient Education  2020 [employer] Inc[employer]  

## 2020-06-11 NOTE — Progress Notes (Signed)
Order for cpap supplies sent to [facility] via Fax. Confirmation received that the order transmitted was successful.

## 2020-07-01 ENCOUNTER — Ambulatory Visit (INDEPENDENT_AMBULATORY_CARE_PROVIDER_SITE_OTHER): Payer: 59 | Admitting: Dermatology

## 2020-07-01 ENCOUNTER — Other Ambulatory Visit: Payer: Self-pay

## 2020-07-01 DIAGNOSIS — H01134 Eczematous dermatitis of left upper eyelid: Secondary | ICD-10-CM

## 2020-07-01 DIAGNOSIS — L853 Xerosis cutis: Secondary | ICD-10-CM

## 2020-07-01 DIAGNOSIS — Z79899 Other long term (current) drug therapy: Secondary | ICD-10-CM

## 2020-07-01 DIAGNOSIS — L700 Acne vulgaris: Secondary | ICD-10-CM

## 2020-07-01 DIAGNOSIS — H01131 Eczematous dermatitis of right upper eyelid: Secondary | ICD-10-CM | POA: Diagnosis not present

## 2020-07-01 DIAGNOSIS — K130 Diseases of lips: Secondary | ICD-10-CM

## 2020-07-01 DIAGNOSIS — H01135 Eczematous dermatitis of left lower eyelid: Secondary | ICD-10-CM | POA: Diagnosis not present

## 2020-07-01 DIAGNOSIS — H01132 Eczematous dermatitis of right lower eyelid: Secondary | ICD-10-CM | POA: Diagnosis not present

## 2020-07-01 MED ORDER — TACROLIMUS 0.1 % EX OINT: TOPICAL_OINTMENT | Freq: Two times a day (BID) | CUTANEOUS | 0 refills | Status: DC

## 2020-07-01 MED ORDER — ISOTRETINOIN 30 MG PO CAPS: ORAL_CAPSULE | ORAL | 0 refills | Status: DC

## 2020-07-01 NOTE — Progress Notes (Signed)
   Follow-Up Visit   Subjective  Martha Page is a 39 y.o. female who presents for the following: Acne (Isotretinoin week 20 30mg  2 po QD - see flowsheet for s.e.). Flares may be related to recent stress from finding out father has stage four throat CA and her children returning to school.  The following portions of the chart were reviewed this encounter and updated as appropriate:  Tobacco  Allergies  Meds  Problems  Med Hx  Surg Hx  Fam Hx     Review of Systems:  No other skin or systemic complaints except as noted in HPI or Assessment and Plan.  Objective  Well appearing patient in no apparent distress; mood and affect are within normal limits.  A focused examination was performed including face, neck, chest and back. Relevant physical exam findings are noted in the Assessment and Plan.  Objective  Face: A couple papules on the mandible.   Objective  B/L eyelid: Erythema  Assessment & Plan  Acne vulgaris Face  Isotretinoin week 20 - Improved, continue for at least 1 to 2 more months  In office pregnancy test negative.  Lot # [id number] Expiration date: 11/06/2021  Continue Isotretinoin 30mg  2 po QD. #60 0RF Ipledge #[id number] IUD, Tubial ligation  [facility]   ISOtretinoin (ACCUTANE) 30 MG capsule - Face  Eczematous dermatitis of upper and lower eyelids of both eyes B/L eyelid  Secondary to Isotretinoin treatment -  Start Protopic 0.1% ointment apply to aa's BID PRN flares.  tacrolimus (PROTOPIC) 0.1 % ointment - B/L eyelid  Xerosis Secondary to Isotretinoin Treatment  - diffuse xerotic patches - recommend gentle, hydrating skin care - gentle skin care handout given  Cheilitis Secondary to Isotretinoin Treatment  - Continue lip balm as directed, Dr. Luvenia Heller Cortibalm or Aquaphor recommended   Isotretinoin F/U - 07/01/20 1500      Isotretinoin Follow Up   iPledge # [id number]    Date 07/01/20    Two Forms of Birth Control IUD;Tubal  Sterilization    Acne breakouts since last visit? Yes      Side Effects   Skin Chapped Lips;Dry Eyes;Dry Lips;Dry Nose;Dry Skin    Gastrointestinal WNL    Neurological WNL    Constitutional Fatigue;Muscle/joint aches    Other Side Effects --   patient thinks fatigue and muscle/joint aches maybe FMS          Return in about 1 month (around 08/01/2020).  Luther Redo, CMA, am acting as scribe for Sarina Ser, MD .  Documentation: I have reviewed the above documentation for accuracy and completeness, and I agree with the above.  Sarina Ser, MD

## 2020-07-08 ENCOUNTER — Encounter: Payer: Self-pay | Admitting: Dermatology

## 2020-08-03 ENCOUNTER — Ambulatory Visit (HOSPITAL_COMMUNITY)
Admission: RE | Admit: 2020-08-03 | Discharge: 2020-08-03 | Disposition: A | Discharge status: Home / Self Care | Payer: 59 | Source: Ambulatory Visit | Attending: Pulmonary Disease | Admitting: Pulmonary Disease

## 2020-08-03 ENCOUNTER — Other Ambulatory Visit: Payer: Self-pay | Admitting: Oncology

## 2020-08-03 ENCOUNTER — Encounter: Payer: Self-pay | Admitting: Oncology

## 2020-08-03 ENCOUNTER — Ambulatory Visit: Payer: 59 | Admitting: Dermatology

## 2020-08-03 DIAGNOSIS — U071 COVID-19: Secondary | ICD-10-CM | POA: Diagnosis not present

## 2020-08-03 MED ORDER — DIPHENHYDRAMINE HCL 50 MG/ML IJ SOLN: 50.0000 mg | Freq: Once | INTRAMUSCULAR | Status: DC | PRN

## 2020-08-03 MED ORDER — METHYLPREDNISOLONE SODIUM SUCC 125 MG IJ SOLR: 125.0000 mg | Freq: Once | INTRAMUSCULAR | Status: DC | PRN

## 2020-08-03 MED ORDER — SODIUM CHLORIDE 0.9 % IV SOLN: INTRAVENOUS | Status: DC | PRN

## 2020-08-03 MED ORDER — ALBUTEROL SULFATE HFA 108 (90 BASE) MCG/ACT IN AERS: 2.0000 | INHALATION_SPRAY | Freq: Once | RESPIRATORY_TRACT | Status: DC | PRN

## 2020-08-03 MED ORDER — EPINEPHRINE 0.3 MG/0.3ML IJ SOAJ: 0.3000 mg | Freq: Once | INTRAMUSCULAR | Status: DC | PRN

## 2020-08-03 MED ORDER — FAMOTIDINE IN NACL 20-0.9 MG/50ML-% IV SOLN: 20.0000 mg | Freq: Once | INTRAVENOUS | Status: DC | PRN

## 2020-08-03 MED ORDER — SODIUM CHLORIDE 0.9 % IV SOLN
1200.0000 mg | Freq: Once | INTRAVENOUS | Status: AC
  Administered 2020-08-03: 1200 mg via INTRAVENOUS

## 2020-08-03 NOTE — Progress Notes (Signed)
°  Diagnosis: COVID-19  Physician: Joya Gaskins, MD  Procedure: [facility] Med: casirivimab\imdevimab infusion - Provided patient with casirivimab\imdevimab fact sheet for patients, parents and caregivers prior to infusion.  Complications: No immediate complications noted.  Discharge: Discharged home   [place] 08/03/2020

## 2020-08-03 NOTE — Discharge Instructions (Signed)

## 2020-08-03 NOTE — Progress Notes (Signed)
I connected by phone with  Mrs. Goings to discuss the potential use of an new treatment for mild to moderate COVID-19 viral infection in non-hospitalized patients.   This patient is a age/sex that meets the FDA criteria for Emergency Use Authorization of casirivimab\imdevimab.  Has a (+) direct SARS-CoV-2 viral test result 1. Has mild or moderate COVID-19  2. Is ? 41 years of age and weighs ? 40 kg 3. Is NOT hospitalized due to COVID-19 4. Is NOT requiring oxygen therapy or requiring an increase in baseline oxygen flow rate due to COVID-19 5. Is within 10 days of symptom onset 6. Has at least one of the high risk factor(s) for progression to severe COVID-19 and/or hospitalization as defined in EUA. Specific high risk criteria : Past Medical History:  Diagnosis Date  . Acne    iPledge: [id number]  . Anxiety   . Dermatitis    eczema  . Elevated liver enzymes   . Frequent headaches   . Hx of dysplastic nevus 04/02/2013   Left distal knee. Mild atypia, margins free  . Migraine    2x/6 mos  . Pneumonia 06/30/2016   has finished antibiotics.  still has lingering nighttime cough  . Shoulder pain   ?  ?    Symptom onset  07/28/20   I have spoken and communicated the following to the patient or parent/caregiver:   1. FDA has authorized the emergency use of casirivimab\imdevimab for the treatment of mild to moderate COVID-19 in adults and pediatric patients with positive results of direct SARS-CoV-2 viral testing who are 50 years of age and older weighing at least 40 kg, and who are at high risk for progressing to severe COVID-19 and/or hospitalization.   2. The significant known and potential risks and benefits of casirivimab\imdevimab, and the extent to which such potential risks and benefits are unknown.   3. Information on available alternative treatments and the risks and benefits of those alternatives, including clinical trials.   4. Patients treated with casirivimab\imdevimab  should continue to self-isolate and use infection control measures (e.g., wear mask, isolate, social distance, avoid sharing personal items, clean and disinfect "high touch" surfaces, and frequent handwashing) according to CDC guidelines.    5. The patient or parent/caregiver has the option to accept or refuse casirivimab\imdevimab .   After reviewing this information with the patient, The patient agreed to proceed with receiving casirivimab\imdevimab infusion and will be provided a copy of the Fact sheet prior to receiving the infusion.Rulon Abide, AGNP-C [phone] ([place])

## 2020-08-19 ENCOUNTER — Ambulatory Visit (INDEPENDENT_AMBULATORY_CARE_PROVIDER_SITE_OTHER): Payer: 59 | Admitting: Dermatology

## 2020-08-19 ENCOUNTER — Other Ambulatory Visit: Payer: Self-pay

## 2020-08-19 VITALS — Wt 206.8 lb

## 2020-08-19 DIAGNOSIS — Z79899 Other long term (current) drug therapy: Secondary | ICD-10-CM

## 2020-08-19 DIAGNOSIS — L700 Acne vulgaris: Secondary | ICD-10-CM

## 2020-08-19 MED ORDER — ISOTRETINOIN 30 MG PO CAPS: ORAL_CAPSULE | ORAL | 0 refills | Status: DC

## 2020-08-19 NOTE — Progress Notes (Signed)
   Follow-Up Visit   Subjective  Martha Page is a 39 y.o. female who presents for the following: Acne (patient has been off of Isotretinoin for about 3 weeks now and has noticed an improvement in joint aches and dryness). Patient states that she hasn't had an acne outbreak in about three months now.   The following portions of the chart were reviewed this encounter and updated as appropriate:  Tobacco  Allergies  Meds  Problems  Med Hx  Surg Hx  Fam Hx     Review of Systems:  No other skin or systemic complaints except as noted in HPI or Assessment and Plan.  Objective  Well appearing patient in no apparent distress; mood and affect are within normal limits.  A focused examination was performed including the face . Relevant physical exam findings are noted in the Assessment and Plan.  Objective  Face: Clear other than erythema.  Assessment & Plan  Acne vulgaris -severe on isotretinoin tretinoin week 24.  Her course is complicated and disrupted by Covid infection.  She is now recovered.  She has not met recommended milligram per kilogram dose and she is interested in continuing to meet that goal. Face Week 24 - Plan to continue treatment for 3 more months  Continue Isotretinoin 30mg  2 po QD. #60 0RF. While taking Isotretinoin and for 30 days after you finish the medication, do not get pregnant, do not share pills, do not donate blood. Isotretinoin is best absorbed when taken with a fatty meal. Isotretinoin can make you sensitive to the sun. Daily careful sun protection including sunscreen SPF 30+ when outdoors is recommended.  In office pregnancy test negative - Lot # [id number], expiration date: 01/04/2022  Patient's weight = 94kg Total dose to date = 8,100 Total mg/kg dose to date = 86mg /kg   Ipledge # [phone] IUD, tubal ligation  [facility]    ISOtretinoin (ACCUTANE) 30 MG capsule - Face  Other Related Medications ISOtretinoin (ACCUTANE) 30 MG  capsule   Isotretinoin F/U - 08/19/20 1600      Isotretinoin Follow Up   iPledge # [id number]    Date 08/19/20    Weight 206 lb 12.8 oz (93.8 kg)    Two Forms of Birth Control IUD;Tubal Sterilization    Acne breakouts since last visit? No      Side Effects   Skin WNL    Gastrointestinal WNL    Neurological WNL    Constitutional WNL    Other Side Effects improvement in joint pains and dryness since being off for 3 weeks           Return in about 1 month (around 09/19/2020) for Isotretinoin follow up .  Luther Redo, CMA, am acting as scribe for Sarina Ser, MD .  Documentation: I have reviewed the above documentation for accuracy and completeness, and I agree with the above.  Sarina Ser, MD

## 2020-08-20 ENCOUNTER — Encounter: Payer: Self-pay | Admitting: Dermatology

## 2020-09-23 ENCOUNTER — Other Ambulatory Visit: Payer: Self-pay

## 2020-09-23 ENCOUNTER — Ambulatory Visit (INDEPENDENT_AMBULATORY_CARE_PROVIDER_SITE_OTHER): Payer: 59 | Admitting: Dermatology

## 2020-09-23 VITALS — Wt 206.8 lb

## 2020-09-23 DIAGNOSIS — Z79899 Other long term (current) drug therapy: Secondary | ICD-10-CM

## 2020-09-23 DIAGNOSIS — K130 Diseases of lips: Secondary | ICD-10-CM

## 2020-09-23 DIAGNOSIS — L700 Acne vulgaris: Secondary | ICD-10-CM

## 2020-09-23 DIAGNOSIS — L853 Xerosis cutis: Secondary | ICD-10-CM

## 2020-09-23 MED ORDER — ISOTRETINOIN 30 MG PO CAPS: ORAL_CAPSULE | ORAL | 0 refills | Status: DC

## 2020-09-23 NOTE — Progress Notes (Signed)
   Follow-Up Visit   Subjective  Martha Page is a 39 y.o. female who presents for the following: Acne (Isotretinoin week 28 30mg  2 po QD - patient c/o all over dryness).  The following portions of the chart were reviewed this encounter and updated as appropriate:  Tobacco  Allergies  Meds  Problems  Med Hx  Surg Hx  Fam Hx     Review of Systems:  No other skin or systemic complaints except as noted in HPI or Assessment and Plan.  Objective  Well appearing patient in no apparent distress; mood and affect are within normal limits.  A focused examination was performed including the face. Relevant physical exam findings are noted in the Assessment and Plan.  Objective  the face: Face clear today   Assessment & Plan  Acne vulgaris the face Isotretinoin week 28 -  Chronic, severe, not currently at goal   Continue Isotretinoin 30mg  2 po QD #60 0RF for at least one more month. While taking Isotretinoin and for 30 days after you finish the medication, do not get pregnant, do not share pills, do not donate blood. Isotretinoin is best absorbed when taken with a fatty meal. Isotretinoin can make you sensitive to the sun. Daily careful sun protection including sunscreen SPF 30+ when outdoors is recommended.   In office pregnancy test negative - lot #[id number] expiration date: 01/04/2022  Tubial ligation, IUD  Accutane, [place] # [id number]  Weight: 94 kg  Dose to date: 9,900mg  Total dose to date: 105.3 mg/kg    Isotretinoin F/U - 09/23/20 1500       Isotretinoin Follow Up   iPledge # [id number]    Date 09/23/20    Weight 206 lb 12.8 oz (93.8 kg)    Two Forms of Birth Control IUD;Tubal Sterilization    Acne breakouts since last visit? No      Side Effects   Skin Dry Eyes;Dry Lips;Dry Nose;Dry Skin;Chapped Lips    Gastrointestinal WNL    Neurological WNL    Constitutional WNL             Reordered Medications ISOtretinoin (ACCUTANE) 30 MG  capsule  Other Related Medications ISOtretinoin (ACCUTANE) 30 MG capsule  Cheilitis Secondary to Isotretinoin  - Continue lip balm as directed, Dr. Luvenia Heller Cortibalm or Aquaphor recommended  Xerosis Secondary to Isotretinoin  - diffuse xerotic patches - recommend gentle, hydrating skin care - gentle skin care handout given  Return in about 1 month (around 10/23/2020) for Isotretinoin follow up .  Luther Redo, CMA, am acting as scribe for Sarina Ser, MD .  Documentation: I have reviewed the above documentation for accuracy and completeness, and I agree with the above.  Sarina Ser, MD

## 2020-09-28 ENCOUNTER — Encounter: Payer: Self-pay | Admitting: Dermatology

## 2020-10-21 ENCOUNTER — Ambulatory Visit (INDEPENDENT_AMBULATORY_CARE_PROVIDER_SITE_OTHER): Payer: 59 | Admitting: Dermatology

## 2020-10-21 ENCOUNTER — Other Ambulatory Visit: Payer: Self-pay

## 2020-10-21 VITALS — Wt 206.0 lb

## 2020-10-21 DIAGNOSIS — L719 Rosacea, unspecified: Secondary | ICD-10-CM | POA: Diagnosis not present

## 2020-10-21 DIAGNOSIS — Z79899 Other long term (current) drug therapy: Secondary | ICD-10-CM | POA: Diagnosis not present

## 2020-10-21 DIAGNOSIS — L700 Acne vulgaris: Secondary | ICD-10-CM | POA: Diagnosis not present

## 2020-10-21 NOTE — Progress Notes (Signed)
   Follow-Up Visit   Subjective  Martha Page is a 39 y.o. female who presents for the following: Acne (Isotretinoin 30mg  2 po QD week 32 - ). Patient would like to finish Isotretinoin course due to joint pains, and dryness.  The following portions of the chart were reviewed this encounter and updated as appropriate:   Tobacco  Allergies  Meds  Problems  Med Hx  Surg Hx  Fam Hx     Review of Systems:  No other skin or systemic complaints except as noted in HPI or Assessment and Plan.  Objective  Well appearing patient in no apparent distress; mood and affect are within normal limits.  A focused examination was performed including face, neck, chest and back. Relevant physical exam findings are noted in the Assessment and Plan.  Objective  Face: 1 small papule of the right cheek   Objective  Face: Erythema   Assessment & Plan  Acne vulgaris - Severe; On Isotretinoin -  requiring FDA mandated monthly evaluations and laboratory monitoring; Chronic and Persistent; Not to Goal Face Isotretinoin week 32 -  While taking Isotretinoin and for 30 days after you finish the medication, do not get pregnant, do not share pills, do not donate blood. Isotretinoin is best absorbed when taken with a fatty meal. Isotretinoin can make you sensitive to the sun. Daily careful sun protection including sunscreen SPF 30+ when outdoors is recommended.   Finish Isotretinoin 30mg  2 po QD (patient has 5 days left). Take a pregnancy test 30 days after finishing treatment.   In office pregnancy test negative lot # [id number] expiration date:06/06/2022   Dose to date = 11,700mg  Kg = 94 Total dose to date = 124.5 mg/kg   Isotretinoin F/U - 10/21/20 1600       Isotretinoin Follow Up   iPledge # [id number]    Date 10/21/20    Weight 206 lb (93.4 kg)    Two Forms of Birth Control IUD;Tubal Sterilization    Acne breakouts since last visit? Yes      Dosage   Current (To Date) Dosage (mg)  11,700mg     To Go Dosage (mg) 0mg       Side Effects   Skin Dry Eyes;Dry Lips;Dry Nose;Dry Skin;Chapped Lips;Eye Irritation    Gastrointestinal WNL    Neurological WNL    Constitutional Fatigue;Muscle/joint aches               Other Related Medications ISOtretinoin (ACCUTANE) 30 MG capsule ISOtretinoin (ACCUTANE) 30 MG capsule  Rosacea Face Discussed BBL laser treatment 6-12 mths post Isotretinoin treatment. Consider Mirvaso or Rhofade topical treatment.   Return in about 6 months (around 04/21/2021) for acne follow up .  Luther Redo, CMA, am acting as scribe for Sarina Ser, MD .  Documentation: I have reviewed the above documentation for accuracy and completeness, and I agree with the above.  Sarina Ser, MD

## 2020-10-26 ENCOUNTER — Encounter: Payer: Self-pay | Admitting: Dermatology

## 2021-01-16 IMAGING — RF ESOPHAGUS/BARIUM SWALLOW/TABLET STUDY
10 series · 13 of 13 positions shown · non-contrast
Comparison: 02/22/2018

CLINICAL DATA: Dysphagia with burning pain radiating to upper back
1 week.

EXAM:
ESOPHOGRAM/BARIUM SWALLOW
TECHNIQUE: Combined double contrast and single contrast examination performed
using effervescent crystals, thick barium liquid, and thin barium
liquid.
FLUOROSCOPY TIME:  Fluoroscopy Time:  1 minutes 8 seconds
Radiation Exposure Index (if provided by the fluoroscopic device):
29.5 mGy
Number of Acquired Spot Images: 22

[Series 13: fluoro_barium 2fps_bw · 0.17mm/px · 2 of 2 frames shown (1 of 9)]
[frame 1/2]
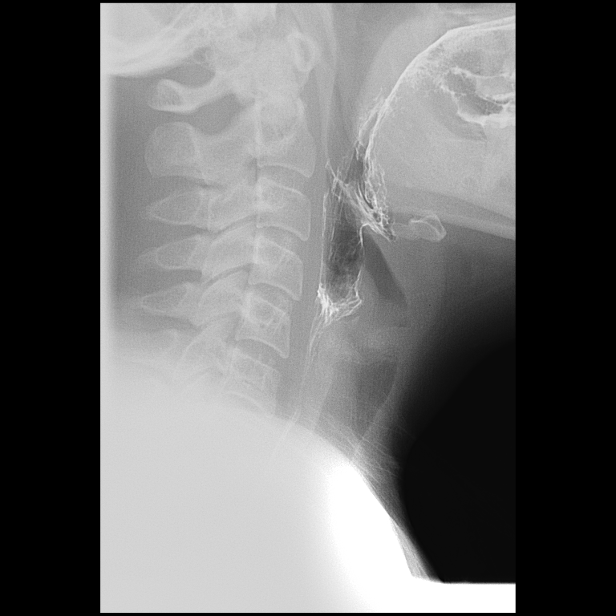
[frame 2/2]
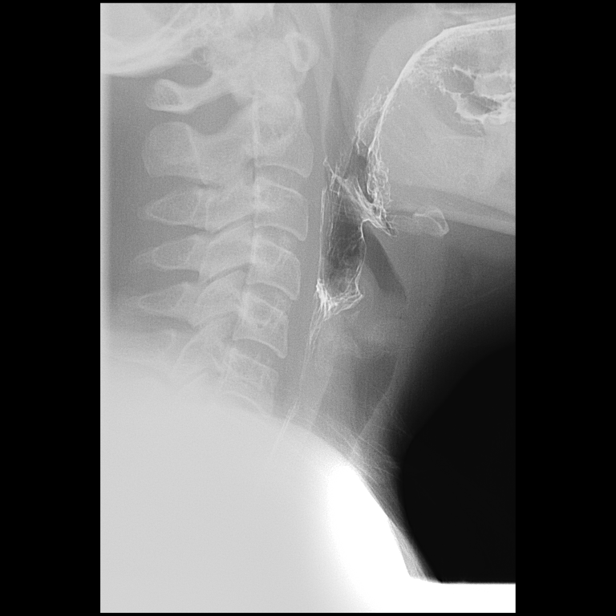

[Series 14: fluoro_barium 2fps_bw · 0.17mm/px · 1 of 1 slices shown (2 of 9)]
[im 1/1]
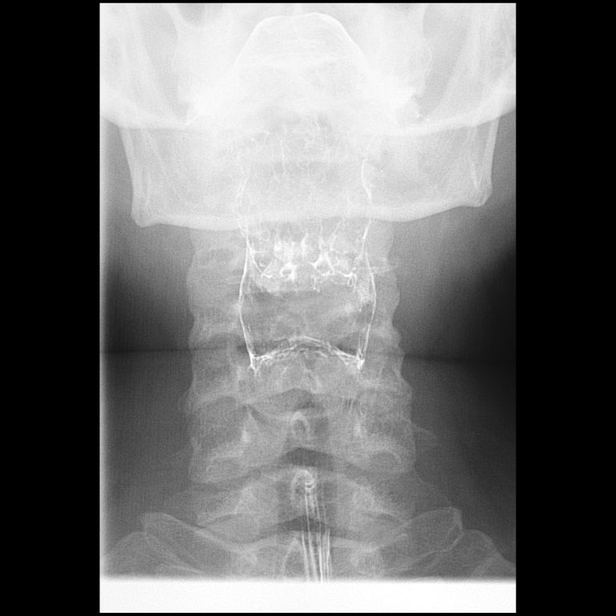

[Series 15: fluoro_barium 2fps_bw · 0.17mm/px · 1 of 1 slices shown (3 of 9)]
[im 1/1]
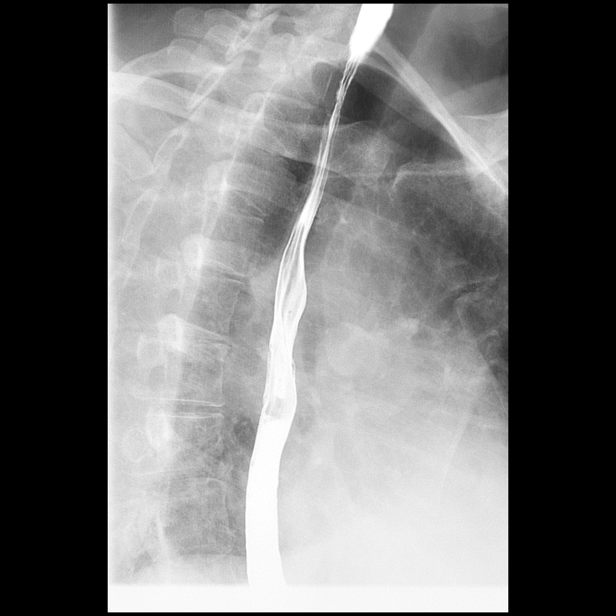

[Series 16: fluoro_barium 2fps_bw · 0.17mm/px · 1 of 1 slices shown (4 of 9)]
[im 1/1]
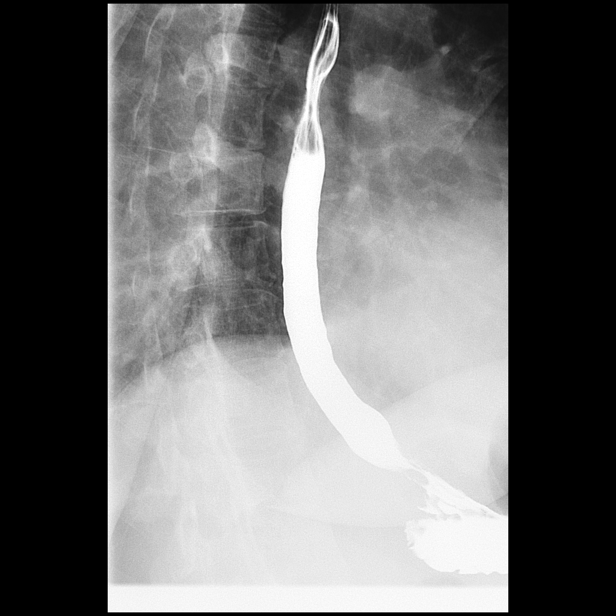

[Series 17: fluoro_barium 2fps_bw · 0.17mm/px · 1 of 1 slices shown (5 of 9)]
[im 1/1]
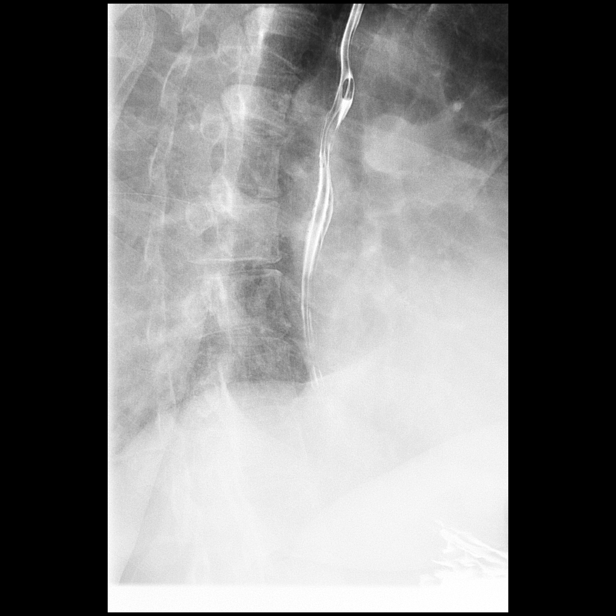

[Series 18: fluoro_barium 2fps_bw · 0.18mm/px · 1 of 1 slices shown (6 of 9)]
[im 1/1]
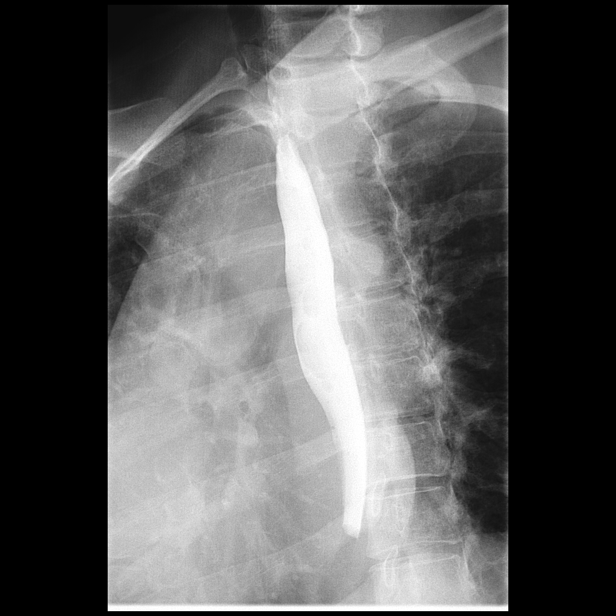

[Series 19: fluoro_barium 2fps_bw · 0.18mm/px · 3 of 3 frames shown (7 of 9)]
[frame 1/3]
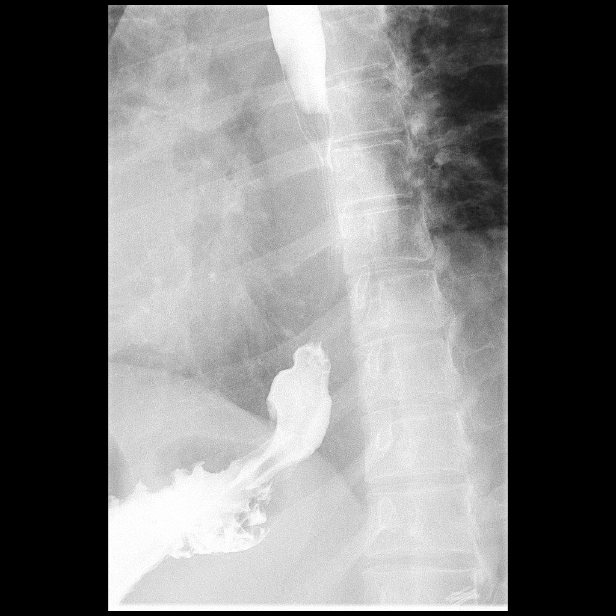
[frame 2/3]
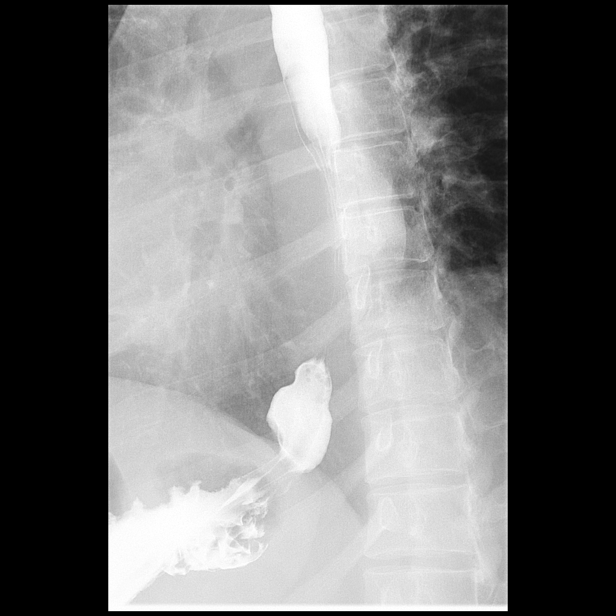
[frame 3/3]
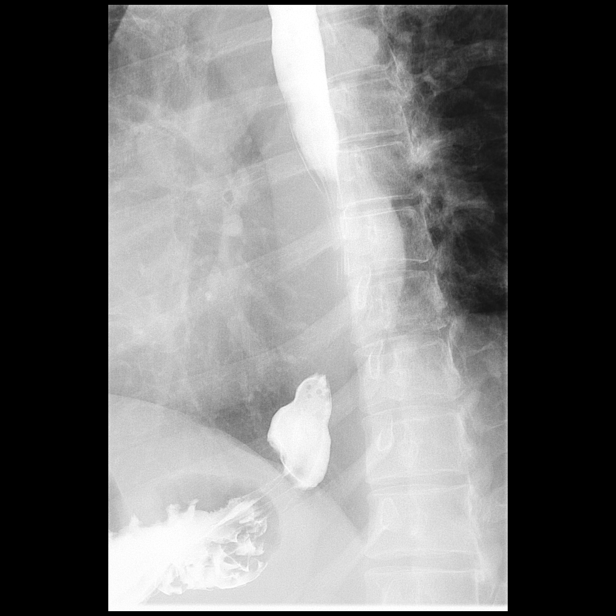

[Series 20: fluoro_barium 2fps_bw · 0.18mm/px · 1 of 1 slices shown (8 of 9)]
[im 1/1]
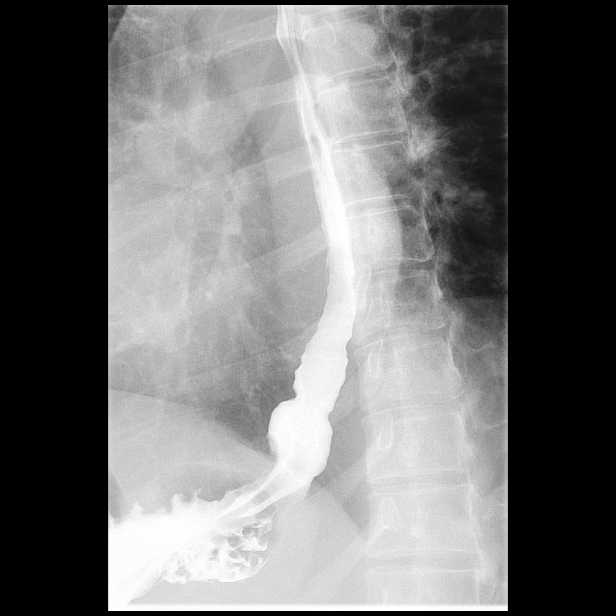

[Series 21: fluoro_barium 2fps_bw · 0.19mm/px · 1 of 1 slices shown (9 of 9)]
[im 1/1]
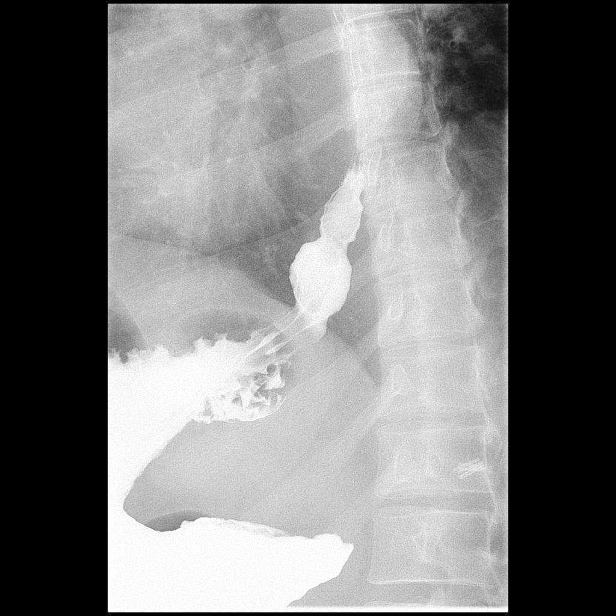

[Series 22: cp_standard · 0.18mm/px · 1 of 1 slices shown]
[im 1/1]
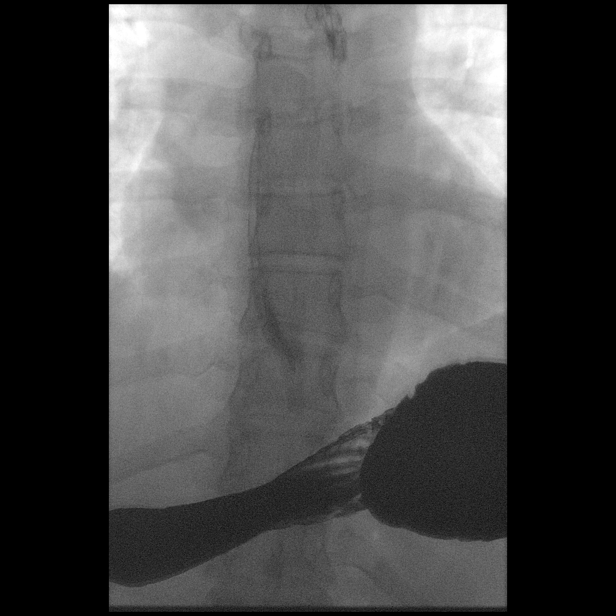

[13 of 13 positions shown; findings below may reference images not displayed]

FINDINGS: Pharynx and hypopharynx are normal. There is no evidence of
aspiration.

The esophagus is normal course, contour and caliber with normal
motility. There is no focal mass, stricture or filling defect. A
small sliding hiatal hernia is demonstrated. No evidence of
gastroesophageal reflux during the exam. Patient passes the 13 mm
barium tablet without difficulty.
IMPRESSION: Small sliding hiatal hernia, otherwise unremarkable barium swallow.

## 2021-04-21 ENCOUNTER — Ambulatory Visit: Payer: 59 | Admitting: Dermatology

## 2021-06-22 ENCOUNTER — Encounter: Payer: Self-pay | Admitting: Gastroenterology

## 2021-06-22 ENCOUNTER — Other Ambulatory Visit: Payer: Self-pay

## 2021-06-22 ENCOUNTER — Ambulatory Visit (INDEPENDENT_AMBULATORY_CARE_PROVIDER_SITE_OTHER): Payer: 59 | Admitting: Gastroenterology

## 2021-06-22 VITALS — BP 102/69 | HR 90 | Ht 65.0 in | Wt 212.8 lb

## 2021-06-22 DIAGNOSIS — T17308D Unspecified foreign body in larynx causing other injury, subsequent encounter: Secondary | ICD-10-CM

## 2021-06-22 DIAGNOSIS — K219 Gastro-esophageal reflux disease without esophagitis: Secondary | ICD-10-CM | POA: Diagnosis not present

## 2021-06-22 NOTE — Progress Notes (Signed)
Primary Care Physician: Philmore Pali, NP  Primary Gastroenterologist:  Dr. Lucilla Lame  Chief Complaint  Patient presents with   Choking     HPI: Martha Page is a 40 y.o. female here with a history of seeing me in the past for dysphagia.  The patient had an episode of pill esophagitis in the past and had come to see me with continued dysphagia.  At that time the patient was sent for a barium swallow that showed the 13 mm pill to go down without any difficulty and a possible small hiatal hernia.  No other abnormalities were found.  The patient was tried on Dexilant and stated that her symptoms were worse and it was causing severe reflux symptoms and she was put on pantoprazole. The patient reports that she has coughing spells and choking with soup and sometimes with candy.  She reports that at night she sometimes will wake up and a coughing fit.  The pantoprazole was increased to twice a day and she has not noticed much change in her coughing but does get heartburn with dyspepsia when she does not take the for tonics.  Past Medical History:  Diagnosis Date   Acne    iPledge: [id number]   Anxiety    Dermatitis    eczema   Elevated liver enzymes    Frequent headaches    Hx of dysplastic nevus 04/02/2013   Left distal knee. Mild atypia, margins free   Migraine    2x/6 mos   Pneumonia 06/30/2016   has finished antibiotics.  still has lingering nighttime cough   Shoulder pain     Current Outpatient Medications  Medication Sig Dispense Refill   cetirizine (ZYRTEC) 10 MG tablet TAKE 1 TABLET BY MOUTH EVERY DAY 90 tablet 0   ibuprofen (ADVIL) 800 MG tablet Take 800 mg by mouth every 8 (eight) hours as needed.     LORazepam (ATIVAN) 0.5 MG tablet Take 0.25 mg by mouth 2 (two) times daily as needed.     meloxicam (MOBIC) 15 MG tablet Take 15 mg by mouth daily.     MIRENA, 52 MG, 20 MCG/24HR IUD      pantoprazole (PROTONIX) 40 MG tablet Take 1 tablet (40 mg total) by mouth daily.  (Patient taking differently: Take 40 mg by mouth 2 (two) times daily.) 60 tablet 6   PARoxetine (PAXIL-CR) 37.5 MG 24 hr tablet Take 37.5 mg by mouth every morning.     SUMAtriptan (IMITREX) 50 MG tablet Take 50 mg by mouth every 2 (two) hours as needed for migraine. May repeat in 2 hours if headache persists or recurs.     EPINEPHrine 0.3 mg/0.3 mL IJ SOAJ injection Inject 0.3 mg into the muscle once.  (Patient not taking: No sig reported)     ISOtretinoin (ACCUTANE) 30 MG capsule Take 1 capsule (30 mg total) by mouth 2 (two) times daily. (Patient not taking: Reported on 06/22/2021) 60 capsule 0   ISOtretinoin (ACCUTANE) 30 MG capsule Take 2 caps po QD. (Patient not taking: No sig reported) 60 capsule 0   ISOtretinoin (ACCUTANE) 30 MG capsule Take 2 caps po QD with food. (Patient not taking: No sig reported) 60 capsule 0   tacrolimus (PROTOPIC) 0.1 % ointment Apply topically in the morning and at bedtime. (Patient not taking: Reported on 06/22/2021) 30 g 0   No current facility-administered medications for this visit.    Allergies as of 06/22/2021 - Review Complete 06/22/2021  Allergen Reaction  Noted   Cefazolin Rash and Shortness Of Breath 04/02/2015   Codeine sulfate Itching 04/02/2015   Doxycycline Other (See Comments) 04/02/2015   Red dye Hives 06/01/2016   Sulfa antibiotics Other (See Comments) 04/02/2015   Amoxicillin Rash 04/02/2015   Penicillin v potassium Rash 04/02/2015    ROS:  General: Negative for anorexia, weight loss, fever, chills, fatigue, weakness. ENT: Negative for hoarseness, difficulty swallowing , nasal congestion. CV: Negative for chest pain, angina, palpitations, dyspnea on exertion, peripheral edema.  Respiratory: Negative for dyspnea at rest, dyspnea on exertion, cough, sputum, wheezing.  GI: See history of present illness. GU:  Negative for dysuria, hematuria, urinary incontinence, urinary frequency, nocturnal urination.  Endo: Negative for unusual weight  change.    Physical Examination:   BP 102/69 (BP Location: Right Arm, Patient Position: Sitting, Cuff Size: Large)   Pulse 90   Ht '5\' 5"'$  (1.651 m)   Wt 212 lb 12.8 oz (96.5 kg)   BMI 35.41 kg/m   General: Well-nourished, well-developed in no acute distress.  Eyes: No icterus. Conjunctivae pink. Lungs: Clear to auscultation bilaterally. Non-labored. Heart: Regular rate and rhythm, no murmurs rubs or gallops.  Abdomen: Bowel sounds are normal, nontender, nondistended, no hepatosplenomegaly or masses, no abdominal bruits or hernia , no rebound or guarding.   Extremities: No lower extremity edema. No clubbing or deformities. Neuro: Alert and oriented x 3.  Grossly intact. Skin: Warm and dry, no jaundice.   Psych: Alert and cooperative, normal mood and affect.  Labs:    Imaging Studies: No results found.  Assessment and Plan:   Martha Page is a 40 y.o. y/o female who comes in today with a history of choking and coughing when she eats. No sign of obstruction has been found in previous investigations for her symptoms.  The patient's coughing when she eats may be related to swallowing rather than her esophagus or GI tract.  As for the choking when she is sleeping and not eating this could be due to reflux and regurgitation.  The patient will be set up for esophageal manometry with pH studies and impedance to see if there is reflux of gastric contents causing her to choke at night.  The patient has been explained the plan and agrees with it.     Lucilla Lame, MD. Mar[facility] Regal    Note: This dictation was prepared with Dragon dictation along with smaller phrase technology. Any transcriptional errors that result from this process are unintentional.

## 2021-08-03 ENCOUNTER — Ambulatory Visit: Payer: 59 | Admitting: Dermatology

## 2021-08-19 ENCOUNTER — Other Ambulatory Visit: Payer: Self-pay

## 2021-09-10 ENCOUNTER — Telehealth: Payer: Self-pay

## 2021-09-10 NOTE — Telephone Encounter (Signed)
-----   Message from Lucilla Lame, MD sent at 09/09/2021 12:50 PM EDT ----- Please let the patient know that the manometry was normal meaning that the esophagus is working normally without any muscular problems.  The pH study showed that the acid control was fair with good acid control of the stomach with symptoms correlating with belching meaning that she had belching at the same time she was having reflux but no correlation between coughing and throat clearing with reflux. Therefore what ever is causing her choking, throat clearing and coughing is not related to reflux.

## 2021-09-10 NOTE — Telephone Encounter (Signed)
Pt notified of results through my chart

## 2021-09-13 ENCOUNTER — Ambulatory Visit: Payer: Managed Care, Other (non HMO) | Admitting: Internal Medicine

## 2021-11-11 ENCOUNTER — Ambulatory Visit: Payer: Self-pay | Admitting: Internal Medicine

## 2021-12-02 ENCOUNTER — Other Ambulatory Visit: Payer: Self-pay

## 2021-12-02 ENCOUNTER — Ambulatory Visit (INDEPENDENT_AMBULATORY_CARE_PROVIDER_SITE_OTHER): Payer: 59 | Admitting: Neurology

## 2021-12-02 ENCOUNTER — Encounter: Payer: Self-pay | Admitting: Neurology

## 2021-12-02 VITALS — BP 116/84 | Ht 65.0 in | Wt 212.6 lb

## 2021-12-02 DIAGNOSIS — R55 Syncope and collapse: Secondary | ICD-10-CM

## 2021-12-02 DIAGNOSIS — E559 Vitamin D deficiency, unspecified: Secondary | ICD-10-CM

## 2021-12-02 DIAGNOSIS — R208 Other disturbances of skin sensation: Secondary | ICD-10-CM

## 2021-12-02 DIAGNOSIS — M25519 Pain in unspecified shoulder: Secondary | ICD-10-CM

## 2021-12-02 DIAGNOSIS — R42 Dizziness and giddiness: Secondary | ICD-10-CM

## 2021-12-02 DIAGNOSIS — F411 Generalized anxiety disorder: Secondary | ICD-10-CM | POA: Diagnosis not present

## 2021-12-02 DIAGNOSIS — G4733 Obstructive sleep apnea (adult) (pediatric): Secondary | ICD-10-CM | POA: Diagnosis not present

## 2021-12-02 DIAGNOSIS — Z9989 Dependence on other enabling machines and devices: Secondary | ICD-10-CM

## 2021-12-02 NOTE — Progress Notes (Addendum)
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 She is established as a sleep apnea patient and is a CPAP user, followed last by Martha Page.  Cluster headaches resolved while on CPAP, sumatriptan use now once a month !   Chief complaint according to patient : Martha Page  reports that she has numbness and tingling in an in hands and feet, feet, sometimes feet, sometimes the feeling of muscle spasm thousands little cramps,, I she reports being more forgetful she had a nerve conduction study with Martha Page and it was negative.  A recent eye exam from July last year was also without evidence of abnormality. She reports that her libido is decreased she is too fatigued to desire or feel pleasure, joint pain in shoulder and shoulder hip and knee are reported the feet are burning tingling and sometimes.date The mornings morning she has a splitting pain feeling in her feet being in her feet.  Her hands tingle and her hands tingle and numb but they do not have the same level of pain and level of pain.  She is cold sensitive.  Warm water will actually relieve some of the pain.   Her migraines and headaches have much declined decreased.  She does have irregular bowel habits, she is moving, she has followed with Martha Page , Martha Page, for urogynecology due to urinary leakage.   She has sometimes woken up at night and felt she was choking.  Patient has been worsening after initially having a benefit from Lasix surgery for about 7 or 8 years she is now approaching presbyopia again.    Sometimes she has some dizzy sensation but she does not report pure vertigo sometimes she has burning  sensations or itching sensations in the shin.   Depression treatment : She has switched from cymbalta to effexor and then on paxil.  She had an esophageal manometry with pH study in 2022 which was negative recent eye exam did not show any reason other than aging for changes in vision.   Sleep and medical history in 2020:   Excessive daytime sleepiness has become a problem for productivity, safety. may be ongoing for 3-5 years, may be longer.  She has wondered if she may have Narcolepsy versus "just" hypersomnia. She denies any cataplectic events, sleep paralysis but she has vivid dreams throughout the night and her dreams can continue after she went to the bathroom for example.   She is taken buspirone not for anxiety, but for headaches. No longer Gabapentin or Mirabegron. All sleepiness resolved under CPAP.   Family medical/ sleep history: excessive daytime sleepiness is not present in other family members.   Social history: Martha Page is married, and a busy mother of 79. 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 She has never used tobacco products.      Review of Systems: Out of a complete 14 system review, the patient complains of only the following symptoms, and all other reviewed systems are negative. How likely are you to doze in the following situations: 0 =  not likely, 1 = slight chance, 2 = moderate chance, 3 = high chance  Sitting and Reading? Watching Television? Sitting inactive in a public place (theater or meeting)? Lying down in the afternoon when circumstances permit? Sitting and talking to someone? Sitting quietly after lunch without alcohol? In a car, while stopped for a few minutes in traffic? As a passenger in a car for an hour without a break?  Total = 6 / 24    See above !  She has switched from Cymbalta to Effexor and then on Paxil.     Epworth score / 24  , snoring.  She denies any cataplectic events, sleep paralysis but  she has vivid dreams throughout the night and her dreams can continue after she went to the bathroom for example.    Mother : Fibromyalgia  Brother passed of PE last year, HTN. Age 39, died while hunting.    Social History   Socioeconomic History   Marital status: Married    Spouse name: Martha Page   Number of children: 6   Years of education: Not on file   Highest education level: Some college, no degree  Occupational History   Not on file  Tobacco Use   Smoking status: Former    Types: Cigarettes    Quit date: 07/01/2011    Years since quitting: 10.4   Smokeless tobacco: Never  Vaping Use   Vaping Use: Never used  Substance and Sexual Activity   Alcohol use: No    Alcohol/week: 0.0 standard drinks   Drug use: No   Sexual activity: Yes    Birth control/protection: I.U.D.  Other Topics Concern   Not on file  Social History Narrative   Married.   6 children.   Works as a Agricultural engineer.      Social Determinants of Health   Financial Resource Strain: Not on file  Food Insecurity: Not on file  Transportation Needs: Not on file  Physical Activity: Not on file  Stress: Not on file  Social Connections: Not on file  Intimate Partner Violence: Not on file    Family History  Problem Relation Age of Onset   Stroke Mother    Depression Mother    Heart disease Father    Arthritis Paternal Grandmother    Cancer Paternal Grandmother        lung   Heart disease Paternal Grandmother    Stroke Paternal Grandmother    Hypertension Paternal Grandmother     Past Medical History:  Diagnosis Date   Acne    iPledge: [phone]   Anxiety    Dermatitis    eczema   Elevated liver enzymes    Frequent headaches    Hx of dysplastic nevus 04/02/2013   Left distal knee. Mild atypia, margins free   Migraine    2x/6 mos   Pneumonia 06/30/2016   has finished antibiotics.  still has lingering nighttime cough   Shoulder pain     Past Surgical History:  Procedure Laterality Date    CESAREAN SECTION     CESAREAN SECTION     CHOLECYSTECTOMY  2003   COLONOSCOPY WITH PROPOFOL N/A 04/17/2015   Procedure: COLONOSCOPY WITH PROPOFOL;  Surgeon: Martha Class, MD;  Location: [facility] ENDOSCOPY;  Service: Endoscopy;  Laterality: N/A;   DIAGNOSTIC LAPAROSCOPY WITH REMOVAL OF ECTOPIC PREGNANCY     DILATION AND CURETTAGE OF UTERUS     DUPUYTREN CONTRACTURE RELEASE     ECTOPIC PREGNANCY SURGERY     ESOPHAGOGASTRODUODENOSCOPY (  EGD) WITH PROPOFOL N/A 07/22/2016   Procedure: ESOPHAGOGASTRODUODENOSCOPY (EGD) WITH PROPOFOL;  Surgeon: Lucilla Lame, MD;  Location: [facility];  Service: Endoscopy;  Laterality: N/A;   GALLBLADDER SURGERY  2002   HEMORRHOID SURGERY     HEMORRHOID SURGERY N/A 06/05/2015   Procedure: HEMORRHOIDECTOMY;  Surgeon: Leonie Green, MD;  Location: [facility] ORS;  Service: General;  Laterality: N/A;   INTRAUTERINE DEVICE (IUD) INSERTION     LASIK     TUBAL LIGATION      Current Outpatient Medications  Medication Sig Dispense Refill   cetirizine (ZYRTEC) 10 MG tablet TAKE 1 TABLET BY MOUTH EVERY DAY 90 tablet 0   ibuprofen (ADVIL) 800 MG tablet Take 800 mg by mouth every 8 (eight) hours as needed.     LORazepam (ATIVAN) 0.5 MG tablet Take 0.25 mg by mouth 2 (two) times daily as needed.     MIRENA, 52 MG, 20 MCG/24HR IUD      pantoprazole (PROTONIX) 40 MG tablet Take 1 tablet (40 mg total) by mouth daily. (Patient taking differently: Take 40 mg by mouth 2 (two) times daily.) 60 tablet 6   PARoxetine (PAXIL-CR) 25 MG 24 hr tablet Take 50 mg by mouth in the morning.     SUMAtriptan (IMITREX) 50 MG tablet Take 50 mg by mouth every 2 (two) hours as needed for migraine. May repeat in 2 hours if headache persists or recurs.     No current facility-administered medications for this visit.    Allergies as of 12/02/2021 - Review Complete 12/02/2021  Allergen Reaction Noted   Cefazolin Rash and Shortness Of Breath 04/02/2015   Codeine sulfate Itching  04/02/2015   Red dye Hives 06/01/2016   Sulfa antibiotics Other (See Comments) 04/02/2015   Amoxicillin Rash 04/02/2015   Penicillin v potassium Rash 04/02/2015    Vitals: BP 116/84    Ht _0  (1.651 m)    Wt 212 lb 9.6 oz (96.4 kg)    BMI 35.38 kg/m  Last Weight:  Wt Readings from Last 1 Encounters:  12/02/21 212 lb 9.6 oz (96.4 kg)   SRP:RXYV mass index is 35.38 kg/m.     Last Height:   Ht Readings from Last 1 Encounters:  12/02/21 _1  (1.651 m)    Observations/Objective:  General: The patient is awake, alert and appears not in acute distress. The patient is well groomed. Head: Normocephalic, atraumatic. Neck is supple. Mallampati 2,  neck circumference: 15.5 . Nasal airflow patent ,Respiratory: No SOB. Skin:  Without evidence of edema, or rash Trunk: BMI is 32. The patient's posture is erect  Neurologic exam : The patient is awake and alert, oriented to place and time.   Attention span & concentration ability appears normal.  Speech is fluent,  without  dysarthria, dysphonia or aphasia.  Mood and affect are appropriate. Cranial nerves: Pupils are equal . Extraocular movements  in vertical and horizontal planes intact . Facial motor strength is symmetric and tongue and uvula move midline. Shoulder shrug was symmetrical.  Motor exam:   Normal tone, muscle bulk and symmetric strength in upper extremities. Hip flexion, adduction and abduction are preserved, dorsiflexion is preserved, and heel and toe walk are intact.  She reports Hip pain on the Page.  Coordination: Hand and wrist movements full ROM, strong grip, finger to nose  normal without evidence of ataxia, dysmetria or tremor. Penmanship is unchanged.  Gait and station: Patient walks reportedly without assistive device .she reports hip or knee pain Page  over left when climbing stairs.       Review of referral information: 12-01-2021:   Her primary care physician at [facility] in [place]  many neurological symptoms in nature concerned about possible multiple sclerosis diagnosis requesting referral to neurology, was seen by also had nerve conduction test and x-rays which were normal no history of injury but has joint pain.  She states she no longer can go steps up like normal.   Gastrointestinal symptoms which are not typical for MS, GI work-up so far negative urology work-up negative.   The patient was previously seen by sleep specialist Dr. Brett Fairy at [facility] neurolog[facility] and therefore I received a new referral.  Patient is fully vaccinated for COVID she has her flu vaccine tetanus is up-to-date she will be due again in 2 years, has a history of tubal ligation, cesarean section, hemorrhoidectomy, LASEK surgery.  Past medical history includes GERD, depression with anxiety, obstructive sleep apnea on CPAP, migraines and cluster headaches currently controlled.  History of vitamin D deficiency history of inflammatory acne and POTS postural orthostatic tachycardia syndrome.  Assessment and Plan:   Neurological symptoms of  dyseasthesias, excluding her joint pain or gait problems -those are more likely orthopedic.  nature , yet patient is concerned about possible multiple sclerosis diagnosis.  Urge incontinence present for years ! No syncope, no seizures, no LOC.   PLAn : MS work up or rule out: including MRI cervical spine and Brain: with and without contrast.   I like to obtain a neuropathy panel, labs order.      Follow Up Instructions: RV in 3-4 moths with Martha or me.     I discussed the assessment and treatment plan with the patient. The patient was provided an opportunity to ask questions and all were answered. The patient agreed with the plan and demonstrated an understanding of the instructions.   The patient was advised to call back or seek an in-person evaluation if the symptoms worsen or if the condition fails to improve as anticipated.  I provided 45 minutes of  -face-to-face time during this encounter.   Martha Seat, MD Martha Seat, MD 03/15/3266, 12:45 AM  Certified in Neurology by ABPN Certified in [place] by [facility] [address] [place], [place] [address]

## 2021-12-03 ENCOUNTER — Encounter: Payer: Self-pay | Admitting: Neurology

## 2021-12-03 ENCOUNTER — Telehealth: Payer: Self-pay | Admitting: Neurology

## 2021-12-03 NOTE — Telephone Encounter (Signed)
[employer] order sent to GI, they will obtain the auth and reach out to the patient to schedule.  ?

## 2021-12-08 LAB — CBC WITH DIFFERENTIAL/PLATELET
Basophils Absolute: 0 x10E3/uL (ref 0.0–0.2)
Basos: 0 %
EOS (ABSOLUTE): 0 x10E3/uL (ref 0.0–0.4)
Eos: 0 %
Hematocrit: 39.5 % (ref 34.0–46.6)
Hemoglobin: 13.5 g/dL (ref 11.1–15.9)
Immature Grans (Abs): 0.1 x10E3/uL (ref 0.0–0.1)
Immature Granulocytes: 1 %
Lymphocytes Absolute: 2.4 x10E3/uL (ref 0.7–3.1)
Lymphs: 17 %
MCH: 28.4 pg (ref 26.6–33.0)
MCHC: 34.2 g/dL (ref 31.5–35.7)
MCV: 83 fL (ref 79–97)
Monocytes Absolute: 1 x10E3/uL — ABNORMAL HIGH (ref 0.1–0.9)
Monocytes: 7 %
Neutrophils Absolute: 10.6 x10E3/uL — ABNORMAL HIGH (ref 1.4–7.0)
Neutrophils: 75 %
Platelets: 227 x10E3/uL (ref 150–450)
RBC: 4.76 x10E6/uL (ref 3.77–5.28)
RDW: 13.5 % (ref 11.7–15.4)
WBC: 14 x10E3/uL — ABNORMAL HIGH (ref 3.4–10.8)

## 2021-12-08 LAB — COMPREHENSIVE METABOLIC PANEL WITH GFR
ALT: 7 IU/L (ref 0–32)
AST: 13 IU/L (ref 0–40)
Albumin/Globulin Ratio: 1.5 (ref 1.2–2.2)
Albumin: 4.4 g/dL (ref 3.8–4.8)
Alkaline Phosphatase: 121 IU/L (ref 44–121)
BUN/Creatinine Ratio: 14 (ref 9–23)
BUN: 12 mg/dL (ref 6–24)
Bilirubin Total: 0.4 mg/dL (ref 0.0–1.2)
CO2: 20 mmol/L (ref 20–29)
Calcium: 9.1 mg/dL (ref 8.7–10.2)
Chloride: 102 mmol/L (ref 96–106)
Creatinine, Ser: 0.83 mg/dL (ref 0.57–1.00)
Globulin, Total: 3 g/dL (ref 1.5–4.5)
Glucose: 83 mg/dL (ref 70–99)
Potassium: 3.9 mmol/L (ref 3.5–5.2)
Sodium: 137 mmol/L (ref 134–144)
Total Protein: 7.4 g/dL (ref 6.0–8.5)
eGFR: 90 mL/min/1.73

## 2021-12-08 LAB — MULTIPLE MYELOMA PANEL, SERUM
Albumin SerPl Elph-Mcnc: 3.7 g/dL (ref 2.9–4.4)
Albumin/Glob SerPl: 1.1 (ref 0.7–1.7)
Alpha 1: 0.3 g/dL (ref 0.0–0.4)
Alpha2 Glob SerPl Elph-Mcnc: 0.9 g/dL (ref 0.4–1.0)
B-Globulin SerPl Elph-Mcnc: 0.9 g/dL (ref 0.7–1.3)
Gamma Glob SerPl Elph-Mcnc: 1.6 g/dL (ref 0.4–1.8)
Globulin, Total: 3.7 g/dL (ref 2.2–3.9)
IgA/Immunoglobulin A, Serum: 216 mg/dL (ref 87–352)
IgG (Immunoglobin G), Serum: 1451 mg/dL (ref 586–1602)
IgM (Immunoglobulin M), Srm: 230 mg/dL — ABNORMAL HIGH (ref 26–217)

## 2021-12-08 LAB — HEMOGLOBIN A1C
Est. average glucose Bld gHb Est-mCnc: 117 mg/dL
Hgb A1c MFr Bld: 5.7 % — ABNORMAL HIGH (ref 4.8–5.6)

## 2021-12-08 LAB — HEAVY METALS, BLOOD
Arsenic: 2 ug/L (ref 0–9)
Lead, Blood: <1 ug/dL (ref 0.0–3.4)
Mercury: <1 ug/L (ref 0.0–14.9)

## 2021-12-08 LAB — HOMOCYSTEINE: Homocysteine: 14.2 umol/L (ref 0.0–14.5)

## 2021-12-08 LAB — ANCA PROFILE
Anti-MPO Antibodies: <0.2 U (ref 0.0–0.9)
Anti-PR3 Antibodies: <0.2 U (ref 0.0–0.9)
Atypical pANCA: <1:20 {titer}
C-ANCA: <1:20 {titer}
P-ANCA: <1:20 {titer}

## 2021-12-08 LAB — ANA W/REFLEX: ANA Titer 1: NEGATIVE

## 2021-12-08 LAB — C-REACTIVE PROTEIN: CRP: 5 mg/L (ref 0–10)

## 2021-12-08 LAB — SJOGREN'S SYNDROME ANTIBODS(SSA + SSB)
ENA SSA (RO) Ab: <0.2 AI (ref 0.0–0.9)
ENA SSB (LA) Ab: <0.2 AI (ref 0.0–0.9)

## 2021-12-08 LAB — VITAMIN B12: Vitamin B-12: 407 pg/mL (ref 232–1245)

## 2021-12-08 LAB — TSH: TSH: 1.05 u[IU]/mL (ref 0.450–4.500)

## 2021-12-08 LAB — SEDIMENTATION RATE: Sed Rate: 30 mm/h (ref 0–32)

## 2021-12-13 ENCOUNTER — Encounter: Payer: Self-pay | Admitting: Gastroenterology

## 2021-12-16 ENCOUNTER — Ambulatory Visit
Admission: RE | Admit: 2021-12-16 | Discharge: 2021-12-16 | Disposition: A | Discharge status: Home / Self Care | Payer: 59 | Source: Ambulatory Visit | Attending: Neurology | Admitting: Neurology

## 2021-12-16 DIAGNOSIS — G4733 Obstructive sleep apnea (adult) (pediatric): Secondary | ICD-10-CM

## 2021-12-16 DIAGNOSIS — Z9989 Dependence on other enabling machines and devices: Secondary | ICD-10-CM

## 2021-12-16 DIAGNOSIS — E559 Vitamin D deficiency, unspecified: Secondary | ICD-10-CM

## 2021-12-16 DIAGNOSIS — F411 Generalized anxiety disorder: Secondary | ICD-10-CM

## 2021-12-16 DIAGNOSIS — R55 Syncope and collapse: Secondary | ICD-10-CM

## 2021-12-16 DIAGNOSIS — R42 Dizziness and giddiness: Secondary | ICD-10-CM

## 2021-12-16 DIAGNOSIS — M25519 Pain in unspecified shoulder: Secondary | ICD-10-CM

## 2021-12-16 DIAGNOSIS — R208 Other disturbances of skin sensation: Secondary | ICD-10-CM

## 2021-12-16 MED ORDER — GADOBENATE DIMEGLUMINE 529 MG/ML IV SOLN
19.0000 mL | Freq: Once | INTRAVENOUS | Status: AC | PRN
  Administered 2021-12-16: 19 mL via INTRAVENOUS

## 2021-12-21 ENCOUNTER — Encounter: Payer: Self-pay | Admitting: Gastroenterology

## 2021-12-21 ENCOUNTER — Ambulatory Visit (INDEPENDENT_AMBULATORY_CARE_PROVIDER_SITE_OTHER): Payer: 59 | Admitting: Gastroenterology

## 2021-12-21 ENCOUNTER — Other Ambulatory Visit: Payer: Self-pay

## 2021-12-21 VITALS — BP 105/72 | HR 88 | Temp 98.6°F | Wt 215.0 lb

## 2021-12-21 DIAGNOSIS — K625 Hemorrhage of anus and rectum: Secondary | ICD-10-CM | POA: Diagnosis not present

## 2021-12-21 MED ORDER — NA SULFATE-K SULFATE-MG SULF 17.5-3.13-1.6 GM/177ML PO SOLN: 1.0000 | Freq: Once | ORAL | 0 refills | Status: AC

## 2021-12-21 NOTE — Progress Notes (Signed)
Primary Care Physician: Philmore Pali, NP  Primary Gastroenterologist:  Dr. Lucilla Lame  Chief Complaint  Patient presents with   Rectal Bleeding    HPI: Martha Page is a 41 y.o. female here with a history of seeing me in the past for dysphagia.  The patient subsequently was seen by neurology for numbness and dysphagia without any findings with their work-up.  The patient had a esophageal manometry with pH studies and showed normal manometry with fair acid control without any symptom association probability correlating with her choking or dysphagia but did correlate with belching.  The patient was referred back to me due to a report of rectal bleeding.  The patient had called in reporting that she was having infrequent bowel movements and was not sure if her hemorrhoids were questioning the rectal bleeding.  The patient had a colonoscopy in 2016 by Dr. Rayann Heman which showed external hemorrhoids. She had bleeding for 3 weeks but has not happened in the last week. She also report mucus from her rectum with bowel movements.  Past Medical History:  Diagnosis Date   Acne    iPledge: [phone]   Anxiety    Dermatitis    eczema   Elevated liver enzymes    Frequent headaches    Hx of dysplastic nevus 04/02/2013   Left distal knee. Mild atypia, margins free   Migraine    2x/6 mos   Pneumonia 06/30/2016   has finished antibiotics.  still has lingering nighttime cough   Shoulder pain     Current Outpatient Medications  Medication Sig Dispense Refill   cetirizine (ZYRTEC) 10 MG tablet TAKE 1 TABLET BY MOUTH EVERY DAY 90 tablet 0   ibuprofen (ADVIL) 800 MG tablet Take 800 mg by mouth every 8 (eight) hours as needed.     LORazepam (ATIVAN) 0.5 MG tablet Take 0.25 mg by mouth 2 (two) times daily as needed.     MIRENA, 52 MG, 20 MCG/24HR IUD      pantoprazole (PROTONIX) 40 MG tablet Take 1 tablet (40 mg total) by mouth daily. (Patient taking differently: Take 40 mg by mouth 2 (two) times  daily.) 60 tablet 6   PARoxetine (PAXIL-CR) 25 MG 24 hr tablet Take 50 mg by mouth in the morning.     SUMAtriptan (IMITREX) 50 MG tablet Take 50 mg by mouth every 2 (two) hours as needed for migraine. May repeat in 2 hours if headache persists or recurs.     No current facility-administered medications for this visit.    Allergies as of 12/21/2021 - Review Complete 12/21/2021  Allergen Reaction Noted   Cefazolin Rash and Shortness Of Breath 04/02/2015   Codeine sulfate Itching 04/02/2015   Red dye Hives 06/01/2016   Sulfa antibiotics Other (See Comments) 04/02/2015   Amoxicillin Rash 04/02/2015   Penicillin v potassium Rash 04/02/2015    ROS:  General: Negative for anorexia, weight loss, fever, chills, fatigue, weakness. ENT: Negative for hoarseness, difficulty swallowing , nasal congestion. CV: Negative for chest pain, angina, palpitations, dyspnea on exertion, peripheral edema.  Respiratory: Negative for dyspnea at rest, dyspnea on exertion, cough, sputum, wheezing.  GI: See history of present illness. GU:  Negative for dysuria, hematuria, urinary incontinence, urinary frequency, nocturnal urination.  Endo: Negative for unusual weight change.    Physical Examination:   BP 105/72    Pulse 88    Temp 98.6 F (37 C) (Oral)    Wt 215 lb (97.5 kg)  BMI 35.78 kg/m   General: Well-nourished, well-developed in no acute distress.  Eyes: No icterus. Conjunctivae pink. Lungs: Clear to auscultation bilaterally. Non-labored. Heart: Regular rate and rhythm, no murmurs rubs or gallops.  Abdomen: Bowel sounds are normal, nontender, nondistended, no hepatosplenomegaly or masses, no abdominal bruits or hernia , no rebound or guarding.   Extremities: No lower extremity edema. No clubbing or deformities. Neuro: Alert and oriented x 3.  Grossly intact. Skin: Warm and dry, no jaundice.   Psych: Alert and cooperative, normal mood and affect.  Labs:    Imaging Studies: MR BRAIN W WO  CONTRAST  Result Date: 12/16/2021  [facility] [address], [place], [place] [address] [phone] NEUROIMAGING REPORT STUDY DATE: 12/16/2021 PATIENT NAME: Martha Page DOB: 25-Jul-1981 MRN: [id number] EXAM: MRI Brain with and without contrast ORDERING CLINICIAN:, Asencion Partridge Dohmeier MD CLINICAL HISTORY: 41 year old woman with dysesthesias COMPARISON FILMS: None TECHNIQUE:MRI of the brain with and without contrast was obtained utilizing 5 mm axial slices with T1, T2, T2 flair, SWI and diffusion weighted views.  T1 sagittal, T2 coronal and postcontrast views in the axial and coronal plane were obtained.  CONTRAST: 19 ml Multihance IMAGING SITE: [place] FINDINGS: On sagittal images, the spinal cord is imaged caudally to C3-C4 and is normal in caliber.   The contents of the posterior fossa are of normal size and position.   The pituitary gland and optic chiasm appear normal.    Brain volume appears normal.   The ventricles are normal in size and without distortion.  There are no abnormal extra-axial collections of fluid.  The cerebellum and brainstem appears normal.   The deep gray matter appears normal.  The cerebral hemispheres appear normal.   Diffusion weighted images are normal.  Susceptibility weighted images are normal.   The orbits appear normal.   The VIIth/VIIIth nerve complex appears normal.  The mastoid air cells appear normal.  Mild mucoperiosteal thickening in a couple of the ethmoid air cells.  The other paranasal sinuses appear normal.  Flow voids are identified within the major intracerebral arteries.  After the infusion of contrast material, a normal enhancement pattern is noted.   This is a normal MRI of the brain with and without contrast. INTERPRETING PHYSICIAN: Richard A. Felecia Shelling, MD, PhD, FAAN Certified in  Neuroimaging by [employer] [employer]   MR [place]  Result Date: 12/16/2021  [facility] [address] [place], [place] [address] [phone] NEUROIMAGING REPORT STUDY DATE: 12/16/2021. PATIENT NAME: Martha Page DOB: 1981/08/16 MRN: [id number] EXAM: MRI of the cervical spine with and without contrast ORDERING CLINICIAN: Asencion Partridge Dohmeier MD CLINICAL HISTORY: 41 year old woman with dysesthesias COMPARISON FILMS: None TECHNIQUE: MRI of the cervical spine was obtained utilizing 3 mm sagittal slices from the posterior fossa down to the T3-4 level with T1, T2 and inversion recovery views. In addition 4 mm axial slices from N3-6 down to T1-2 level were included with T2 and gradient echo views. CONTRAST: 19 mL MultiHance IMAGING SITE: [place] FINDINGS: :  On sagittal images, the spine is imaged from above the cervicomedullary junction to T2.  Visible brain appears normal.  The cervicomedullary junction appears normal.  Paravertebral soft tissue appears normal.  The spinal cord is of normal caliber and signal.   The vertebral bodies are normally aligned.   The vertebral bodies have normal signal.  The discs and interspaces were further evaluated  on axial views from C2 to T1 as follows: C2 - C3:  The disc and interspace appear normal. C3 - C4:  The disc and interspace appear normal. C4 - C5:  The disc and interspace appear normal. C5 - C6:  The disc and interspace appear normal. C6 - C7:  The disc and interspace appear normal. C7 - T1:  The disc and interspace appear normal. After the infusion of contrast, a normal enhancement pattern was observed.   This is a normal MRI of the cervical spine with and without contrast. INTERPRETING PHYSICIAN: Richard A. Felecia Shelling, MD, PhD, FAAN Certified in  Neuroimaging by [place] [employer]    Assessment and Plan:   Martha Page is a 41 y.o. y/o female who comes in today with rectal bleeding.  The patient states she is still having some problems with dysphagia and states that she will contact ENT about  this. Because of her rectal bleeding and history of having a colonoscopy back in 2016 the patient will be set up for repeat colonoscopy.  The patient has been explained the plan and agrees with it.     Lucilla Lame, MD. Marval Regal    Note: This dictation was prepared with Dragon dictation along with smaller phrase technology. Any transcriptional errors that result from this process are unintentional.

## 2021-12-21 NOTE — H&P (View-Only) (Signed)
Primary Care Physician: Philmore Pali, NP  Primary Gastroenterologist:  Dr. Lucilla Lame  Chief Complaint  Patient presents with   Rectal Bleeding    HPI: Martha Page is a 41 y.o. female here with a history of seeing me in the past for dysphagia.  The patient subsequently was seen by neurology for numbness and dysphagia without any findings with their work-up.  The patient had a esophageal manometry with pH studies and showed normal manometry with fair acid control without any symptom association probability correlating with her choking or dysphagia but did correlate with belching.  The patient was referred back to me due to a report of rectal bleeding.  The patient had called in reporting that she was having infrequent bowel movements and was not sure if her hemorrhoids were questioning the rectal bleeding.  The patient had a colonoscopy in 2016 by Dr. Rayann Heman which showed external hemorrhoids. She had bleeding for 3 weeks but has not happened in the last week. She also report mucus from her rectum with bowel movements.  Past Medical History:  Diagnosis Date   Acne    iPledge: [id number]   Anxiety    Dermatitis    eczema   Elevated liver enzymes    Frequent headaches    Hx of dysplastic nevus 04/02/2013   Left distal knee. Mild atypia, margins free   Migraine    2x/6 mos   Pneumonia 06/30/2016   has finished antibiotics.  still has lingering nighttime cough   Shoulder pain     Current Outpatient Medications  Medication Sig Dispense Refill   cetirizine (ZYRTEC) 10 MG tablet TAKE 1 TABLET BY MOUTH EVERY DAY 90 tablet 0   ibuprofen (ADVIL) 800 MG tablet Take 800 mg by mouth every 8 (eight) hours as needed.     LORazepam (ATIVAN) 0.5 MG tablet Take 0.25 mg by mouth 2 (two) times daily as needed.     MIRENA, 52 MG, 20 MCG/24HR IUD      pantoprazole (PROTONIX) 40 MG tablet Take 1 tablet (40 mg total) by mouth daily. (Patient taking differently: Take 40 mg by mouth 2 (two) times  daily.) 60 tablet 6   PARoxetine (PAXIL-CR) 25 MG 24 hr tablet Take 50 mg by mouth in the morning.     SUMAtriptan (IMITREX) 50 MG tablet Take 50 mg by mouth every 2 (two) hours as needed for migraine. May repeat in 2 hours if headache persists or recurs.     No current facility-administered medications for this visit.    Allergies as of 12/21/2021 - Review Complete 12/21/2021  Allergen Reaction Noted   Cefazolin Rash and Shortness Of Breath 04/02/2015   Codeine sulfate Itching 04/02/2015   Red dye Hives 06/01/2016   Sulfa antibiotics Other (See Comments) 04/02/2015   Amoxicillin Rash 04/02/2015   Penicillin v potassium Rash 04/02/2015    ROS:  General: Negative for anorexia, weight loss, fever, chills, fatigue, weakness. ENT: Negative for hoarseness, difficulty swallowing , nasal congestion. CV: Negative for chest pain, angina, palpitations, dyspnea on exertion, peripheral edema.  Respiratory: Negative for dyspnea at rest, dyspnea on exertion, cough, sputum, wheezing.  GI: See history of present illness. GU:  Negative for dysuria, hematuria, urinary incontinence, urinary frequency, nocturnal urination.  Endo: Negative for unusual weight change.    Physical Examination:   BP 105/72    Pulse 88    Temp 98.6 F (37 C) (Oral)    Wt 215 lb (97.5 kg)  BMI 35.78 kg/m   General: Well-nourished, well-developed in no acute distress.  Eyes: No icterus. Conjunctivae pink. Lungs: Clear to auscultation bilaterally. Non-labored. Heart: Regular rate and rhythm, no murmurs rubs or gallops.  Abdomen: Bowel sounds are normal, nontender, nondistended, no hepatosplenomegaly or masses, no abdominal bruits or hernia , no rebound or guarding.   Extremities: No lower extremity edema. No clubbing or deformities. Neuro: Alert and oriented x 3.  Grossly intact. Skin: Warm and dry, no jaundice.   Psych: Alert and cooperative, normal mood and affect.  Labs:    Imaging Studies: MR BRAIN W WO  CONTRAST  Result Date: 12/16/2021  [facility] [address], [place], [place] [address] [phone] NEUROIMAGING REPORT STUDY DATE: 12/16/2021 PATIENT NAME: Martha Page DOB: 01/18/1981 MRN: [id number] EXAM: MRI Brain with and without contrast ORDERING CLINICIAN:, Asencion Partridge Dohmeier MD CLINICAL HISTORY: 41 year old woman with dysesthesias COMPARISON FILMS: None TECHNIQUE:MRI of the brain with and without contrast was obtained utilizing 5 mm axial slices with T1, T2, T2 flair, SWI and diffusion weighted views.  T1 sagittal, T2 coronal and postcontrast views in the axial and coronal plane were obtained.  CONTRAST: 19 ml Multihance IMAGING SITE: [place] FINDINGS: On sagittal images, the spinal cord is imaged caudally to C3-C4 and is normal in caliber.   The contents of the posterior fossa are of normal size and position.   The pituitary gland and optic chiasm appear normal.    Brain volume appears normal.   The ventricles are normal in size and without distortion.  There are no abnormal extra-axial collections of fluid.  The cerebellum and brainstem appears normal.   The deep gray matter appears normal.  The cerebral hemispheres appear normal.   Diffusion weighted images are normal.  Susceptibility weighted images are normal.   The orbits appear normal.   The VIIth/VIIIth nerve complex appears normal.  The mastoid air cells appear normal.  Mild mucoperiosteal thickening in a couple of the ethmoid air cells.  The other paranasal sinuses appear normal.  Flow voids are identified within the major intracerebral arteries.  After the infusion of contrast material, a normal enhancement pattern is noted.   This is a normal MRI of the brain with and without contrast. INTERPRETING PHYSICIAN: Richard A. Felecia Shelling, MD, PhD, FAAN Certified in  Neuroimaging by [employer] [employer]   MR [place]  Result Date: 12/16/2021  [facility] [address] [place], [place] [address] [phone] NEUROIMAGING REPORT STUDY DATE: 12/16/2021. PATIENT NAME: Martha Page DOB: 1981/10/05 MRN: [id number] EXAM: MRI of the cervical spine with and without contrast ORDERING CLINICIAN: Asencion Partridge Dohmeier MD CLINICAL HISTORY: 41 year old woman with dysesthesias COMPARISON FILMS: None TECHNIQUE: MRI of the cervical spine was obtained utilizing 3 mm sagittal slices from the posterior fossa down to the T3-4 level with T1, T2 and inversion recovery views. In addition 4 mm axial slices from B7-6 down to T1-2 level were included with T2 and gradient echo views. CONTRAST: 19 mL MultiHance IMAGING SITE: [place] FINDINGS: :  On sagittal images, the spine is imaged from above the cervicomedullary junction to T2.  Visible brain appears normal.  The cervicomedullary junction appears normal.  Paravertebral soft tissue appears normal.  The spinal cord is of normal caliber and signal.   The vertebral bodies are normally aligned.   The vertebral bodies have normal signal.  The discs and interspaces were further evaluated  on axial views from C2 to T1 as follows: C2 - C3:  The disc and interspace appear normal. C3 - C4:  The disc and interspace appear normal. C4 - C5:  The disc and interspace appear normal. C5 - C6:  The disc and interspace appear normal. C6 - C7:  The disc and interspace appear normal. C7 - T1:  The disc and interspace appear normal. After the infusion of contrast, a normal enhancement pattern was observed.   This is a normal MRI of the cervical spine with and without contrast. INTERPRETING PHYSICIAN: Richard A. Felecia Shelling, MD, PhD, FAAN Certified in  Neuroimaging by [employer] [employer]    Assessment and Plan:   Martha Page is a 41 y.o. y/o female who comes in today with rectal bleeding.  The patient states she is still having some problems with dysphagia and states that she will contact ENT about  this. Because of her rectal bleeding and history of having a colonoscopy back in 2016 the patient will be set up for repeat colonoscopy.  The patient has been explained the plan and agrees with it.     Lucilla Lame, MD. Marval Regal    Note: This dictation was prepared with Dragon dictation along with smaller phrase technology. Any transcriptional errors that result from this process are unintentional.

## 2021-12-23 NOTE — Progress Notes (Signed)
Happy Birthday!   : This is a normal MRI of the cervical spine with and without contrast.

## 2021-12-23 NOTE — Progress Notes (Signed)
Dyseasthesias -multiple sites.   Normal MRI brain with an without contrast, no evidence of multiple sclerosis, no tumor, no strokes.

## 2021-12-30 ENCOUNTER — Encounter: Payer: Self-pay | Admitting: Gastroenterology

## 2022-01-10 ENCOUNTER — Ambulatory Visit
Admission: RE | Admit: 2022-01-10 | Discharge: 2022-01-10 | Disposition: A | Discharge status: Home / Self Care | Payer: 59 | Attending: Gastroenterology | Admitting: Gastroenterology

## 2022-01-10 ENCOUNTER — Encounter: Payer: Self-pay | Admitting: Gastroenterology

## 2022-01-10 ENCOUNTER — Encounter
Admission: RE | Disposition: A | Discharge status: Home / Self Care | Payer: Self-pay | Source: Home / Self Care | Attending: Gastroenterology

## 2022-01-10 ENCOUNTER — Other Ambulatory Visit: Payer: Self-pay

## 2022-01-10 ENCOUNTER — Ambulatory Visit: Payer: 59 | Admitting: Anesthesiology

## 2022-01-10 DIAGNOSIS — K625 Hemorrhage of anus and rectum: Secondary | ICD-10-CM | POA: Diagnosis not present

## 2022-01-10 DIAGNOSIS — Z975 Presence of (intrauterine) contraceptive device: Secondary | ICD-10-CM | POA: Diagnosis not present

## 2022-01-10 DIAGNOSIS — Z87891 Personal history of nicotine dependence: Secondary | ICD-10-CM | POA: Diagnosis not present

## 2022-01-10 DIAGNOSIS — X58XXXA Exposure to other specified factors, initial encounter: Secondary | ICD-10-CM | POA: Insufficient documentation

## 2022-01-10 DIAGNOSIS — F419 Anxiety disorder, unspecified: Secondary | ICD-10-CM | POA: Diagnosis not present

## 2022-01-10 DIAGNOSIS — K921 Melena: Secondary | ICD-10-CM | POA: Insufficient documentation

## 2022-01-10 DIAGNOSIS — G4730 Sleep apnea, unspecified: Secondary | ICD-10-CM | POA: Insufficient documentation

## 2022-01-10 DIAGNOSIS — K219 Gastro-esophageal reflux disease without esophagitis: Secondary | ICD-10-CM | POA: Diagnosis not present

## 2022-01-10 DIAGNOSIS — Z79899 Other long term (current) drug therapy: Secondary | ICD-10-CM | POA: Insufficient documentation

## 2022-01-10 DIAGNOSIS — T185XXA Foreign body in anus and rectum, initial encounter: Secondary | ICD-10-CM | POA: Insufficient documentation

## 2022-01-10 DIAGNOSIS — R1310 Dysphagia, unspecified: Secondary | ICD-10-CM | POA: Diagnosis not present

## 2022-01-10 HISTORY — DX: Gastro-esophageal reflux disease without esophagitis: K21.9

## 2022-01-10 HISTORY — PX: FOREIGN BODY REMOVAL: SHX962

## 2022-01-10 HISTORY — DX: Sleep apnea, unspecified: G47.30

## 2022-01-10 HISTORY — PX: COLONOSCOPY WITH PROPOFOL: SHX5780

## 2022-01-10 SURGERY — COLONOSCOPY WITH PROPOFOL
Anesthesia: General | Site: Rectum

## 2022-01-10 MED ORDER — LIDOCAINE HCL (CARDIAC) PF 100 MG/5ML IV SOSY
PREFILLED_SYRINGE | INTRAVENOUS | Status: DC | PRN
  Administered 2022-01-10: 30 mg via INTRAVENOUS

## 2022-01-10 MED ORDER — PHENYLEPHRINE HCL (PRESSORS) 10 MG/ML IV SOLN
INTRAVENOUS | Status: DC | PRN
  Administered 2022-01-10 (×3): 50 ug via INTRAVENOUS

## 2022-01-10 MED ORDER — SODIUM CHLORIDE 0.9 % IV SOLN: INTRAVENOUS | Status: DC

## 2022-01-10 MED ORDER — PROPOFOL 10 MG/ML IV BOLUS
INTRAVENOUS | Status: DC | PRN
  Administered 2022-01-10: 40 mg via INTRAVENOUS
  Administered 2022-01-10 (×2): 30 mg via INTRAVENOUS
  Administered 2022-01-10: 40 mg via INTRAVENOUS
  Administered 2022-01-10 (×3): 30 mg via INTRAVENOUS
  Administered 2022-01-10: 40 mg via INTRAVENOUS
  Administered 2022-01-10: 150 mg via INTRAVENOUS

## 2022-01-10 MED ORDER — STERILE WATER FOR IRRIGATION IR SOLN
Status: DC | PRN
  Administered 2022-01-10: 1

## 2022-01-10 MED ORDER — ACETAMINOPHEN 160 MG/5ML PO SOLN: 325.0000 mg | Freq: Once | ORAL | Status: DC

## 2022-01-10 MED ORDER — ACETAMINOPHEN 325 MG PO TABS: 325.0000 mg | ORAL_TABLET | Freq: Once | ORAL | Status: DC

## 2022-01-10 MED ORDER — LACTATED RINGERS IV SOLN: INTRAVENOUS | Status: DC

## 2022-01-10 SURGICAL SUPPLY — 22 items
CLIP HMST 235XBRD CATH ROT (MISCELLANEOUS) IMPLANT
CLIP RESOLUTION 360 11X235 (MISCELLANEOUS)
ELECT REM PT RETURN 9FT ADLT (ELECTROSURGICAL)
ELECTRODE REM PT RTRN 9FT ADLT (ELECTROSURGICAL) IMPLANT
FORCEPS BIOP RAD 4 LRG CAP 4 (CUTTING FORCEPS) ×1 IMPLANT
GOWN CVR UNV OPN BCK APRN NK (MISCELLANEOUS) ×2 IMPLANT
GOWN ISOL THUMB LOOP REG UNIV (MISCELLANEOUS) ×4
INJECTOR VARIJECT VIN23 (MISCELLANEOUS) IMPLANT
KIT DEFENDO VALVE AND CONN (KITS) IMPLANT
KIT PRC NS LF DISP ENDO (KITS) ×1 IMPLANT
KIT PROCEDURE OLYMPUS (KITS) ×2
MANIFOLD NEPTUNE II (INSTRUMENTS) ×2 IMPLANT
MARKER SPOT ENDO TATTOO 5ML (MISCELLANEOUS) IMPLANT
PROBE APC STR FIRE (PROBE) IMPLANT
RETRIEVER NET ROTH 2.5X230 LF (MISCELLANEOUS) IMPLANT
SNARE COLD EXACTO (MISCELLANEOUS) IMPLANT
SNARE SHORT THROW 13M SML OVAL (MISCELLANEOUS) IMPLANT
SNARE SNG USE RND 15MM (INSTRUMENTS) IMPLANT
SPOT EX ENDOSCOPIC TATTOO (MISCELLANEOUS)
TRAP ETRAP POLY (MISCELLANEOUS) IMPLANT
VARIJECT INJECTOR VIN23 (MISCELLANEOUS)
WATER STERILE IRR 250ML POUR (IV SOLUTION) ×2 IMPLANT

## 2022-01-10 NOTE — Transfer of Care (Signed)
Immediate Anesthesia Transfer of Care Note ? ?Patient: Martha Page ? ?Procedure(s) Performed: COLONOSCOPY WITH PROPOFOL (Rectum) ?FOREIGN BODY REMOVAL (Rectum) ? ?Patient Location: PACU ? ?Anesthesia Type: General ? ?Level of Consciousness: awake, alert  and patient cooperative ? ?Airway and Oxygen Therapy: Patient Spontanous Breathing and Patient connected to supplemental oxygen ? ?Post-op Assessment: Post-op Vital signs reviewed, Patient's Cardiovascular Status Stable, Respiratory Function Stable, Patent Airway and No signs of Nausea or vomiting ? ?Post-op Vital Signs: Reviewed and stable ? ?Complications: No notable events documented. ? ?

## 2022-01-10 NOTE — Interval H&P Note (Signed)
? ?Martha Lame, MD [facility] ?[address] ?[place], [place] [address] ?Phone:[phone] ?Fax : [phone] ? ?Primary Care Physician:  Martha Pali, NP ?Primary Gastroenterologist:  Dr. Allen Page ? ?Pre-Procedure History & Physical: ?HPI:  Martha Page is a 41 y.o. female is here for an colonoscopy. ?  ?Past Medical History:  ?Diagnosis Date  ? Acne   ? iPledge: [phone]  ? Anxiety   ? Dermatitis   ? eczema  ? Elevated liver enzymes   ? Frequent headaches   ? GERD (gastroesophageal reflux disease)   ? Hx of dysplastic nevus 04/02/2013  ? Left distal knee. Mild atypia, margins free  ? Migraine   ? 2x/6 mos  ? Pneumonia 06/30/2016  ? has finished antibiotics.  still has lingering nighttime cough  ? Shoulder pain   ? Sleep apnea   ? uses CPAP  ? ? ?Past Surgical History:  ?Procedure Laterality Date  ? CESAREAN SECTION    ? CESAREAN SECTION    ? CHOLECYSTECTOMY  2003  ? COLONOSCOPY WITH PROPOFOL N/A 04/17/2015  ? Procedure: COLONOSCOPY WITH PROPOFOL;  Surgeon: Martha Class, MD;  Location: [facility] ENDOSCOPY;  Service: Endoscopy;  Laterality: N/A;  ? DIAGNOSTIC LAPAROSCOPY WITH REMOVAL OF ECTOPIC PREGNANCY    ? DILATION AND CURETTAGE OF UTERUS    ? DUPUYTREN CONTRACTURE RELEASE    ? ECTOPIC PREGNANCY SURGERY    ? ESOPHAGOGASTRODUODENOSCOPY (EGD) WITH PROPOFOL N/A 07/22/2016  ? Procedure: ESOPHAGOGASTRODUODENOSCOPY (EGD) WITH PROPOFOL;  Surgeon: Martha Lame, MD;  Location: [facility];  Service: Endoscopy;  Laterality: N/A;  ? GALLBLADDER SURGERY  2002  ? HEMORRHOID SURGERY    ? HEMORRHOID SURGERY N/A 06/05/2015  ? Procedure: HEMORRHOIDECTOMY;  Surgeon: Leonie Green, MD;  Location: [facility] ORS;  Service: General;  Laterality: N/A;  ? INTRAUTERINE DEVICE (IUD) INSERTION    ? LASIK    ? TUBAL LIGATION    ? ? ?Prior to Admission medications   ?Medication Sig Start Date End Date Taking? Authorizing Provider  ?cetirizine (ZYRTEC) 10 MG tablet TAKE 1 TABLET BY MOUTH EVERY DAY 11/20/17  Yes Pleas Koch, NP  ?ibuprofen (ADVIL) 800 MG tablet Take 800 mg by mouth every 8 (eight) hours as needed. 06/01/21  Yes [provider]  ?Multiple Vitamins-Minerals (MULTIVITAMIN ADULT) CHEW Chew by mouth daily.   Yes [provider]  ?pantoprazole (PROTONIX) 40 MG tablet Take 1 tablet (40 mg total) by mouth daily. ?Patient taking differently: Take 40 mg by mouth 2 (two) times daily. 01/15/19  Yes Martha Lame, MD  ?PARoxetine (PAXIL-CR) 25 MG 24 hr tablet Take 50 mg by mouth in the morning. 10/12/21  Yes [provider]  ?SUMAtriptan (IMITREX) 50 MG tablet Take 50 mg by mouth every 2 (two) hours as needed for migraine. May repeat in 2 hours if headache persists or recurs.   Yes [provider]  ?LORazepam (ATIVAN) 0.5 MG tablet Take 0.25 mg by mouth 2 (two) times daily as needed. 06/11/21   [provider]  ?MIRENA, 52 MG, 20 MCG/24HR IUD  10/03/18   [provider]  ? ? ?Allergies as of 12/21/2021 - Review Complete 12/21/2021  ?Allergen Reaction Noted  ? Cefazolin Rash and Shortness Of Breath 04/02/2015  ? Codeine sulfate Itching 04/02/2015  ? Red dye Hives 06/01/2016  ? Sulfa antibiotics Other (See Comments) 04/02/2015  ? Amoxicillin Rash 04/02/2015  ? Penicillin v potassium Rash 04/02/2015  ? ? ?Family History  ?Problem Relation Age of Onset  ?  Stroke Mother   ? Depression Mother   ? Heart disease Father   ? Arthritis Paternal Grandmother   ? Cancer Paternal Grandmother   ?     lung  ? Heart disease Paternal Grandmother   ? Stroke Paternal Grandmother   ? Hypertension Paternal Grandmother   ? ? ?Social History  ? ?Socioeconomic History  ? Marital status: Married  ?  Spouse name: Martha Page  ? Number of children: 6  ? Years of education: Not on file  ? Highest education level: Some college, no degree  ?Occupational History  ? Not on file  ?Tobacco Use  ? Smoking status: Former  ?  Types: Cigarettes  ?  Quit date: 07/01/2011  ?  Years since quitting: 10.5  ? Smokeless tobacco:  Never  ?Vaping Use  ? Vaping Use: Never used  ?Substance and Sexual Activity  ? Alcohol use: No  ?  Alcohol/week: 0.0 standard drinks  ? Drug use: No  ? Sexual activity: Yes  ?  Birth control/protection: I.U.D.  ?Other Topics Concern  ? Not on file  ?Social History Narrative  ? Married.  ? 6 children.  ? Works as a Agricultural engineer.  ?   ? ?Social Determinants of Health  ? ?Financial Resource Strain: Not on file  ?Food Insecurity: Not on file  ?Transportation Needs: Not on file  ?Physical Activity: Not on file  ?Stress: Not on file  ?Social Connections: Not on file  ?Intimate Partner Violence: Not on file  ? ? ?Review of Systems: ?See HPI, otherwise negative ROS ? ?Physical Exam: ?BP 128/80   Pulse 88   Temp 99 ?F (37.2 ?C) (Temporal)   Resp 18   Ht 5' 5.5" (1.664 m)   Wt 96.8 kg   SpO2 96%   BMI 34.97 kg/m?  ?General:   Alert,  pleasant and cooperative in NAD ?Head:  Normocephalic and atraumatic. ?Neck:  Supple; no masses or thyromegaly. ?Lungs:  Clear throughout to auscultation.    ?Heart:  Regular rate and rhythm. ?Abdomen:  Soft, nontender and nondistended. Normal bowel sounds, without guarding, and without rebound.   ?Neurologic:  Alert and  oriented x4;  grossly normal neurologically. ? ?Impression/Plan: ?Martha Page is here for an colonoscopy to be performed for rectal bleeding ? ?Risks, benefits, limitations, and alternatives regarding  colonoscopy have been reviewed with the patient.  Questions have been answered.  All parties agreeable. ? ? ?Martha Lame, MD  01/10/2022, 9:35 AM ?

## 2022-01-10 NOTE — Anesthesia Preprocedure Evaluation (Signed)
Anesthesia Evaluation  ?Patient identified by MRN, date of birth, ID band ?Patient awake ? ? ? ?Reviewed: ?Allergy & Precautions, H&P , NPO status , Patient's Chart, lab work & pertinent test results ? ?Airway ?Mallampati: II ? ?TM Distance: >3 FB ?Neck ROM: full ? ? ? Dental ?no notable dental hx. ? ?  ?Pulmonary ?sleep apnea , former smoker,  ?  ?Pulmonary exam normal ?breath sounds clear to auscultation ? ? ? ? ? ? Cardiovascular ?Normal cardiovascular exam ?Rhythm:regular Rate:Normal ? ? ?  ?Neuro/Psych ? Headaches, PSYCHIATRIC DISORDERS   ? GI/Hepatic ?  ?Endo/Other  ? ? Renal/GU ?  ? ?  ?Musculoskeletal ? ? Abdominal ?  ?Peds ? Hematology ?  ?Anesthesia Other Findings ? ? Reproductive/Obstetrics ? ?  ? ? ? ? ? ? ? ? ? ? ? ? ? ?  ?  ? ? ? ? ? ? ? ? ?Anesthesia Physical ?Anesthesia Plan ? ?ASA: 3 ? ?Anesthesia Plan: General  ? ?Post-op Pain Management: Minimal or no pain anticipated  ? ?Induction: Intravenous ? ?PONV Risk Score and Plan: 3 and Treatment may vary due to age or medical condition, TIVA and Propofol infusion ? ?Airway Management Planned: Natural Airway ? ?Additional Equipment:  ? ?Intra-op Plan:  ? ?Post-operative Plan:  ? ?Informed Consent: I have reviewed the patients History and Physical, chart, labs and discussed the procedure including the risks, benefits and alternatives for the proposed anesthesia with the patient or authorized representative who has indicated his/her understanding and acceptance.  ? ? ? ?Dental Advisory Given ? ?Plan Discussed with: CRNA ? ?Anesthesia Plan Comments:   ? ? ? ? ? ? ?Anesthesia Quick Evaluation ? ?

## 2022-01-10 NOTE — Anesthesia Postprocedure Evaluation (Signed)
Anesthesia Post Note ? ?Patient: Martha Page ? ?Procedure(s) Performed: COLONOSCOPY WITH PROPOFOL (Rectum) ?FOREIGN BODY REMOVAL (Rectum) ? ? ?  ?Patient location during evaluation: PACU ?Anesthesia Type: General ?Level of consciousness: awake and alert and oriented ?Pain management: satisfactory to patient ?Vital Signs Assessment: post-procedure vital signs reviewed and stable ?Respiratory status: spontaneous breathing, nonlabored ventilation and respiratory function stable ?Cardiovascular status: blood pressure returned to baseline and stable ?Postop Assessment: Adequate PO intake and No signs of nausea or vomiting ?Anesthetic complications: no ? ? ?No notable events documented. ? ?Raliegh Ip ? ? ? ? ? ?

## 2022-01-10 NOTE — Op Note (Signed)
[facility] ?Gastroenterology ?Patient Name: Martha Page ?Procedure Date: 01/10/2022 9:35 AM ?MRN: [id number] ?Account #: [id number] ?Date of Birth: 1981-01-09 ?Admit Type: Outpatient ?Age: 41 ?Room: [facility] OR ROOM 01 ?Gender: Female ?Note Status: Finalized ?Instrument Name: [id number] ?Procedure:             Colonoscopy ?Indications:           Hematochezia ?Providers:             Lucilla Lame MD, MD ?Referring MD:          Philmore Pali (Referring MD) ?Medicines:             Propofol per Anesthesia ?Complications:         No immediate complications. ?Procedure:             Pre-Anesthesia Assessment: ?                       - Prior to the procedure, a History and Physical was  ?                       performed, and patient medications and allergies were  ?                       reviewed. The patient's tolerance of previous  ?                       anesthesia was also reviewed. The risks and benefits  ?                       of the procedure and the sedation options and risks  ?                       were discussed with the patient. All questions were  ?                       answered, and informed consent was obtained. Prior  ?                       Anticoagulants: The patient has taken no previous  ?                       anticoagulant or antiplatelet agents. ASA Grade  ?                       Assessment: II - A patient with mild systemic disease.  ?                       After reviewing the risks and benefits, the patient  ?                       was deemed in satisfactory condition to undergo the  ?                       procedure. ?                       After obtaining informed consent, the colonoscope was  ?  passed under direct vision. Throughout the procedure,  ?                       the patient's blood pressure, pulse, and oxygen  ?                       saturations were monitored continuously. The  ?                       Colonoscope was introduced through the anus and  ?                        advanced to the the cecum, identified by appendiceal  ?                       orifice and ileocecal valve. The colonoscopy was  ?                       performed without difficulty. The patient tolerated  ?                       the procedure well. The quality of the bowel  ?                       preparation was excellent. ?Findings: ?     The perianal and digital rectal examinations were normal. ?     A foreign body was found in the rectum. Removal of a staple was  ?     accomplished with a regular forceps. ?     The exam was otherwise without abnormality. ?     The terminal ileum appeared normal. ?Impression:            - Foreign body in the rectum. Removal was successful. ?                       - The examination was otherwise normal. ?                       - The examined portion of the ileum was normal. ?Recommendation:        - Discharge patient to home. ?                       - Resume previous diet. ?                       - Continue present medications. ?Procedure Code(s):     --- Professional --- ?                       [phone], Colonoscopy, flexible; with removal of foreign  ?                       body(s) ?Diagnosis Code(s):     --- Professional --- ?                       K92.1, Melena (includes Hematochezia) ?                       T18.5XXA, Foreign body in anus and rectum, initial  ?  encounter ?CPT copyright 2019 [employer]. All rights reserved. ?The codes documented in this report are preliminary and upon coder review may  ?be revised to meet current compliance requirements. ?Lucilla Lame MD, MD ?01/10/2022 10:25:08 AM ?This report has been signed electronically. ?Number of Addenda: 0 ?Note Initiated On: 01/10/2022 9:35 AM ?Scope Withdrawal Time: 0 hours 11 minutes 40 seconds  ?Total Procedure Duration: 0 hours 22 minutes 3 seconds  ?Estimated Blood Loss:  Estimated blood loss: none. ?     [facility] ?

## 2022-01-11 ENCOUNTER — Encounter: Payer: Self-pay | Admitting: Gastroenterology

## 2022-03-24 ENCOUNTER — Ambulatory Visit: Payer: 59 | Admitting: Surgery

## 2022-04-21 ENCOUNTER — Encounter: Payer: Self-pay | Admitting: Internal Medicine

## 2022-04-21 ENCOUNTER — Ambulatory Visit (INDEPENDENT_AMBULATORY_CARE_PROVIDER_SITE_OTHER): Payer: Managed Care, Other (non HMO) | Admitting: Internal Medicine

## 2022-04-21 VITALS — BP 102/68 | HR 98 | Temp 97.1°F | Ht 65.0 in | Wt 206.0 lb

## 2022-04-21 DIAGNOSIS — R7303 Prediabetes: Secondary | ICD-10-CM | POA: Diagnosis not present

## 2022-04-21 DIAGNOSIS — K219 Gastro-esophageal reflux disease without esophagitis: Secondary | ICD-10-CM | POA: Diagnosis not present

## 2022-04-21 DIAGNOSIS — F32A Depression, unspecified: Secondary | ICD-10-CM

## 2022-04-21 DIAGNOSIS — E66812 Obesity, class 2: Secondary | ICD-10-CM | POA: Insufficient documentation

## 2022-04-21 DIAGNOSIS — E66811 Obesity, class 1: Secondary | ICD-10-CM | POA: Insufficient documentation

## 2022-04-21 DIAGNOSIS — Z6833 Body mass index (BMI) 33.0-33.9, adult: Secondary | ICD-10-CM | POA: Insufficient documentation

## 2022-04-21 DIAGNOSIS — Z6834 Body mass index (BMI) 34.0-34.9, adult: Secondary | ICD-10-CM

## 2022-04-21 DIAGNOSIS — Z1231 Encounter for screening mammogram for malignant neoplasm of breast: Secondary | ICD-10-CM

## 2022-04-21 DIAGNOSIS — G4733 Obstructive sleep apnea (adult) (pediatric): Secondary | ICD-10-CM

## 2022-04-21 DIAGNOSIS — G44029 Chronic cluster headache, not intractable: Secondary | ICD-10-CM | POA: Diagnosis not present

## 2022-04-21 DIAGNOSIS — R55 Syncope and collapse: Secondary | ICD-10-CM

## 2022-04-21 DIAGNOSIS — K121 Other forms of stomatitis: Secondary | ICD-10-CM

## 2022-04-21 DIAGNOSIS — F419 Anxiety disorder, unspecified: Secondary | ICD-10-CM

## 2022-04-21 DIAGNOSIS — E6609 Other obesity due to excess calories: Secondary | ICD-10-CM

## 2022-04-21 DIAGNOSIS — R42 Dizziness and giddiness: Secondary | ICD-10-CM

## 2022-04-21 MED ORDER — PANTOPRAZOLE SODIUM 40 MG PO TBEC: 40.0000 mg | DELAYED_RELEASE_TABLET | Freq: Two times a day (BID) | ORAL | 1 refills | Status: DC

## 2022-04-21 MED ORDER — PAROXETINE HCL ER 25 MG PO TB24: 50.0000 mg | ORAL_TABLET | Freq: Every morning | ORAL | 1 refills | Status: DC

## 2022-04-21 NOTE — Assessment & Plan Note (Signed)
Encourage low-carb diet and exercise for weight loss 

## 2022-04-21 NOTE — Progress Notes (Signed)
HPI  Patient presents to clinic today to establish care and for management of the conditions listed below.  Cluster Headaches: These occur every few weeks.  She is not sure what triggers this, maybe allergies. She takes Tylenol,  Ibuprofen or Imitrex as needed with good relief of symptoms.  She does follow with neurology.  GERD: She is not sure what triggers this. She reports intermittent choking episodes but denies breakthrough on Pantoprazole.  Upper GI from 07/2016 reviewed.  OSA: She averages 7 hours of sleep per night without the use of her CPAP.  Sleep study from 05/2019 reviewed.  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 She does not take anything for this.  POTS: She denies recent syncopal episode. She has been seen by cardiology and neurology.  Anxiety and Depression: Chronic, managed on Paroxetine and Lorazepam.  She is not currently seeing a therapist.  She denies SI/HI.  Prediabetes: Her last A1c was 5.7%, 11/2021.  She is not taking any oral diabetic medication at this time.  She does not check her sugars.  Past Medical History:  Diagnosis Date   Acne    iPledge: [id number]   Anxiety    Dermatitis    eczema   Elevated liver enzymes    Frequent headaches    GERD (gastroesophageal reflux disease)    Hx of dysplastic nevus 04/02/2013   Left distal knee. Mild atypia, margins free   Migraine    2x/6 mos   Pneumonia 06/30/2016   has finished antibiotics.  still has lingering nighttime cough   Shoulder pain    Sleep apnea    uses CPAP    Current Outpatient Medications  Medication Sig Dispense Refill   cetirizine (ZYRTEC) 10 MG tablet TAKE 1 TABLET BY MOUTH EVERY DAY 90 tablet 0   ibuprofen (ADVIL) 800 MG tablet Take 800 mg by mouth every 8 (eight) hours as needed.     LORazepam (ATIVAN) 0.5 MG tablet Take 0.25 mg by mouth 2 (two) times daily as needed.     MIRENA, 52 MG, 20 MCG/24HR IUD      Multiple Vitamins-Minerals (MULTIVITAMIN ADULT) CHEW Chew by mouth daily.      pantoprazole (PROTONIX) 40 MG tablet Take 1 tablet (40 mg total) by mouth daily. (Patient taking differently: Take 40 mg by mouth 2 (two) times daily.) 60 tablet 6   PARoxetine (PAXIL-CR) 25 MG 24 hr tablet Take 50 mg by mouth in the morning.     SUMAtriptan (IMITREX) 50 MG tablet Take 50 mg by mouth every 2 (two) hours as needed for migraine. May repeat in 2 hours if headache persists or recurs.     No current facility-administered medications for this visit.    Allergies  Allergen Reactions   Cefazolin Rash and Shortness Of Breath   Codeine Sulfate Itching   Red Dye Hives   Sulfa Antibiotics Other (See Comments)    "Stomach pain"   Amoxicillin Rash   Penicillin V Potassium Rash    Family History  Problem Relation Age of Onset   Stroke Mother    Depression Mother    Heart disease Father    Arthritis Paternal Grandmother    Cancer Paternal Grandmother        lung   Heart disease Paternal Grandmother    Stroke Paternal Grandmother    Hypertension Paternal Grandmother     Social History   Socioeconomic History   Marital status: Married    Spouse name: Dellis Filbert   Number of children: 6  Years of education: Not on file   Highest education level: Some college, no degree  Occupational History   Not on file  Tobacco Use   Smoking status: Former    Types: Cigarettes    Quit date: 07/01/2011    Years since quitting: 10.8   Smokeless tobacco: Never  Vaping Use   Vaping Use: Never used  Substance and Sexual Activity   Alcohol use: No    Alcohol/week: 0.0 standard drinks of alcohol   Drug use: No   Sexual activity: Yes    Birth control/protection: I.U.D.  Other Topics Concern   Not on file  Social History Narrative   Married.   6 children.   Works as a Agricultural engineer.      Social Determinants of Health   Financial Resource Strain: Not on file  Food Insecurity: Not on file  Transportation Needs: Not on file  Physical Activity: Not on file  Stress: Not on file  Social  Connections: Not on file  Intimate Partner Violence: Not on file    ROS:  Constitutional: Patient reports intermittent headaches.  Denies fever, malaise, fatigue, or abrupt weight changes.  HEENT: Denies eye pain, eye redness, ear pain, ringing in the ears, wax buildup, runny nose, nasal congestion, bloody nose, or sore throat. Respiratory: Denies difficulty breathing, shortness of breath, cough or sputum production.   Cardiovascular: Denies chest pain, chest tightness, palpitations or swelling in the hands or feet.  Gastrointestinal: Denies abdominal pain, bloating, constipation, diarrhea or blood in the stool.  GU: Denies frequency, urgency, pain with urination, blood in urine, odor or discharge. Musculoskeletal: Denies decrease in range of motion, difficulty with gait, muscle pain or joint pain and swelling.  Skin: Denies redness, rashes, lesions or ulcercations.  Neurological: Patient reports intermittent dizziness.  Denies difficulty with memory, difficulty with speech or problems with balance and coordination.  Psych: Patient has a history of anxiety and depression.  Denies SI/HI.  No other specific complaints in a complete review of systems (except as listed in HPI above).  PE:  BP 102/68 (BP Location: Left Arm, Patient Position: Sitting, Cuff Size: Large)   Pulse 98   Temp (!) 97.1 F (36.2 C) (Temporal)   Ht '5\' 5"'$  (1.651 m)   Wt 206 lb (93.4 kg)   SpO2 96%   BMI 34.28 kg/m   Wt Readings from Last 3 Encounters:  01/10/22 213 lb 6.4 oz (96.8 kg)  12/21/21 215 lb (97.5 kg)  12/02/21 212 lb 9.6 oz (96.4 kg)    General: Appears her stated age, obese, in NAD. HEENT: Head: normal shape and size; Eyes: sclera white, no icterus, conjunctiva pink, PERRLA and EOMs intact;  Cardiovascular: Normal rate and rhythm. S1,S2 noted.  No murmur, rubs or gallops noted. No JVD or BLE edema.  Pulmonary/Chest: Normal effort and positive vesicular breath sounds. No respiratory distress. No  wheezes, rales or ronchi noted.  Abdomen: Soft and nontender.  Musculoskeletal: Strength 5/5 BUE/BLE. No difficulty with gait.  Neurological: Alert and oriented. Cranial nerves II-XII grossly intact. Coordination normal.  Psychiatric: Mood and affect normal. Behavior is normal. Judgment and thought content normal.    BMET    Component Value Date/Time   NA 137 12/02/2021 1557   NA 137 05/26/2014 1855   K 3.9 12/02/2021 1557   K 3.3 (L) 05/26/2014 1855   CL 102 12/02/2021 1557   CL 105 05/26/2014 1855   CO2 20 12/02/2021 1557   CO2 20 (L) 05/26/2014 1855  GLUCOSE 83 12/02/2021 1557   GLUCOSE 92 01/24/2018 1543   GLUCOSE 131 (H) 05/26/2014 1855   BUN 12 12/02/2021 1557   BUN 5 (L) 05/26/2014 1855   CREATININE 0.83 12/02/2021 1557   CREATININE 0.61 05/26/2014 1855   CALCIUM 9.1 12/02/2021 1557   CALCIUM 8.4 (L) 05/26/2014 1855   GFRNONAA >60 01/24/2018 1543   GFRNONAA >60 05/26/2014 1855   GFRAA >60 01/24/2018 1543   GFRAA >60 05/26/2014 1855    Lipid Panel     Component Value Date/Time   CHOL 148 10/16/2015 0910   TRIG 67.0 10/16/2015 0910   HDL 44.70 10/16/2015 0910   CHOLHDL 3 10/16/2015 0910   VLDL 13.4 10/16/2015 0910   LDLCALC 90 10/16/2015 0910    CBC    Component Value Date/Time   WBC 14.0 (H) 12/02/2021 1557   WBC 8.2 01/24/2018 1543   RBC 4.76 12/02/2021 1557   RBC 4.62 01/24/2018 1543   HGB 13.5 12/02/2021 1557   HCT 39.5 12/02/2021 1557   PLT 227 12/02/2021 1557   MCV 83 12/02/2021 1557   MCV 87 05/26/2014 1855   MCH 28.4 12/02/2021 1557   MCH 27.9 01/24/2018 1543   MCHC 34.2 12/02/2021 1557   MCHC 33.3 01/24/2018 1543   RDW 13.5 12/02/2021 1557   RDW 18.2 (H) 05/26/2014 1855   LYMPHSABS 2.4 12/02/2021 1557   MONOABS 0.4 04/14/2015 1644   EOSABS 0.0 12/02/2021 1557   BASOSABS 0.0 12/02/2021 1557    Hgb A1C Lab Results  Component Value Date   HGBA1C 5.7 (H) 12/02/2021     Assessment and Plan:    Webb Silversmith, NP

## 2022-04-21 NOTE — Assessment & Plan Note (Signed)
Currently not an issue °We will monitor °

## 2022-04-21 NOTE — Assessment & Plan Note (Signed)
Encourage weight loss as this can help reduce sleep apnea symptoms Encourage use of CPAP

## 2022-04-21 NOTE — Assessment & Plan Note (Signed)
Encourage adequate hydration We will monitor

## 2022-04-21 NOTE — Assessment & Plan Note (Signed)
Continue paroxetine and lorazepam as needed Support offered

## 2022-04-21 NOTE — Assessment & Plan Note (Signed)
Continue Tylenol, ibuprofen and Imitrex as needed

## 2022-04-21 NOTE — Assessment & Plan Note (Signed)
Currently on phentermine for weight loss Encourage low-carb diet and exercise for weight loss

## 2022-04-21 NOTE — Patient Instructions (Signed)
Calorie Counting for Weight Loss Calories are units of energy. Your body needs a certain number of calories from food to keep going throughout the day. When you eat or drink more calories than your body needs, your body stores the extra calories mostly as fat. When you eat or drink fewer calories than your body needs, your body burns fat to get the energy it needs. Calorie counting means keeping track of how many calories you eat and drink each day. Calorie counting can be helpful if you need to lose weight. If you eat fewer calories than your body needs, you should lose weight. Ask your health care provider what a healthy weight is for you. For calorie counting to work, you will need to eat the right number of calories each day to lose a healthy amount of weight per week. A dietitian can help you figure out how many calories you need in a day and will suggest ways to reach your calorie goal. A healthy amount of weight to lose each week is usually 1-2 lb (0.5-0.9 kg). This usually means that your daily calorie intake should be reduced by 500-750 calories. Eating 1,200-1,500 calories a day can help most women lose weight. Eating 1,500-1,800 calories a day can help most men lose weight. What do I need to know about calorie counting? Work with your health care provider or dietitian to determine how many calories you should get each day. To meet your daily calorie goal, you will need to: Find out how many calories are in each food that you would like to eat. Try to do this before you eat. Decide how much of the food you plan to eat. Keep a food log. Do this by writing down what you ate and how many calories it had. To successfully lose weight, it is important to balance calorie counting with a healthy lifestyle that includes regular activity. Where do I find calorie information?  The number of calories in a food can be found on a Nutrition Facts label. If a food does not have a Nutrition Facts label, try  to look up the calories online or ask your dietitian for help. Remember that calories are listed per serving. If you choose to have more than one serving of a food, you will have to multiply the calories per serving by the number of servings you plan to eat. For example, the label on a package of bread might say that a serving size is 1 slice and that there are 90 calories in a serving. If you eat 1 slice, you will have eaten 90 calories. If you eat 2 slices, you will have eaten 180 calories. How do I keep a food log? After each time that you eat, record the following in your food log as soon as possible: What you ate. Be sure to include toppings, sauces, and other extras on the food. How much you ate. This can be measured in cups, ounces, or number of items. How many calories were in each food and drink. The total number of calories in the food you ate. Keep your food log near you, such as in a pocket-sized notebook or on an app or website on your mobile phone. Some programs will calculate calories for you and show you how many calories you have left to meet your daily goal. What are some portion-control tips? Know how many calories are in a serving. This will help you know how many servings you can have of a certain   food. Use a measuring cup to measure serving sizes. You could also try weighing out portions on a kitchen scale. With time, you will be able to estimate serving sizes for some foods. Take time to put servings of different foods on your favorite plates or in your favorite bowls and cups so you know what a serving looks like. Try not to eat straight from a food's packaging, such as from a bag or box. Eating straight from the package makes it hard to see how much you are eating and can lead to overeating. Put the amount you would like to eat in a cup or on a plate to make sure you are eating the right portion. Use smaller plates, glasses, and bowls for smaller portions and to prevent  overeating. Try not to multitask. For example, avoid watching TV or using your computer while eating. If it is time to eat, sit down at a table and enjoy your food. This will help you recognize when you are full. It will also help you be more mindful of what and how much you are eating. What are tips for following this plan? Reading food labels Check the calorie count compared with the serving size. The serving size may be smaller than what you are used to eating. Check the source of the calories. Try to choose foods that are high in protein, fiber, and vitamins, and low in saturated fat, trans fat, and sodium. Shopping Read nutrition labels while you shop. This will help you make healthy decisions about which foods to buy. Pay attention to nutrition labels for low-fat or fat-free foods. These foods sometimes have the same number of calories or more calories than the full-fat versions. They also often have added sugar, starch, or salt to make up for flavor that was removed with the fat. Make a grocery list of lower-calorie foods and stick to it. Cooking Try to cook your favorite foods in a healthier way. For example, try baking instead of frying. Use low-fat dairy products. Meal planning Use more fruits and vegetables. One-half of your plate should be fruits and vegetables. Include lean proteins, such as chicken, turkey, and fish. Lifestyle Each week, aim to do one of the following: 150 minutes of moderate exercise, such as walking. 75 minutes of vigorous exercise, such as running. General information Know how many calories are in the foods you eat most often. This will help you calculate calorie counts faster. Find a way of tracking calories that works for you. Get creative. Try different apps or programs if writing down calories does not work for you. What foods should I eat?  Eat nutritious foods. It is better to have a nutritious, high-calorie food, such as an avocado, than a food with  few nutrients, such as a bag of potato chips. Use your calories on foods and drinks that will fill you up and will not leave you hungry soon after eating. Examples of foods that fill you up are nuts and nut butters, vegetables, lean proteins, and high-fiber foods such as whole grains. High-fiber foods are foods with more than 5 g of fiber per serving. Pay attention to calories in drinks. Low-calorie drinks include water and unsweetened drinks. The items listed above may not be a complete list of foods and beverages you can eat. Contact a dietitian for more information. What foods should I limit? Limit foods or drinks that are not good sources of vitamins, minerals, or protein or that are high in unhealthy fats. These   include: Candy. Other sweets. Sodas, specialty coffee drinks, alcohol, and juice. The items listed above may not be a complete list of foods and beverages you should avoid. Contact a dietitian for more information. How do I count calories when eating out? Pay attention to portions. Often, portions are much larger when eating out. Try these tips to keep portions smaller: Consider sharing a meal instead of getting your own. If you get your own meal, eat only half of it. Before you start eating, ask for a container and put half of your meal into it. When available, consider ordering smaller portions from the menu instead of full portions. Pay attention to your food and drink choices. Knowing the way food is cooked and what is included with the meal can help you eat fewer calories. If calories are listed on the menu, choose the lower-calorie options. Choose dishes that include vegetables, fruits, whole grains, low-fat dairy products, and lean proteins. Choose items that are boiled, broiled, grilled, or steamed. Avoid items that are buttered, battered, fried, or served with cream sauce. Items labeled as crispy are usually fried, unless stated otherwise. Choose water, low-fat milk,  unsweetened iced tea, or other drinks without added sugar. If you want an alcoholic beverage, choose a lower-calorie option, such as a glass of wine or light beer. Ask for dressings, sauces, and syrups on the side. These are usually high in calories, so you should limit the amount you eat. If you want a salad, choose a garden salad and ask for grilled meats. Avoid extra toppings such as bacon, cheese, or fried items. Ask for the dressing on the side, or ask for olive oil and vinegar or lemon to use as dressing. Estimate how many servings of a food you are given. Knowing serving sizes will help you be aware of how much food you are eating at restaurants. Where to find more information Centers for Disease Control and Prevention: www.cdc.gov [place] Department of Agriculture: myplate.gov Summary Calorie counting means keeping track of how many calories you eat and drink each day. If you eat fewer calories than your body needs, you should lose weight. A healthy amount of weight to lose per week is usually 1-2 lb (0.5-0.9 kg). This usually means reducing your daily calorie intake by 500-750 calories. The number of calories in a food can be found on a Nutrition Facts label. If a food does not have a Nutrition Facts label, try to look up the calories online or ask your dietitian for help. Use smaller plates, glasses, and bowls for smaller portions and to prevent overeating. Use your calories on foods and drinks that will fill you up and not leave you hungry shortly after a meal. This information is not intended to replace advice given to you by your health care provider. Make sure you discuss any questions you have with your health care provider. Document Revised: 12/05/2019 Document Reviewed: 12/05/2019 Elsevier Patient Education  2023 [employer]  

## 2022-04-21 NOTE — Assessment & Plan Note (Signed)
Encourage weight loss as this can help reduce reflux symptoms Continue pantoprazole 

## 2022-04-25 ENCOUNTER — Encounter: Payer: Self-pay | Admitting: Internal Medicine

## 2022-05-02 ENCOUNTER — Encounter: Payer: Self-pay | Admitting: Internal Medicine

## 2022-05-10 ENCOUNTER — Encounter: Payer: Self-pay | Admitting: Internal Medicine

## 2022-05-11 MED ORDER — PHENTERMINE HCL 37.5 MG PO CAPS: 37.5000 mg | ORAL_CAPSULE | ORAL | 0 refills | Status: DC

## 2022-05-13 ENCOUNTER — Other Ambulatory Visit: Payer: Self-pay | Admitting: Internal Medicine

## 2022-05-31 ENCOUNTER — Encounter: Payer: Self-pay | Admitting: Internal Medicine

## 2022-06-07 ENCOUNTER — Encounter: Payer: Self-pay | Admitting: Internal Medicine

## 2022-06-09 ENCOUNTER — Encounter: Payer: Self-pay | Admitting: Internal Medicine

## 2022-06-09 ENCOUNTER — Other Ambulatory Visit: Payer: Self-pay | Admitting: Internal Medicine

## 2022-06-10 MED ORDER — PHENTERMINE HCL 37.5 MG PO TABS: 37.5000 mg | ORAL_TABLET | Freq: Every day | ORAL | 0 refills | Status: DC

## 2022-06-10 MED ORDER — PAROXETINE HCL ER 25 MG PO TB24: 50.0000 mg | ORAL_TABLET | Freq: Every morning | ORAL | 1 refills | Status: DC

## 2022-06-19 ENCOUNTER — Encounter: Payer: Self-pay | Admitting: Internal Medicine

## 2022-06-21 MED ORDER — LINACLOTIDE 145 MCG PO CAPS: 145.0000 ug | ORAL_CAPSULE | Freq: Every day | ORAL | 0 refills | Status: DC

## 2022-06-27 ENCOUNTER — Encounter: Payer: Self-pay | Admitting: Internal Medicine

## 2022-07-01 ENCOUNTER — Encounter: Payer: Self-pay | Admitting: Internal Medicine

## 2022-07-01 ENCOUNTER — Ambulatory Visit (INDEPENDENT_AMBULATORY_CARE_PROVIDER_SITE_OTHER): Payer: 59 | Admitting: Internal Medicine

## 2022-07-01 VITALS — BP 116/72 | HR 88 | Resp 18 | Wt 199.6 lb

## 2022-07-01 DIAGNOSIS — L609 Nail disorder, unspecified: Secondary | ICD-10-CM

## 2022-07-01 DIAGNOSIS — Z5181 Encounter for therapeutic drug level monitoring: Secondary | ICD-10-CM

## 2022-07-01 NOTE — Progress Notes (Signed)
Subjective:    Patient ID: Martha Page, female    DOB: 1981-04-19, 41 y.o.   MRN: [id number]  HPI  Patient presents to clinic today with complaint of green discoloration of 3rd and 4th nails of her hand.  She noticed this 3 weeks ago.  She reports she has had this issue in the past, was diagnosed with a fungal nail infection and treated with Terbinafine.  Her last liver enzymes were normal 11/2021 but she has had abnormal liver enzymes in the past.  Review of Systems     Past Medical History:  Diagnosis Date   Acne    iPledge: [phone]   Anxiety    Dermatitis    eczema   Elevated liver enzymes    Frequent headaches    GERD (gastroesophageal reflux disease)    Hx of dysplastic nevus 04/02/2013   Left distal knee. Mild atypia, margins free   Migraine    2x/6 mos   Pneumonia 06/30/2016   has finished antibiotics.  still has lingering nighttime cough   Shoulder pain    Sleep apnea    uses CPAP    Current Outpatient Medications  Medication Sig Dispense Refill   cetirizine (ZYRTEC) 10 MG tablet TAKE 1 TABLET BY MOUTH EVERY DAY 90 tablet 0   ibuprofen (ADVIL) 800 MG tablet Take 800 mg by mouth every 8 (eight) hours as needed.     linaclotide (LINZESS) 145 MCG CAPS capsule Take 1 capsule (145 mcg total) by mouth daily before breakfast. 90 capsule 0   LORazepam (ATIVAN) 0.5 MG tablet Take 0.25 mg by mouth 2 (two) times daily as needed.     MIRENA, 52 MG, 20 MCG/24HR IUD      pantoprazole (PROTONIX) 40 MG tablet Take 1 tablet (40 mg total) by mouth 2 (two) times daily. 180 tablet 1   PARoxetine (PAXIL-CR) 25 MG 24 hr tablet Take 2 tablets (50 mg total) by mouth in the morning. 180 tablet 1   phentermine (ADIPEX-P) 37.5 MG tablet Take 1 tablet (37.5 mg total) by mouth daily before breakfast. 30 tablet 0   SUMAtriptan (IMITREX) 100 MG tablet Take 100 mg by mouth every 2 (two) hours as needed for migraine. May repeat in 2 hours if headache persists or recurs.     No current  facility-administered medications for this visit.    Allergies  Allergen Reactions   Cefazolin Rash and Shortness Of Breath   Codeine Sulfate Itching   Red Dye Hives   Sulfa Antibiotics Other (See Comments)    "Stomach pain"   Amoxicillin Rash   Penicillin V Potassium Rash    Family History  Problem Relation Age of Onset   Stroke Mother    Depression Mother    Interstitial cystitis Mother    Fibromyalgia Mother    Diabetes Mother    Post-traumatic stress disorder Mother    Anxiety disorder Mother    Heart disease Father    Throat cancer Father    Hypertension Father    Healthy Sister    Aortic aneurysm Brother 80   Arthritis Paternal Grandmother    Lung cancer Paternal Grandmother    Heart disease Paternal Grandmother    Stroke Paternal Grandmother    Hypertension Paternal Grandmother     Social History   Socioeconomic History   Marital status: Married    Spouse name: Dellis Filbert   Number of children: 6   Years of education: Not on file   Highest education level: Some  college, no degree  Occupational History   Not on file  Tobacco Use   Smoking status: Former    Types: Cigarettes    Quit date: 07/01/2011    Years since quitting: 11.0   Smokeless tobacco: Never  Vaping Use   Vaping Use: Never used  Substance and Sexual Activity   Alcohol use: No    Alcohol/week: 0.0 standard drinks of alcohol   Drug use: No   Sexual activity: Yes    Birth control/protection: I.U.D.  Other Topics Concern   Not on file  Social History Narrative   Married.   6 children.   Works as a Agricultural engineer.      Social Determinants of Health   Financial Resource Strain: Not on file  Food Insecurity: Not on file  Transportation Needs: Not on file  Physical Activity: Not on file  Stress: Not on file  Social Connections: Not on file  Intimate Partner Violence: Not on file     Constitutional: Denies fever, malaise, fatigue, headache or abrupt weight changes.  Respiratory: Denies  difficulty breathing, shortness of breath, cough or sputum production.   Cardiovascular: Denies chest pain, chest tightness, palpitations or swelling in the hands or feet.  Skin: Patient reports green discoloration of 3rd and 4th fingernails on right hand.  Denies redness, rashes, lesions or ulcercations.    No other specific complaints in a complete review of systems (except as listed in HPI above).  Objective:   Physical Exam  BP 116/72   Pulse 88   Resp 18   Wt 199 lb 9.6 oz (90.5 kg)   SpO2 97%   BMI 33.22 kg/m   Wt Readings from Last 3 Encounters:  04/21/22 206 lb (93.4 kg)  01/10/22 213 lb 6.4 oz (96.8 kg)  12/21/21 215 lb (97.5 kg)    General: Appears her stated age, obese, in NAD. Skin: Warm, dry and intact. No discoloration noted of the fingernails. Cardiovascular: Normal rate and rhythm. S1,S2 noted.  No murmur, rubs or gallops noted.  Pulmonary/Chest: Normal effort and positive vesicular breath sounds. No respiratory distress. No wheezes, rales or ronchi noted.  Neurological: Alert and oriented.   BMET    Component Value Date/Time   NA 137 12/02/2021 1557   NA 137 05/26/2014 1855   K 3.9 12/02/2021 1557   K 3.3 (L) 05/26/2014 1855   CL 102 12/02/2021 1557   CL 105 05/26/2014 1855   CO2 20 12/02/2021 1557   CO2 20 (L) 05/26/2014 1855   GLUCOSE 83 12/02/2021 1557   GLUCOSE 92 01/24/2018 1543   GLUCOSE 131 (H) 05/26/2014 1855   BUN 12 12/02/2021 1557   BUN 5 (L) 05/26/2014 1855   CREATININE 0.83 12/02/2021 1557   CREATININE 0.61 05/26/2014 1855   CALCIUM 9.1 12/02/2021 1557   CALCIUM 8.4 (L) 05/26/2014 1855   GFRNONAA >60 01/24/2018 1543   GFRNONAA >60 05/26/2014 1855   GFRAA >60 01/24/2018 1543   GFRAA >60 05/26/2014 1855    Lipid Panel     Component Value Date/Time   CHOL 148 10/16/2015 0910   TRIG 67.0 10/16/2015 0910   HDL 44.70 10/16/2015 0910   CHOLHDL 3 10/16/2015 0910   VLDL 13.4 10/16/2015 0910   LDLCALC 90 10/16/2015 0910    CBC     Component Value Date/Time   WBC 14.0 (H) 12/02/2021 1557   WBC 8.2 01/24/2018 1543   RBC 4.76 12/02/2021 1557   RBC 4.62 01/24/2018 1543   HGB 13.5 12/02/2021 1557  HCT 39.5 12/02/2021 1557   PLT 227 12/02/2021 1557   MCV 83 12/02/2021 1557   MCV 87 05/26/2014 1855   MCH 28.4 12/02/2021 1557   MCH 27.9 01/24/2018 1543   MCHC 34.2 12/02/2021 1557   MCHC 33.3 01/24/2018 1543   RDW 13.5 12/02/2021 1557   RDW 18.2 (H) 05/26/2014 1855   LYMPHSABS 2.4 12/02/2021 1557   MONOABS 0.4 04/14/2015 1644   EOSABS 0.0 12/02/2021 1557   BASOSABS 0.0 12/02/2021 1557    Hgb A1C Lab Results  Component Value Date   HGBA1C 5.7 (H) 12/02/2021           Assessment & Plan:   Nail Problem  No evidence of nail fungus at this time Will continue to monitor for now  RTC in 4 months for your annual exam Webb Silversmith, NP

## 2022-07-11 ENCOUNTER — Other Ambulatory Visit: Payer: Self-pay | Admitting: Internal Medicine

## 2022-07-12 MED ORDER — PHENTERMINE HCL 37.5 MG PO TABS: 37.5000 mg | ORAL_TABLET | Freq: Every day | ORAL | 0 refills | Status: DC

## 2022-07-13 MED ORDER — TERBINAFINE HCL 250 MG PO TABS: 250.0000 mg | ORAL_TABLET | Freq: Every day | ORAL | 0 refills | Status: DC

## 2022-07-13 NOTE — Addendum Note (Signed)
Addended by: Jearld Fenton on: 07/13/2022 11:13 AM   Modules accepted: Orders

## 2022-07-25 ENCOUNTER — Encounter: Payer: Self-pay | Admitting: Internal Medicine

## 2022-08-01 ENCOUNTER — Encounter: Payer: Self-pay | Admitting: Internal Medicine

## 2022-08-02 ENCOUNTER — Ambulatory Visit (INDEPENDENT_AMBULATORY_CARE_PROVIDER_SITE_OTHER): Payer: 59 | Admitting: Internal Medicine

## 2022-08-02 ENCOUNTER — Encounter: Payer: Self-pay | Admitting: Internal Medicine

## 2022-08-02 VITALS — BP 122/84 | HR 81 | Temp 97.1°F | Wt 199.0 lb

## 2022-08-02 DIAGNOSIS — Z5181 Encounter for therapeutic drug level monitoring: Secondary | ICD-10-CM

## 2022-08-02 MED ORDER — VALACYCLOVIR HCL 1 G PO TABS: 1000.0000 mg | ORAL_TABLET | Freq: Three times a day (TID) | ORAL | 0 refills | Status: DC

## 2022-08-02 NOTE — Patient Instructions (Signed)

## 2022-08-02 NOTE — Progress Notes (Signed)
Subjective:    Patient ID: Martha Page, female    DOB: 1981/09/24, 41 y.o.   MRN: [id number]  HPI  Patient presents to clinic today with complaint of a skin lesion on her right thigh.  She noticed this 1 week ago. She reports it itches and hurts. It has gotten bigger in size but has not spread. She put Neosporin OTC with minimal relief of symptoms. She denies changes in soap, lotion or detergents. She does not recall getting bit by anything.  Review of Systems     Past Medical History:  Diagnosis Date   Acne    iPledge: [phone]   Anxiety    Dermatitis    eczema   Elevated liver enzymes    Frequent headaches    GERD (gastroesophageal reflux disease)    Hx of dysplastic nevus 04/02/2013   Left distal knee. Mild atypia, margins free   Migraine    2x/6 mos   Pneumonia 06/30/2016   has finished antibiotics.  still has lingering nighttime cough   Shoulder pain    Sleep apnea    uses CPAP    Current Outpatient Medications  Medication Sig Dispense Refill   cetirizine (ZYRTEC) 10 MG tablet TAKE 1 TABLET BY MOUTH EVERY DAY 90 tablet 0   ibuprofen (ADVIL) 800 MG tablet Take 800 mg by mouth every 8 (eight) hours as needed. (Patient not taking: Reported on 07/01/2022)     linaclotide (LINZESS) 145 MCG CAPS capsule Take 1 capsule (145 mcg total) by mouth daily before breakfast. 90 capsule 0   LORazepam (ATIVAN) 0.5 MG tablet Take 0.25 mg by mouth 2 (two) times daily as needed.     MIRENA, 52 MG, 20 MCG/24HR IUD      pantoprazole (PROTONIX) 40 MG tablet Take 1 tablet (40 mg total) by mouth 2 (two) times daily. 180 tablet 1   PARoxetine (PAXIL-CR) 25 MG 24 hr tablet Take 2 tablets (50 mg total) by mouth in the morning. 180 tablet 1   phentermine (ADIPEX-P) 37.5 MG tablet Take 1 tablet (37.5 mg total) by mouth daily before breakfast. 30 tablet 0   SUMAtriptan (IMITREX) 100 MG tablet Take 100 mg by mouth every 2 (two) hours as needed for migraine. May repeat in 2 hours if headache  persists or recurs.     terbinafine (LAMISIL) 250 MG tablet Take 1 tablet (250 mg total) by mouth daily. 30 tablet 0   No current facility-administered medications for this visit.    Allergies  Allergen Reactions   Cefazolin Rash and Shortness Of Breath   Codeine Sulfate Itching   Red Dye Hives   Sulfa Antibiotics Other (See Comments)    "Stomach pain"   Amoxicillin Rash   Penicillin V Potassium Rash    Family History  Problem Relation Age of Onset   Stroke Mother    Depression Mother    Interstitial cystitis Mother    Fibromyalgia Mother    Diabetes Mother    Post-traumatic stress disorder Mother    Anxiety disorder Mother    Heart disease Father    Throat cancer Father    Hypertension Father    Healthy Sister    Aortic aneurysm Brother 41   Arthritis Paternal Grandmother    Lung cancer Paternal Grandmother    Heart disease Paternal Grandmother    Stroke Paternal Grandmother    Hypertension Paternal Grandmother     Social History   Socioeconomic History   Marital status: Married  Spouse name: Dellis Filbert   Number of children: 6   Years of education: Not on file   Highest education level: Some college, no degree  Occupational History   Not on file  Tobacco Use   Smoking status: Former    Types: Cigarettes    Quit date: 07/01/2011    Years since quitting: 11.0   Smokeless tobacco: Never  Vaping Use   Vaping Use: Never used  Substance and Sexual Activity   Alcohol use: No    Alcohol/week: 0.0 standard drinks of alcohol   Drug use: No   Sexual activity: Yes    Birth control/protection: I.U.D.  Other Topics Concern   Not on file  Social History Narrative   Married.   6 children.   Works as a Agricultural engineer.      Social Determinants of Health   Financial Resource Strain: Not on file  Food Insecurity: Not on file  Transportation Needs: Not on file  Physical Activity: Not on file  Stress: Not on file  Social Connections: Not on file  Intimate Partner  Violence: Not on file     Constitutional: Denies fever, malaise, fatigue, headache or abrupt weight changes.  Respiratory: Denies difficulty breathing, shortness of breath, cough or sputum production.   Cardiovascular: Denies chest pain, chest tightness, palpitations or swelling in the hands or feet.  Skin: Patient reports skin lesion of right thigh.  Denies redness, rashes, or ulcercations.    No other specific complaints in a complete review of systems (except as listed in HPI above).  Objective:   Physical Exam  BP 122/84 (BP Location: Left Arm, Patient Position: Sitting, Cuff Size: Normal)   Pulse 81   Temp (!) 97.1 F (36.2 C) (Temporal)   Wt 199 lb (90.3 kg)   SpO2 98%   BMI 33.12 kg/m   Wt Readings from Last 3 Encounters:  07/01/22 199 lb 9.6 oz (90.5 kg)  04/21/22 206 lb (93.4 kg)  01/10/22 213 lb 6.4 oz (96.8 kg)    General: Appears her stated age,obese, in NAD. Skin:Clustered vesicles on erythematous base noted of right anterior thigh. Cardiovascular: Normal rate and rhythm. S1,S2 noted.  No murmur, rubs or gallops noted.  Pulmonary/Chest: Normal effort and positive vesicular breath sounds. No respiratory distress. No wheezes, rales or ronchi noted.  Neurological: Alert and oriented.    BMET    Component Value Date/Time   NA 137 12/02/2021 1557   NA 137 05/26/2014 1855   K 3.9 12/02/2021 1557   K 3.3 (L) 05/26/2014 1855   CL 102 12/02/2021 1557   CL 105 05/26/2014 1855   CO2 20 12/02/2021 1557   CO2 20 (L) 05/26/2014 1855   GLUCOSE 83 12/02/2021 1557   GLUCOSE 92 01/24/2018 1543   GLUCOSE 131 (H) 05/26/2014 1855   BUN 12 12/02/2021 1557   BUN 5 (L) 05/26/2014 1855   CREATININE 0.83 12/02/2021 1557   CREATININE 0.61 05/26/2014 1855   CALCIUM 9.1 12/02/2021 1557   CALCIUM 8.4 (L) 05/26/2014 1855   GFRNONAA >60 01/24/2018 1543   GFRNONAA >60 05/26/2014 1855   GFRAA >60 01/24/2018 1543   GFRAA >60 05/26/2014 1855    Lipid Panel     Component  Value Date/Time   CHOL 148 10/16/2015 0910   TRIG 67.0 10/16/2015 0910   HDL 44.70 10/16/2015 0910   CHOLHDL 3 10/16/2015 0910   VLDL 13.4 10/16/2015 0910   LDLCALC 90 10/16/2015 0910    CBC    Component Value Date/Time  WBC 14.0 (H) 12/02/2021 1557   WBC 8.2 01/24/2018 1543   RBC 4.76 12/02/2021 1557   RBC 4.62 01/24/2018 1543   HGB 13.5 12/02/2021 1557   HCT 39.5 12/02/2021 1557   PLT 227 12/02/2021 1557   MCV 83 12/02/2021 1557   MCV 87 05/26/2014 1855   MCH 28.4 12/02/2021 1557   MCH 27.9 01/24/2018 1543   MCHC 34.2 12/02/2021 1557   MCHC 33.3 01/24/2018 1543   RDW 13.5 12/02/2021 1557   RDW 18.2 (H) 05/26/2014 1855   LYMPHSABS 2.4 12/02/2021 1557   MONOABS 0.4 04/14/2015 1644   EOSABS 0.0 12/02/2021 1557   BASOSABS 0.0 12/02/2021 1557    Hgb A1C Lab Results  Component Value Date   HGBA1C 5.7 (H) 12/02/2021            Assessment & Plan:   Shingles:  RX for Valtrex 1 gm TID x 7 days Notify me for new or worsening symptoms  RTC in 3 months for your annual exam Webb Silversmith, NP

## 2022-08-03 LAB — COMPLETE METABOLIC PANEL WITH GFR
AG Ratio: 1.4 (calc) (ref 1.0–2.5)
ALT: 47 U/L — ABNORMAL HIGH (ref 6–29)
AST: 24 U/L (ref 10–30)
Albumin: 4.3 g/dL (ref 3.6–5.1)
Alkaline phosphatase (APISO): 139 U/L — ABNORMAL HIGH (ref 31–125)
BUN: 11 mg/dL (ref 7–25)
CO2: 23 mmol/L (ref 20–32)
Calcium: 9.1 mg/dL (ref 8.6–10.2)
Chloride: 106 mmol/L (ref 98–110)
Creat: 0.83 mg/dL (ref 0.50–0.99)
Globulin: 3.1 g/dL (calc) (ref 1.9–3.7)
Glucose, Bld: 75 mg/dL (ref 65–139)
Potassium: 4.2 mmol/L (ref 3.5–5.3)
Sodium: 137 mmol/L (ref 135–146)
Total Bilirubin: 0.4 mg/dL (ref 0.2–1.2)
Total Protein: 7.4 g/dL (ref 6.1–8.1)
eGFR: 90 mL/min/{1.73_m2} (ref >=60)

## 2022-08-04 MED ORDER — SEMAGLUTIDE-WEIGHT MANAGEMENT 0.25 MG/0.5ML ~~LOC~~ SOAJ: 0.2500 mg | SUBCUTANEOUS | 1 refills | Status: DC

## 2022-08-08 ENCOUNTER — Encounter: Payer: Self-pay | Admitting: Internal Medicine

## 2022-08-08 MED ORDER — PHENTERMINE-TOPIRAMATE ER 7.5-46 MG PO CP24: 1.0000 | ORAL_CAPSULE | Freq: Every day | ORAL | 0 refills | Status: DC

## 2022-08-08 NOTE — Addendum Note (Signed)
Addended by: Jearld Fenton on: 08/08/2022 11:52 AM   Modules accepted: Orders

## 2022-08-09 ENCOUNTER — Ambulatory Visit: Payer: Self-pay | Admitting: Internal Medicine

## 2022-08-11 ENCOUNTER — Other Ambulatory Visit: Payer: Self-pay | Admitting: Internal Medicine

## 2022-08-11 MED ORDER — TERBINAFINE HCL 250 MG PO TABS
250.0000 mg | ORAL_TABLET | Freq: Every day | ORAL | 0 refills | Status: DC
Start: 2022-08-11 — End: 2022-11-09

## 2022-08-12 MED ORDER — PAROXETINE HCL ER 25 MG PO TB24: 25.0000 mg | ORAL_TABLET | Freq: Every morning | ORAL | 0 refills | Status: DC

## 2022-08-12 MED ORDER — PAROXETINE HCL ER 37.5 MG PO TB24: 37.5000 mg | ORAL_TABLET | Freq: Every day | ORAL | 0 refills | Status: DC

## 2022-08-24 ENCOUNTER — Ambulatory Visit (INDEPENDENT_AMBULATORY_CARE_PROVIDER_SITE_OTHER): Payer: 59 | Admitting: Internal Medicine

## 2022-08-24 ENCOUNTER — Encounter: Payer: Self-pay | Admitting: Internal Medicine

## 2022-08-24 VITALS — BP 128/86 | HR 81 | Temp 97.1°F | Wt 196.0 lb

## 2022-08-24 DIAGNOSIS — G4733 Obstructive sleep apnea (adult) (pediatric): Secondary | ICD-10-CM

## 2022-08-24 DIAGNOSIS — F419 Anxiety disorder, unspecified: Secondary | ICD-10-CM

## 2022-08-24 DIAGNOSIS — F5101 Primary insomnia: Secondary | ICD-10-CM

## 2022-08-24 DIAGNOSIS — R61 Generalized hyperhidrosis: Secondary | ICD-10-CM

## 2022-08-24 DIAGNOSIS — R5383 Other fatigue: Secondary | ICD-10-CM | POA: Diagnosis not present

## 2022-08-24 DIAGNOSIS — R454 Irritability and anger: Secondary | ICD-10-CM | POA: Diagnosis not present

## 2022-08-24 DIAGNOSIS — F32A Depression, unspecified: Secondary | ICD-10-CM

## 2022-08-24 DIAGNOSIS — R232 Flushing: Secondary | ICD-10-CM | POA: Diagnosis not present

## 2022-08-24 DIAGNOSIS — F5104 Psychophysiologic insomnia: Secondary | ICD-10-CM

## 2022-08-24 MED ORDER — TRAZODONE HCL 50 MG PO TABS: 25.0000 mg | ORAL_TABLET | Freq: Every evening | ORAL | 0 refills | Status: DC | PRN

## 2022-08-24 NOTE — Progress Notes (Unsigned)
Subjective:    Patient ID: Martha Page, female    DOB: [birth year], 41 y.o.   MRN: [id number]  HPI  Patient presents to clinic today for follow-up of anxiety and depression. She reports hot flashes, night sweats, fatigue, insomnia, not feeling rested when she wakes up, irritable and anxious. This is a chronic issue that is currently managed on Paroxetine and Ativan.  She has tried Buspirone, Duloxetine, Sertraline and Venlafaxine in the past.  She is not currently seeing a therapist.  She denies SI/HI. She has a history of sleep apnea but does not wear her CPAP.  Review of Systems     Past Medical History:  Diagnosis Date   Acne    iPledge: [phone]   Anxiety    Dermatitis    eczema   Elevated liver enzymes    Frequent headaches    GERD (gastroesophageal reflux disease)    Hx of dysplastic nevus 04/02/2013   Left distal knee. Mild atypia, margins free   Migraine    2x/6 mos   Pneumonia 06/30/2016   has finished antibiotics.  still has lingering nighttime cough   Shoulder pain    Sleep apnea    uses CPAP    Current Outpatient Medications  Medication Sig Dispense Refill   cetirizine (ZYRTEC) 10 MG tablet TAKE 1 TABLET BY MOUTH EVERY DAY 90 tablet 0   ibuprofen (ADVIL) 800 MG tablet Take 800 mg by mouth every 8 (eight) hours as needed. (Patient not taking: Reported on 07/01/2022)     linaclotide (LINZESS) 145 MCG CAPS capsule Take 1 capsule (145 mcg total) by mouth daily before breakfast. 90 capsule 0   LORazepam (ATIVAN) 0.5 MG tablet Take 0.25 mg by mouth 2 (two) times daily as needed.     MIRENA, 52 MG, 20 MCG/24HR IUD      pantoprazole (PROTONIX) 40 MG tablet Take 1 tablet (40 mg total) by mouth 2 (two) times daily. 180 tablet 1   PARoxetine (PAXIL CR) 37.5 MG 24 hr tablet Take 1 tablet (37.5 mg total) by mouth daily. 90 tablet 0   PARoxetine (PAXIL-CR) 25 MG 24 hr tablet Take 1 tablet (25 mg total) by mouth in the morning. 90 tablet 0   Phentermine-Topiramate 7.5-46  MG CP24 Take 1 capsule by mouth daily. 30 capsule 0   SUMAtriptan (IMITREX) 100 MG tablet Take 100 mg by mouth every 2 (two) hours as needed for migraine. May repeat in 2 hours if headache persists or recurs.     terbinafine (LAMISIL) 250 MG tablet Take 1 tablet (250 mg total) by mouth daily. 30 tablet 0   valACYclovir (VALTREX) 1000 MG tablet Take 1 tablet (1,000 mg total) by mouth 3 (three) times daily. For shingles 21 tablet 0   No current facility-administered medications for this visit.    Allergies  Allergen Reactions   Cefazolin Rash and Shortness Of Breath   Codeine Sulfate Itching   Red Dye Hives   Sulfa Antibiotics Other (See Comments)    "Stomach pain"   Amoxicillin Rash   Penicillin V Potassium Rash    Family History  Problem Relation Age of Onset   Stroke Mother    Depression Mother    Interstitial cystitis Mother    Fibromyalgia Mother    Diabetes Mother    Post-traumatic stress disorder Mother    Anxiety disorder Mother    Heart disease Father    Throat cancer Father    Hypertension Father    Healthy Sister  Aortic aneurysm Brother 46   Arthritis Paternal Grandmother    Lung cancer Paternal Grandmother    Heart disease Paternal Grandmother    Stroke Paternal Grandmother    Hypertension Paternal Grandmother     Social History   Socioeconomic History   Marital status: Married    Spouse name: Dellis Filbert   Number of children: 6   Years of education: Not on file   Highest education level: Some college, no degree  Occupational History   Not on file  Tobacco Use   Smoking status: Former    Types: Cigarettes    Quit date: 07/01/2011    Years since quitting: 11.1   Smokeless tobacco: Never  Vaping Use   Vaping Use: Never used  Substance and Sexual Activity   Alcohol use: No    Alcohol/week: 0.0 standard drinks of alcohol   Drug use: No   Sexual activity: Yes    Birth control/protection: I.U.D.  Other Topics Concern   Not on file  Social History  Narrative   Married.   6 children.   Works as a Agricultural engineer.      Social Determinants of Health   Financial Resource Strain: Not on file  Food Insecurity: Not on file  Transportation Needs: Not on file  Physical Activity: Not on file  Stress: Not on file  Social Connections: Not on file  Intimate Partner Violence: Not on file     Constitutional: Denies fever, malaise, fatigue, headache or abrupt weight changes.  HEENT: Denies eye pain, eye redness, ear pain, ringing in the ears, wax buildup, runny nose, nasal congestion, bloody nose, or sore throat. Respiratory: Denies difficulty breathing, shortness of breath, cough or sputum production.   Cardiovascular: Denies chest pain, chest tightness, palpitations or swelling in the hands or feet.  Gastrointestinal: Denies abdominal pain, bloating, constipation, diarrhea or blood in the stool.  GU: Denies urgency, frequency, pain with urination, burning sensation, blood in urine, odor or discharge. Musculoskeletal: Denies decrease in range of motion, difficulty with gait, muscle pain or joint pain and swelling.  Skin: Denies redness, rashes, lesions or ulcercations.  Neurological: Denies dizziness, difficulty with memory, difficulty with speech or problems with balance and coordination.  Psych: Patient has a history of anxiety and depression.  Denies SI/HI.  No other specific complaints in a complete review of systems (except as listed in HPI above).  Objective:   Physical Exam   There were no vitals taken for this visit. Wt Readings from Last 3 Encounters:  08/02/22 199 lb (90.3 kg)  07/01/22 199 lb 9.6 oz (90.5 kg)  04/21/22 206 lb (93.4 kg)    General: Appears their stated age, well developed, well nourished in NAD. Skin: Warm, dry and intact. No rashes, lesions or ulcerations noted. HEENT: Head: normal shape and size; Eyes: sclera white, no icterus, conjunctiva pink, PERRLA and EOMs intact; Ears: Tm's gray and intact, normal light  reflex; Nose: mucosa pink and moist, septum midline; Throat/Mouth: Teeth present, mucosa pink and moist, no exudate, lesions or ulcerations noted.  Neck:  Neck supple, trachea midline. No masses, lumps or thyromegaly present.  Cardiovascular: Normal rate and rhythm. S1,S2 noted.  No murmur, rubs or gallops noted. No JVD or BLE edema. No carotid bruits noted. Pulmonary/Chest: Normal effort and positive vesicular breath sounds. No respiratory distress. No wheezes, rales or ronchi noted.  Abdomen: Soft and nontender. Normal bowel sounds. No distention or masses noted. Liver, spleen and kidneys non palpable. Musculoskeletal: Normal range of motion. No signs  of joint swelling. No difficulty with gait.  Neurological: Alert and oriented. Cranial nerves II-XII grossly intact. Coordination normal.  Psychiatric: Mood and affect normal. Behavior is normal. Judgment and thought content normal.    BMET    Component Value Date/Time   NA 137 08/02/2022 1558   NA 137 12/02/2021 1557   NA 137 05/26/2014 1855   K 4.2 08/02/2022 1558   K 3.3 (L) 05/26/2014 1855   CL 106 08/02/2022 1558   CL 105 05/26/2014 1855   CO2 23 08/02/2022 1558   CO2 20 (L) 05/26/2014 1855   GLUCOSE 75 08/02/2022 1558   GLUCOSE 131 (H) 05/26/2014 1855   BUN 11 08/02/2022 1558   BUN 12 12/02/2021 1557   BUN 5 (L) 05/26/2014 1855   CREATININE 0.83 08/02/2022 1558   CALCIUM 9.1 08/02/2022 1558   CALCIUM 8.4 (L) 05/26/2014 1855   GFRNONAA >60 01/24/2018 1543   GFRNONAA >60 05/26/2014 1855   GFRAA >60 01/24/2018 1543   GFRAA >60 05/26/2014 1855    Lipid Panel     Component Value Date/Time   CHOL 148 10/16/2015 0910   TRIG 67.0 10/16/2015 0910   HDL 44.70 10/16/2015 0910   CHOLHDL 3 10/16/2015 0910   VLDL 13.4 10/16/2015 0910   LDLCALC 90 10/16/2015 0910    CBC    Component Value Date/Time   WBC 14.0 (H) 12/02/2021 1557   WBC 8.2 01/24/2018 1543   RBC 4.76 12/02/2021 1557   RBC 4.62 01/24/2018 1543   HGB 13.5  12/02/2021 1557   HCT 39.5 12/02/2021 1557   PLT 227 12/02/2021 1557   MCV 83 12/02/2021 1557   MCV 87 05/26/2014 1855   MCH 28.4 12/02/2021 1557   MCH 27.9 01/24/2018 1543   MCHC 34.2 12/02/2021 1557   MCHC 33.3 01/24/2018 1543   RDW 13.5 12/02/2021 1557   RDW 18.2 (H) 05/26/2014 1855   LYMPHSABS 2.4 12/02/2021 1557   MONOABS 0.4 04/14/2015 1644   EOSABS 0.0 12/02/2021 1557   BASOSABS 0.0 12/02/2021 1557    Hgb A1C Lab Results  Component Value Date   HGBA1C 5.7 (H) 12/02/2021           Assessment & Plan:     RTC in 2 months for your annual exam Webb Silversmith, NP

## 2022-08-25 DIAGNOSIS — G4700 Insomnia, unspecified: Secondary | ICD-10-CM | POA: Insufficient documentation

## 2022-08-25 LAB — FSH/LH
FSH: 2.3 m[IU]/mL
LH: 6.8 m[IU]/mL

## 2022-08-25 LAB — VITAMIN D 25 HYDROXY (VIT D DEFICIENCY, FRACTURES): Vit D, 25-Hydroxy: 27 ng/mL — ABNORMAL LOW (ref 30–100)

## 2022-08-25 LAB — TSH: TSH: 1.44 mIU/L

## 2022-08-25 LAB — VITAMIN B12: Vitamin B-12: 273 pg/mL (ref 200–1100)

## 2022-08-25 MED ORDER — FLUVOXAMINE MALEATE 25 MG PO TABS: 25.0000 mg | ORAL_TABLET | Freq: Every day | ORAL | 0 refills | Status: DC

## 2022-08-25 MED ORDER — CYANOCOBALAMIN 1000 MCG/ML IJ SOLN: 1000.0000 ug | INTRAMUSCULAR | 1 refills | Status: DC

## 2022-08-25 NOTE — Patient Instructions (Signed)
Insomnia Insomnia is a sleep disorder that makes it difficult to fall asleep or stay asleep. Insomnia can cause fatigue, low energy, difficulty concentrating, mood swings, and poor performance at work or school. There are three different ways to classify insomnia: Difficulty falling asleep. Difficulty staying asleep. Waking up too early in the morning. Any type of insomnia can be long-term (chronic) or short-term (acute). Both are common. Short-term insomnia usually lasts for 3 months or less. Chronic insomnia occurs at least three times a week for longer than 3 months. What are the causes? Insomnia may be caused by another condition, situation, or substance, such as: Having certain mental health conditions, such as anxiety and depression. Using caffeine, alcohol, tobacco, or drugs. Having gastrointestinal conditions, such as gastroesophageal reflux disease (GERD). Having certain medical conditions. These include: Asthma. Alzheimer's disease. Stroke. Chronic pain. An overactive thyroid gland (hyperthyroidism). Other sleep disorders, such as restless legs syndrome and sleep apnea. Menopause. Sometimes, the cause of insomnia may not be known. What increases the risk? Risk factors for insomnia include: Gender. Females are affected more often than males. Age. Insomnia is more common as people get older. Stress and certain medical and mental health conditions. Lack of exercise. Having an irregular work schedule. This may include working night shifts and traveling between different time zones. What are the signs or symptoms? If you have insomnia, the main symptom is having trouble falling asleep or having trouble staying asleep. This may lead to other symptoms, such as: Feeling tired or having low energy. Feeling nervous about going to sleep. Not feeling rested in the morning. Having trouble concentrating. Feeling irritable, anxious, or depressed. How is this diagnosed? This condition  may be diagnosed based on: Your symptoms and medical history. Your health care provider may ask about: Your sleep habits. Any medical conditions you have. Your mental health. A physical exam. How is this treated? Treatment for insomnia depends on the cause. Treatment may focus on treating an underlying condition that is causing the insomnia. Treatment may also include: Medicines to help you sleep. Counseling or therapy. Lifestyle adjustments to help you sleep better. Follow these instructions at home: Eating and drinking  Limit or avoid alcohol, caffeinated beverages, and products that contain nicotine and tobacco, especially close to bedtime. These can disrupt your sleep. Do not eat a large meal or eat spicy foods right before bedtime. This can lead to digestive discomfort that can make it hard for you to sleep. Sleep habits  Keep a sleep diary to help you and your health care provider figure out what could be causing your insomnia. Write down: When you sleep. When you wake up during the night. How well you sleep and how rested you feel the next day. Any side effects of medicines you are taking. What you eat and drink. Make your bedroom a dark, comfortable place where it is easy to fall asleep. Put up shades or blackout curtains to block light from outside. Use a white noise machine to block noise. Keep the temperature cool. Limit screen use before bedtime. This includes: Not watching TV. Not using your smartphone, tablet, or computer. Stick to a routine that includes going to bed and waking up at the same times every day and night. This can help you fall asleep faster. Consider making a quiet activity, such as reading, part of your nighttime routine. Try to avoid taking naps during the day so that you sleep better at night. Get out of bed if you are still awake after   15 minutes of trying to sleep. Keep the lights down, but try reading or doing a quiet activity. When you feel  sleepy, go back to bed. General instructions Take over-the-counter and prescription medicines only as told by your health care provider. Exercise regularly as told by your health care provider. However, avoid exercising in the hours right before bedtime. Use relaxation techniques to manage stress. Ask your health care provider to suggest some techniques that may work well for you. These may include: Breathing exercises. Routines to release muscle tension. Visualizing peaceful scenes. Make sure that you drive carefully. Do not drive if you feel very sleepy. Keep all follow-up visits. This is important. Contact a health care provider if: You are tired throughout the day. You have trouble in your daily routine due to sleepiness. You continue to have sleep problems, or your sleep problems get worse. Get help right away if: You have thoughts about hurting yourself or someone else. Get help right away if you feel like you may hurt yourself or others, or have thoughts about taking your own life. Go to your nearest emergency room or: Call 911. Call the National Suicide Prevention Lifeline at 1-800-273-8255 or 988. This is open 24 hours a day. Text the Crisis Text Line at 741741. Summary Insomnia is a sleep disorder that makes it difficult to fall asleep or stay asleep. Insomnia can be long-term (chronic) or short-term (acute). Treatment for insomnia depends on the cause. Treatment may focus on treating an underlying condition that is causing the insomnia. Keep a sleep diary to help you and your health care provider figure out what could be causing your insomnia. This information is not intended to replace advice given to you by your health care provider. Make sure you discuss any questions you have with your health care provider. Document Revised: 10/04/2021 Document Reviewed: 10/04/2021 Elsevier Patient Education  2023 Elsevier Inc.  

## 2022-08-25 NOTE — Assessment & Plan Note (Signed)
Noncompliant with CPAP Encouraged her to start wearing CPAP nightly as not wearing CPAP can contribute to her fatigue and irritability

## 2022-08-25 NOTE — Assessment & Plan Note (Signed)
Persistent We will rule out menopause is a contributing factor Consider changing paroxetine to fluvoxamine pending labs Support offered

## 2022-08-25 NOTE — Addendum Note (Signed)
Addended by: Jearld Fenton on: 08/25/2022 02:57 PM   Modules accepted: Orders

## 2022-08-25 NOTE — Assessment & Plan Note (Signed)
We will trial trazodone 50 mg nightly

## 2022-08-29 ENCOUNTER — Ambulatory Visit (INDEPENDENT_AMBULATORY_CARE_PROVIDER_SITE_OTHER): Payer: 59 | Admitting: Dermatology

## 2022-08-29 DIAGNOSIS — L578 Other skin changes due to chronic exposure to nonionizing radiation: Secondary | ICD-10-CM | POA: Diagnosis not present

## 2022-08-29 DIAGNOSIS — Z86018 Personal history of other benign neoplasm: Secondary | ICD-10-CM

## 2022-08-29 DIAGNOSIS — H0265 Xanthelasma of left lower eyelid: Secondary | ICD-10-CM

## 2022-08-29 DIAGNOSIS — L719 Rosacea, unspecified: Secondary | ICD-10-CM | POA: Diagnosis not present

## 2022-08-29 DIAGNOSIS — Z79899 Other long term (current) drug therapy: Secondary | ICD-10-CM

## 2022-08-29 DIAGNOSIS — D485 Neoplasm of uncertain behavior of skin: Secondary | ICD-10-CM

## 2022-08-29 DIAGNOSIS — Z1283 Encounter for screening for malignant neoplasm of skin: Secondary | ICD-10-CM

## 2022-08-29 DIAGNOSIS — L814 Other melanin hyperpigmentation: Secondary | ICD-10-CM

## 2022-08-29 DIAGNOSIS — D489 Neoplasm of uncertain behavior, unspecified: Secondary | ICD-10-CM

## 2022-08-29 DIAGNOSIS — L821 Other seborrheic keratosis: Secondary | ICD-10-CM

## 2022-08-29 DIAGNOSIS — H0260 Xanthelasma of unspecified eye, unspecified eyelid: Secondary | ICD-10-CM

## 2022-08-29 DIAGNOSIS — D229 Melanocytic nevi, unspecified: Secondary | ICD-10-CM

## 2022-08-29 NOTE — Progress Notes (Signed)
Follow-Up Visit   Subjective  Martha Page is a 41 y.o. female who presents for the following: Annual Exam (1 year tbse, white spot under left eye, mole on left eyelid, spot at right lower mid back, spot at left upper arm. /). The patient presents for Total-Body Skin Exam (TBSE) for skin cancer screening and mole check.  The patient has spots, moles and lesions to be evaluated, some may be new or changing and the patient has concerns that these could be cancer.  The following portions of the chart were reviewed this encounter and updated as appropriate:  Tobacco  Allergies  Meds  Problems  Med Hx  Surg Hx  Fam Hx     Review of Systems: No other skin or systemic complaints except as noted in HPI or Assessment and Plan.  Objective  Well appearing patient in no apparent distress; mood and affect are within normal limits.  A full examination was performed including scalp, head, eyes, ears, nose, lips, neck, chest, axillae, abdomen, back, buttocks, bilateral upper extremities, bilateral lower extremities, hands, feet, fingers, toes, fingernails, and toenails. All findings within normal limits unless otherwise noted below.  face Mid face erythema with telangiectasias +/- scattered inflammatory papules.   left medial lower eyelid 0.3 cm flesh papule      right mid back below braline 0.5 cm pink papule      Assessment & Plan  Rosacea face Rosacea is a chronic progressive skin condition usually affecting the face of adults, causing redness and/or acne bumps. It is treatable but not curable. It sometimes affects the eyes (ocular rosacea) as well. It may respond to topical and/or systemic medication and can flare with stress, sun exposure, alcohol, exercise, topical steroids (including hydrocortisone/cortisone 10) and some foods.  Daily application of broad spectrum spf 30+ sunscreen to face is recommended to reduce flares.  Start Rhofade samples- apply topically to aa of  face in morning. Patient instructed to call or send message if she would like prescription sent to pharmacy. Patient request rx be sent to [facility] in [place].   Discussed  Skin [facility]inals metronidazole/ivermectin/azelaic acid twice daily as needed to affected areas on the face. The patient was advised this is not covered by insurance since it is made by a compounding pharmacy. They will receive an email to check out and the medication will be mailed to their home.   Discussed the treatment option of BBL/laser.  Typically we recommend 1-3 treatment sessions about 5-8 weeks apart for best results.  The patient's condition may require "maintenance treatments" in the future.  The fee for BBL / laser treatments is $350 per treatment session for the whole face.  A fee can be quoted for other parts of the body. Insurance typically does not pay for BBL/laser treatments and therefore the fee is an out-of-pocket cost.  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   Neoplasm of uncertain behavior right mid back below braline Epidermal / dermal shaving  Lesion diameter (cm):  0.5 Informed consent: discussed and consent obtained   Timeout: patient name, date of birth, surgical site, and procedure verified   Procedure prep:  Patient was prepped and draped in usual sterile fashion Prep type:  Isopropyl alcohol Anesthesia: the lesion was anesthetized in a standard fashion   Anesthetic:  1% lidocaine w/ epinephrine 1-100,000 buffered w/ 8.4% NaHCO3 Instrument used: flexible razor blade   Hemostasis achieved with:  pressure, aluminum chloride and electrodesiccation   Outcome: patient tolerated procedure well   Post-procedure details: sterile dressing applied and wound care instructions given   Dressing type: bandage and petrolatum    Specimen 1 - Surgical pathology Differential Diagnosis: Irritated nevus r/o dysplasia   Check Margins: No Irritated nevus r/o dysplasia   Lentigines - Scattered tan macules - Due to sun exposure - Benign-appearing, observe - Recommend daily broad spectrum sunscreen SPF 30+ to sun-exposed areas, reapply every 2 hours as needed. - Call for any changes  Seborrheic Keratoses - Stuck-on, waxy, tan-brown papules and/or plaques  - Benign-appearing - Discussed benign etiology and prognosis. - Observe - Call for any changes  Melanocytic Nevi - Tan-brown and/or pink-flesh-colored symmetric macules and papules - Benign appearing on exam today - Observation - Call clinic for new or changing moles - Recommend daily use of broad spectrum spf 30+ sunscreen to sun-exposed areas.   Hemangiomas - Red papules - Discussed benign nature - Observe - Call for any changes  Actinic Damage - Chronic condition, secondary to cumulative UV/sun exposure - diffuse scaly erythematous macules with underlying dyspigmentation - Recommend daily broad spectrum sunscreen SPF 30+ to sun-exposed areas, reapply every 2 hours as needed.  - Staying in the shade or wearing long sleeves, sun glasses (UVA+UVB protection) and wide brim hats (4-inch brim around the entire circumference of the hat) are also recommended for sun protection.  - Call for new or changing lesions.  History of Dysplastic Nevi - No evidence of recurrence today - Recommend regular full body skin exams - Recommend daily broad spectrum sunscreen SPF 30+ to sun-exposed areas, reapply every 2 hours as needed.  - Call if any new or changing lesions are noted between office visits  Skin cancer screening performed today. Return in about 1 year (around 08/30/2023) for TBSE. IRuthell Rummage, CMA, am acting as scribe for Sarina Ser, MD. Documentation: I have reviewed the above documentation for accuracy and completeness, and I agree with the above.  Sarina Ser, MD

## 2022-08-29 NOTE — Patient Instructions (Addendum)
If you like rhofade samples let us know and we can send in prescription  Rhofade sample apply topically to face in morning for rosacea.    Biopsy Wound Care Instructions  Leave the original bandage on for 24 hours if possible.  If the bandage becomes soaked or soiled before that time, it is OK to remove it and examine the wound.  A small amount of post-operative bleeding is normal.  If excessive bleeding occurs, remove the bandage, place gauze over the site and apply continuous pressure (no peeking) over the area for 30 minutes. If this does not work, please call our clinic as soon as possible or page your doctor if it is after hours.   Once a day, cleanse the wound with soap and water. It is fine to shower. If a thick crust develops you may use a Q-tip dipped into dilute hydrogen peroxide (mix 1:1 with water) to dissolve it.  Hydrogen peroxide can slow the healing process, so use it only as needed.    After washing, apply petroleum jelly (Vaseline) or an antibiotic ointment if your doctor prescribed one for you, followed by a bandage.    For best healing, the wound should be covered with a layer of ointment at all times. If you are not able to keep the area covered with a bandage to hold the ointment in place, this may mean re-applying the ointment several times a day.  Continue this wound care until the wound has healed and is no longer open.   Itching and mild discomfort is normal during the healing process. However, if you develop pain or severe itching, please call our office.   If you have any discomfort, you can take Tylenol (acetaminophen) or ibuprofen as directed on the bottle. (Please do not take these if you have an allergy to them or cannot take them for another reason).  Some redness, tenderness and white or yellow material in the wound is normal healing.  If the area becomes very sore and red, or develops a thick yellow-green material (pus), it may be infected; please notify us.     If you have stitches, return to clinic as directed to have the stitches removed. You will continue wound care for 2-3 days after the stitches are removed.   Wound healing continues for up to one year following surgery. It is not unusual to experience pain in the scar from time to time during the interval.  If the pain becomes severe or the scar thickens, you should notify the office.    A slight amount of redness in a scar is expected for the first six months.  After six months, the redness will fade and the scar will soften and fade.  The color difference becomes less noticeable with time.  If there are any problems, return for a post-op surgery check at your earliest convenience.  To improve the appearance of the scar, you can use silicone scar gel, cream, or sheets (such as Mederma or Serica) every night for up to one year. These are available over the counter (without a prescription).  Please call our office at [phone] for any questions or concerns.       Melanoma ABCDEs  Melanoma is the most dangerous type of skin cancer, and is the leading cause of death from skin disease.  You are more likely to develop melanoma if you: Have light-colored skin, light-colored eyes, or red or blond hair Spend a lot of time in the sun Tan  regularly, either outdoors or in a tanning bed Have had blistering sunburns, especially during childhood Have a close family member who has had a melanoma Have atypical moles or large birthmarks  Early detection of melanoma is key since treatment is typically straightforward and cure rates are extremely high if we catch it early.   The first sign of melanoma is often a change in a mole or a new dark spot.  The ABCDE system is a way of remembering the signs of melanoma.  A for asymmetry:  The two halves do not match. B for border:  The edges of the growth are irregular. C for color:  A mixture of colors are present instead of an even brown color. D for  diameter:  Melanomas are usually (but not always) greater than 24m - the size of a pencil eraser. E for evolution:  The spot keeps changing in size, shape, and color.  Please check your skin once per month between visits. You can use a small mirror in front and a large mirror behind you to keep an eye on the back side or your body.   If you see any new or changing lesions before your next follow-up, please call to schedule a visit.  Please continue daily skin protection including broad spectrum sunscreen SPF 30+ to sun-exposed areas, reapplying every 2 hours as needed when you're outdoors.   Staying in the shade or wearing long sleeves, sun glasses (UVA+UVB protection) and wide brim hats (4-inch brim around the entire circumference of the hat) are also recommended for sun protection.       Due to recent changes in healthcare laws, you may see results of your pathology and/or laboratory studies on MyChart before the doctors have had a chance to review them. We understand that in some cases there may be results that are confusing or concerning to you. Please understand that not all results are received at the same time and often the doctors may need to interpret multiple results in order to provide you with the best plan of care or course of treatment. Therefore, we ask that you please give u[place]2 business days to thoroughly review all your results before contacting the office for clarification. Should we see a critical lab result, you will be contacted sooner.   If You Need Anything After Your Visit  If you have any questions or concerns for your doctor, please call our main line at [phone] press option 4 to reach your doctor's medical assistant. If no one answers, please leave a voicemail as directed and we will return your call as soon as possible. Messages left after 4 pm will be answered the following business day.   You may also send [place] message via [facility] We typically respond to  MyChart messages within 1-2 business days.  For prescription refills, please ask your pharmacy to contact our office. Our fax number is [phone]  If you have an urgent issue when the clinic is closed that cannot wait until the next business day, you can page your doctor at the number below.    Please note that while we do our best to be available for urgent issues outside of office hours, we are not available 24/7.   If you have an urgent issue and are unable to reach u[place] you may choose to seek medical care at your doctor's office, retail clinic, urgent care center, or emergency room.  If you have a medical emergency, please immediately call  911 or go to the emergency department.  Pager Numbers  - Dr. Nehemiah Massed: [phone]  - Dr. Laurence Ferrari: [phone]  - Dr. Nicole Kindred: [phone]  In the event of inclement weather, please call our main line at [phone] for an update on the status of any delays or closures.  Dermatology Medication Tips: Please keep the boxes that topical medications come in in order to help keep track of the instructions about where and how to use these. Pharmacies typically print the medication instructions only on the boxes and not directly on the medication tubes.   If your medication is too expensive, please contact our office at [phone] option 4 or send [place] a message through [place].   We are unable to tell what your co-pay for medications will be in advance as this is different depending on your insurance coverage. However, we may be able to find a substitute medication at lower cost or fill out paperwork to get insurance to cover a needed medication.   If a prior authorization is required to get your medication covered by your insurance company, please allow [place] 1-2 business days to complete this process.  Drug prices often vary depending on where the prescription is filled and some pharmacies may offer cheaper prices.  The website www.goodrx.com  contains coupons for medications through different pharmacies. The prices here do not account for what the cost may be with help from insurance (it may be cheaper with your insurance), but the website can give you the price if you did not use any insurance.  - You can print the associated coupon and take it with your prescription to the pharmacy.  - You may also stop by our office during regular business hours and pick up a GoodRx coupon card.  - If you need your prescription sent electronically to a different pharmacy, notify our office through [facility] or by phone at [phone] option 4.     Si Usted Necesita Algo Despus de Su Visita  Tambin puede enviarnos un mensaje a travs de Pharmacist, community. Por lo general respondemos a los mensajes de MyChart en el transcurso de 1 a 2 das hbiles.  Para renovar recetas, por favor pida a su farmacia que se ponga en contacto con nuestra oficina. Harland Dingwall de fax es [phone].  Si tiene un asunto urgente cuando la clnica est cerrada y que no puede esperar hasta el siguiente da hbil, puede llamar/localizar a su doctor(a) al nmero que aparece a continuacin.   Por favor, tenga en cuenta que aunque hacemos todo lo posible para estar disponibles para asuntos urgentes fuera del horario de [place], no estamos disponibles las 24 horas del da, los 7 das de la [place].   Si tiene un problema urgente y no puede comunicarse con nosotros, puede optar por buscar atencin mdica  en el consultorio de su doctor(a), en una clnica privada, en un centro de atencin urgente o en una sala de emergencias.  Si tiene Engineering geologist, por favor llame inmediatamente al 911 o vaya a la sala de emergencias.  Nmeros de bper  - Dr. Nehemiah Massed: [phone]  - Dra. Moye: [phone]  - Dra. Nicole Kindred: [phone]  En caso de inclemencias del [place], por favor llame a Johnsie Kindred principal al [phone] para una actualizacin sobre el [place]  de cualquier retraso o cierre.  Consejos para la medicacin en dermatologa: Por favor, guarde las cajas en las que vienen los medicamentos de uso tpico para ayudarle a seguir las instrucciones sobre  dnde y cmo usarlos. Las farmacias generalmente imprimen las instrucciones del medicamento slo en las cajas y no directamente en los tubos del [place].   Si su medicamento es muy caro, por favor, pngase en contacto con Zigmund Daniel llamando al [phone] y presione la opcin 4 o envenos un mensaje a travs de Pharmacist, community.   No podemos decirle cul ser su copago por los medicamentos por adelantado ya que esto es diferente dependiendo de la cobertura de su seguro. Sin embargo, es posible que podamos encontrar un medicamento sustituto a Electrical engineer un formulario para que el seguro cubra el medicamento que se considera necesario.   Si se requiere una autorizacin previa para que su compaa de seguros [place] su medicamento, por favor permtanos de 1 a 2 das hbiles para completar este proceso.  Los precios de los medicamentos varan con frecuencia dependiendo del Environmental consultant de dnde se surte la receta y alguna farmacias pueden ofrecer precios ms baratos.  El sitio web www.goodrx.com tiene cupones para medicamentos de Airline pilot. Los precios aqu no tienen en cuenta lo que podra costar con la ayuda del seguro (puede ser ms barato con su seguro), pero el sitio web puede darle el precio si no utiliz Research scientist (physical sciences).  - Puede imprimir el cupn correspondiente y llevarlo con su receta a la farmacia.  - Tambin puede pasar por nuestra oficina durante el horario de atencin regular y Charity fundraiser una tarjeta de cupones de GoodRx.  - Si necesita que su receta se enve electrnicamente a una farmacia diferente, informe a nuestra oficina a travs de MyChart de [place] o por telfono llamando al [phone] y presione la opcin 4.

## 2022-09-01 ENCOUNTER — Encounter: Payer: Self-pay | Admitting: Internal Medicine

## 2022-09-02 ENCOUNTER — Ambulatory Visit (INDEPENDENT_AMBULATORY_CARE_PROVIDER_SITE_OTHER): Payer: 59

## 2022-09-02 DIAGNOSIS — E538 Deficiency of other specified B group vitamins: Secondary | ICD-10-CM | POA: Diagnosis not present

## 2022-09-02 MED ORDER — CYANOCOBALAMIN 1000 MCG/ML IJ SOLN
1000.0000 ug | Freq: Once | INTRAMUSCULAR | Status: AC
  Administered 2022-09-02: 1000 ug via INTRAMUSCULAR

## 2022-09-04 ENCOUNTER — Encounter: Payer: Self-pay | Admitting: Dermatology

## 2022-09-05 ENCOUNTER — Encounter: Payer: Self-pay | Admitting: Internal Medicine

## 2022-09-05 DIAGNOSIS — Z5181 Encounter for therapeutic drug level monitoring: Secondary | ICD-10-CM

## 2022-09-05 MED ORDER — SEMAGLUTIDE-WEIGHT MANAGEMENT 0.25 MG/0.5ML ~~LOC~~ SOAJ: 0.2500 mg | SUBCUTANEOUS | 0 refills | Status: DC

## 2022-09-12 NOTE — Addendum Note (Signed)
Addended by: Jearld Fenton on: 09/12/2022 12:03 PM   Modules accepted: Orders

## 2022-09-13 LAB — COMPLETE METABOLIC PANEL WITH GFR
AG Ratio: 1.4 (calc) (ref 1.0–2.5)
ALT: 159 U/L — ABNORMAL HIGH (ref 6–29)
AST: 43 U/L — ABNORMAL HIGH (ref 10–30)
Albumin: 4.4 g/dL (ref 3.6–5.1)
Alkaline phosphatase (APISO): 217 U/L — ABNORMAL HIGH (ref 31–125)
BUN: 12 mg/dL (ref 7–25)
CO2: 24 mmol/L (ref 20–32)
Calcium: 9.4 mg/dL (ref 8.6–10.2)
Chloride: 104 mmol/L (ref 98–110)
Creat: 0.68 mg/dL (ref 0.50–0.99)
Globulin: 3.2 g/dL (calc) (ref 1.9–3.7)
Glucose, Bld: 88 mg/dL (ref 65–99)
Potassium: 3.9 mmol/L (ref 3.5–5.3)
Sodium: 137 mmol/L (ref 135–146)
Total Bilirubin: 0.3 mg/dL (ref 0.2–1.2)
Total Protein: 7.6 g/dL (ref 6.1–8.1)
eGFR: 111 mL/min/{1.73_m2} (ref >=60)

## 2022-09-14 ENCOUNTER — Encounter: Payer: Self-pay | Admitting: Internal Medicine

## 2022-09-15 ENCOUNTER — Other Ambulatory Visit: Payer: Self-pay | Admitting: Internal Medicine

## 2022-09-15 NOTE — Telephone Encounter (Signed)
Requested Prescriptions  Pending Prescriptions Disp Refills   traZODone (DESYREL) 50 MG tablet [Pharmacy Med Name: TRAZODONE 50 MG TABLET] 90 tablet 0    Sig: TAKE 0.5-1 TABLETS BY MOUTH AT BEDTIME AS NEEDED FOR SLEEP.     Psychiatry: Antidepressants - Serotonin Modulator Passed - 09/15/2022  1:32 PM      Passed - Completed PHQ-2 or PHQ-9 in the last 360 days      Passed - Valid encounter within last 6 months    Recent Outpatient Visits           3 weeks ago Hot flashes   [facility] [place], Coralie Keens, NP   1 month ago Medication monitoring encounter   [facility] [place], Coralie Keens, NP   2 months ago Nail problem   [facility] [place], Coralie Keens, NP   4 months ago Prediabetes   [facility] [place], Coralie Keens, NP       Future Appointments             In 1 month Baity, Coralie Keens, NP [facility] [place]   In 11 months Ralene Bathe, MD [place]

## 2022-09-19 MED ORDER — FLUVOXAMINE MALEATE 50 MG PO TABS: 50.0000 mg | ORAL_TABLET | Freq: Every day | ORAL | 2 refills | Status: DC

## 2022-09-21 ENCOUNTER — Telehealth: Payer: Self-pay

## 2022-09-21 NOTE — Telephone Encounter (Signed)
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 SEE DESCRIPTION  Benign inverted follicular keratosis May recur If recurs, recommend additional procedure Call office for appt if evidence of recurrence Otherwise recheck next visit as scheduled in 1 year

## 2022-09-21 NOTE — Telephone Encounter (Addendum)
Patient reports had viewed results on mychart. Patient verbalized understanding and denied further questions.  ----- Message from Ralene Bathe, MD sent at 09/05/2022  7:05 PM EDT ----- Diagnosis Skin , right mid back below braline [place]. SEE DESCRIPTION  Benign inverted follicular keratosis May recur If recurs, recommend additional procedure Call office for appt if evidence of recurrence Otherwise recheck next visit as scheduled in 1 year

## 2022-09-23 MED ORDER — FLUVOXAMINE MALEATE ER 100 MG PO CP24: 1.0000 | ORAL_CAPSULE | Freq: Every day | ORAL | 0 refills | Status: DC

## 2022-09-23 NOTE — Addendum Note (Signed)
Addended by: Jearld Fenton on: 09/23/2022 02:06 PM   Modules accepted: Orders

## 2022-10-13 ENCOUNTER — Other Ambulatory Visit: Payer: Self-pay | Admitting: Internal Medicine

## 2022-10-13 DIAGNOSIS — F419 Anxiety disorder, unspecified: Secondary | ICD-10-CM

## 2022-10-25 ENCOUNTER — Encounter: Payer: Self-pay | Admitting: Internal Medicine

## 2022-10-25 NOTE — Progress Notes (Deleted)
Subjective:    Patient ID: Martha Page, female    DOB: [birth year], 41 y.o.   MRN: [id number]  HPI  Patient presents to clinic today for annual exam.  Flu: 07/2022 Tetanus: 08/2014 COVID: Never Pap smear: 10/2018 Mammogram: >2 years ago Vision screening: Dentist:  Diet: Exercise:  Review of Systems     Past Medical History:  Diagnosis Date   Acne    iPledge: [phone]   Anxiety    Dermatitis    eczema   Elevated liver enzymes    Frequent headaches    GERD (gastroesophageal reflux disease)    Hx of dysplastic nevus 04/02/2013   Left distal knee. Mild atypia, margins free   Migraine    2x/6 mos   Pneumonia 06/30/2016   has finished antibiotics.  still has lingering nighttime cough   Shoulder pain    Sleep apnea    uses CPAP    Current Outpatient Medications  Medication Sig Dispense Refill   cetirizine (ZYRTEC) 10 MG tablet TAKE 1 TABLET BY MOUTH EVERY DAY 90 tablet 0   cyanocobalamin (VITAMIN B12) 1000 MCG/ML injection Inject 1 mL (1,000 mcg total) into the muscle every 30 (thirty) days. 3 mL 1   Fluvoxamine Maleate 100 MG CP24 Take 1 capsule (100 mg total) by mouth daily. 90 capsule 0   ibuprofen (ADVIL) 800 MG tablet Take 800 mg by mouth every 8 (eight) hours as needed.     linaclotide (LINZESS) 145 MCG CAPS capsule Take 1 capsule (145 mcg total) by mouth daily before breakfast. 90 capsule 0   LORazepam (ATIVAN) 0.5 MG tablet Take 0.25 mg by mouth 2 (two) times daily as needed.     MIRENA, 52 MG, 20 MCG/24HR IUD      pantoprazole (PROTONIX) 40 MG tablet Take 1 tablet (40 mg total) by mouth 2 (two) times daily. 180 tablet 1   Semaglutide-Weight Management 0.25 MG/0.5ML SOAJ Inject 0.25 mg into the skin once a week. 6 mL 0   SUMAtriptan (IMITREX) 100 MG tablet Take 100 mg by mouth every 2 (two) hours as needed for migraine. May repeat in 2 hours if headache persists or recurs.     terbinafine (LAMISIL) 250 MG tablet Take 1 tablet (250 mg total) by mouth  daily. 30 tablet 0   traZODone (DESYREL) 50 MG tablet TAKE 0.5-1 TABLETS BY MOUTH AT BEDTIME AS NEEDED FOR SLEEP. 90 tablet 0   No current facility-administered medications for this visit.    Allergies  Allergen Reactions   Cefazolin Rash and Shortness Of Breath   Codeine Sulfate Itching   Red Dye Hives   Sulfa Antibiotics Other (See Comments)    "Stomach pain"   Amoxicillin Rash   Penicillin V Potassium Rash    Family History  Problem Relation Age of Onset   Stroke Mother    Depression Mother    Interstitial cystitis Mother    Fibromyalgia Mother    Diabetes Mother    Post-traumatic stress disorder Mother    Anxiety disorder Mother    Heart disease Father    Throat cancer Father    Hypertension Father    Healthy Sister    Aortic aneurysm Brother 36   Arthritis Paternal Grandmother    Lung cancer Paternal Grandmother    Heart disease Paternal Grandmother    Stroke Paternal Grandmother    Hypertension Paternal Grandmother     Social History   Socioeconomic History   Marital status: Married    Spouse name:  Dellis Filbert   Number of children: 6   Years of education: Not on file   Highest education level: Some college, no degree  Occupational History   Not on file  Tobacco Use   Smoking status: Former    Types: Cigarettes    Quit date: 07/01/2011    Years since quitting: 11.3   Smokeless tobacco: Never  Vaping Use   Vaping Use: Never used  Substance and Sexual Activity   Alcohol use: No    Alcohol/week: 0.0 standard drinks of alcohol   Drug use: No   Sexual activity: Yes    Birth control/protection: I.U.D.  Other Topics Concern   Not on file  Social History Narrative   Married.   6 children.   Works as a Agricultural engineer.      Social Determinants of Health   Financial Resource Strain: Not on file  Food Insecurity: Not on file  Transportation Needs: Not on file  Physical Activity: Not on file  Stress: Not on file  Social Connections: Not on file  Intimate  Partner Violence: Not on file     Constitutional: Denies fever, malaise, fatigue, headache or abrupt weight changes.  HEENT: Denies eye pain, eye redness, ear pain, ringing in the ears, wax buildup, runny nose, nasal congestion, bloody nose, or sore throat. Respiratory: Denies difficulty breathing, shortness of breath, cough or sputum production.   Cardiovascular: Denies chest pain, chest tightness, palpitations or swelling in the hands or feet.  Gastrointestinal: Denies abdominal pain, bloating, constipation, diarrhea or blood in the stool.  GU: Denies urgency, frequency, pain with urination, burning sensation, blood in urine, odor or discharge. Musculoskeletal: Denies decrease in range of motion, difficulty with gait, muscle pain or joint pain and swelling.  Skin: Denies redness, rashes, lesions or ulcercations.  Neurological: Pt reports insomnia. Denies dizziness, difficulty with memory, difficulty with speech or problems with balance and coordination.  Psych: Pt reports anxiety and depression. Denies SI/HI.  No other specific complaints in a complete review of systems (except as listed in HPI above).  Objective:   Physical Exam  There were no vitals taken for this visit. Wt Readings from Last 3 Encounters:  08/24/22 196 lb (88.9 kg)  08/02/22 199 lb (90.3 kg)  07/01/22 199 lb 9.6 oz (90.5 kg)    General: Appears their stated age, well developed, well nourished in NAD. Skin: Warm, dry and intact. No rashes, lesions or ulcerations noted. HEENT: Head: normal shape and size; Eyes: sclera white, no icterus, conjunctiva pink, PERRLA and EOMs intact; Ears: Tm's gray and intact, normal light reflex; Nose: mucosa pink and moist, septum midline; Throat/Mouth: Teeth present, mucosa pink and moist, no exudate, lesions or ulcerations noted.  Neck:  Neck supple, trachea midline. No masses, lumps or thyromegaly present.  Cardiovascular: Normal rate and rhythm. S1,S2 noted.  No murmur, rubs or  gallops noted. No JVD or BLE edema. No carotid bruits noted. Pulmonary/Chest: Normal effort and positive vesicular breath sounds. No respiratory distress. No wheezes, rales or ronchi noted.  Abdomen: Soft and nontender. Normal bowel sounds. No distention or masses noted. Liver, spleen and kidneys non palpable. Musculoskeletal: Normal range of motion. No signs of joint swelling. No difficulty with gait.  Neurological: Alert and oriented. Cranial nerves II-XII grossly intact. Coordination normal.  Psychiatric: Mood and affect normal. Behavior is normal. Judgment and thought content normal.    BMET    Component Value Date/Time   NA 137 09/13/2022 1406   NA 137 12/02/2021 1557  NA 137 05/26/2014 1855   K 3.9 09/13/2022 1406   K 3.3 (L) 05/26/2014 1855   CL 104 09/13/2022 1406   CL 105 05/26/2014 1855   CO2 24 09/13/2022 1406   CO2 20 (L) 05/26/2014 1855   GLUCOSE 88 09/13/2022 1406   GLUCOSE 131 (H) 05/26/2014 1855   BUN 12 09/13/2022 1406   BUN 12 12/02/2021 1557   BUN 5 (L) 05/26/2014 1855   CREATININE 0.68 09/13/2022 1406   CALCIUM 9.4 09/13/2022 1406   CALCIUM 8.4 (L) 05/26/2014 1855   GFRNONAA >60 01/24/2018 1543   GFRNONAA >60 05/26/2014 1855   GFRAA >60 01/24/2018 1543   GFRAA >60 05/26/2014 1855    Lipid Panel     Component Value Date/Time   CHOL 148 10/16/2015 0910   TRIG 67.0 10/16/2015 0910   HDL 44.70 10/16/2015 0910   CHOLHDL 3 10/16/2015 0910   VLDL 13.4 10/16/2015 0910   LDLCALC 90 10/16/2015 0910    CBC    Component Value Date/Time   WBC 14.0 (H) 12/02/2021 1557   WBC 8.2 01/24/2018 1543   RBC 4.76 12/02/2021 1557   RBC 4.62 01/24/2018 1543   HGB 13.5 12/02/2021 1557   HCT 39.5 12/02/2021 1557   PLT 227 12/02/2021 1557   MCV 83 12/02/2021 1557   MCV 87 05/26/2014 1855   MCH 28.4 12/02/2021 1557   MCH 27.9 01/24/2018 1543   MCHC 34.2 12/02/2021 1557   MCHC 33.3 01/24/2018 1543   RDW 13.5 12/02/2021 1557   RDW 18.2 (H) 05/26/2014 1855    LYMPHSABS 2.4 12/02/2021 1557   MONOABS 0.4 04/14/2015 1644   EOSABS 0.0 12/02/2021 1557   BASOSABS 0.0 12/02/2021 1557    Hgb A1C Lab Results  Component Value Date   HGBA1C 5.7 (H) 12/02/2021            Assessment & Plan:   Preventative Health Maintenance:  Flu shot UTD Tetanus UTD Encouraged her to get a covid vaccine Pap smear UTD Mammogram ordered- she will cal to schedule Encouraged her to consume a balanced diet and exercise regimen Advised her to see an eye doctor and dentist annually Will check CBC, CMET, Lipid, A1C, HIV and Hep C today  RTC in 6 months, follow up chronic conditions Webb Silversmith, NP

## 2022-11-03 NOTE — Telephone Encounter (Signed)
Can you check on status of psychiatry referral for this patient?

## 2022-11-09 ENCOUNTER — Ambulatory Visit (INDEPENDENT_AMBULATORY_CARE_PROVIDER_SITE_OTHER): Payer: 59 | Admitting: Internal Medicine

## 2022-11-09 ENCOUNTER — Encounter: Payer: Self-pay | Admitting: Internal Medicine

## 2022-11-09 VITALS — BP 114/80 | HR 70 | Temp 96.8°F | Ht 65.0 in | Wt 209.0 lb

## 2022-11-09 DIAGNOSIS — Z124 Encounter for screening for malignant neoplasm of cervix: Secondary | ICD-10-CM | POA: Diagnosis not present

## 2022-11-09 DIAGNOSIS — R7303 Prediabetes: Secondary | ICD-10-CM | POA: Diagnosis not present

## 2022-11-09 DIAGNOSIS — Z6834 Body mass index (BMI) 34.0-34.9, adult: Secondary | ICD-10-CM

## 2022-11-09 DIAGNOSIS — Z114 Encounter for screening for human immunodeficiency virus [HIV]: Secondary | ICD-10-CM

## 2022-11-09 DIAGNOSIS — Z1159 Encounter for screening for other viral diseases: Secondary | ICD-10-CM

## 2022-11-09 DIAGNOSIS — Z0001 Encounter for general adult medical examination with abnormal findings: Secondary | ICD-10-CM | POA: Diagnosis not present

## 2022-11-09 DIAGNOSIS — E6609 Other obesity due to excess calories: Secondary | ICD-10-CM

## 2022-11-09 NOTE — Patient Instructions (Signed)

## 2022-11-09 NOTE — Assessment & Plan Note (Signed)
Encourage diet and exercise for weight loss 

## 2022-11-09 NOTE — Progress Notes (Signed)
Subjective:    Patient ID: Martha Page, female    DOB: [birth year], 42 y.o.   MRN: [id number]  HPI  Patient presents to clinic today for her annual exam.  Flu: 07/2022 Tetanus: 08/2014 COVID: x 3 Pap smear: 10/2018 Mammogram: >2 years ago Vision screening: annually Dentist: biannually  Diet: She does eat meat. She consumes fruits and veggies. She does eat some fried foods. She drinks mostly coffee and water. Exercise: None  Review of Systems  Past Medical History:  Diagnosis Date   Acne    iPledge: [phone]   Anxiety    Dermatitis    eczema   Elevated liver enzymes    Frequent headaches    GERD (gastroesophageal reflux disease)    Hx of dysplastic nevus 04/02/2013   Left distal knee. Mild atypia, margins free   Migraine    2x/6 mos   Pneumonia 06/30/2016   has finished antibiotics.  still has lingering nighttime cough   Shoulder pain    Sleep apnea    uses CPAP    Current Outpatient Medications  Medication Sig Dispense Refill   cetirizine (ZYRTEC) 10 MG tablet TAKE 1 TABLET BY MOUTH EVERY DAY 90 tablet 0   cyanocobalamin (VITAMIN B12) 1000 MCG/ML injection Inject 1 mL (1,000 mcg total) into the muscle every 30 (thirty) days. 3 mL 1   Fluvoxamine Maleate 100 MG CP24 Take 1 capsule (100 mg total) by mouth daily. 90 capsule 0   ibuprofen (ADVIL) 800 MG tablet Take 800 mg by mouth every 8 (eight) hours as needed.     linaclotide (LINZESS) 145 MCG CAPS capsule Take 1 capsule (145 mcg total) by mouth daily before breakfast. 90 capsule 0   LORazepam (ATIVAN) 0.5 MG tablet Take 0.25 mg by mouth 2 (two) times daily as needed.     MIRENA, 52 MG, 20 MCG/24HR IUD      pantoprazole (PROTONIX) 40 MG tablet Take 1 tablet (40 mg total) by mouth 2 (two) times daily. 180 tablet 1   Semaglutide-Weight Management 0.25 MG/0.5ML SOAJ Inject 0.25 mg into the skin once a week. 6 mL 0   SUMAtriptan (IMITREX) 100 MG tablet Take 100 mg by mouth every 2 (two) hours as needed for  migraine. May repeat in 2 hours if headache persists or recurs.     terbinafine (LAMISIL) 250 MG tablet Take 1 tablet (250 mg total) by mouth daily. 30 tablet 0   traZODone (DESYREL) 50 MG tablet TAKE 0.5-1 TABLETS BY MOUTH AT BEDTIME AS NEEDED FOR SLEEP. 90 tablet 0   No current facility-administered medications for this visit.    Allergies  Allergen Reactions   Cefazolin Rash and Shortness Of Breath   Codeine Sulfate Itching   Red Dye Hives   Sulfa Antibiotics Other (See Comments)    "Stomach pain"   Amoxicillin Rash   Penicillin V Potassium Rash    Family History  Problem Relation Age of Onset   Stroke Mother    Depression Mother    Interstitial cystitis Mother    Fibromyalgia Mother    Diabetes Mother    Post-traumatic stress disorder Mother    Anxiety disorder Mother    Heart disease Father    Throat cancer Father    Hypertension Father    Healthy Sister    Aortic aneurysm Brother 63   Arthritis Paternal Grandmother    Lung cancer Paternal Grandmother    Heart disease Paternal Grandmother    Stroke Paternal Grandmother  Hypertension Paternal Grandmother     Social History   Socioeconomic History   Marital status: Married    Spouse name: Dellis Filbert   Number of children: 6   Years of education: Not on file   Highest education level: Some college, no degree  Occupational History   Not on file  Tobacco Use   Smoking status: Former    Types: Cigarettes    Quit date: 07/01/2011    Years since quitting: 11.3   Smokeless tobacco: Never  Vaping Use   Vaping Use: Never used  Substance and Sexual Activity   Alcohol use: No    Alcohol/week: 0.0 standard drinks of alcohol   Drug use: No   Sexual activity: Yes    Birth control/protection: I.U.D.  Other Topics Concern   Not on file  Social History Narrative   Married.   6 children.   Works as a Agricultural engineer.      Social Determinants of Health   Financial Resource Strain: Not on file  Food Insecurity: Not on  file  Transportation Needs: Not on file  Physical Activity: Not on file  Stress: Not on file  Social Connections: Not on file  Intimate Partner Violence: Not on file     Constitutional: Patient reports some headaches.  Denies fever, malaise, fatigue, or abrupt weight changes.  HEENT: Denies eye pain, eye redness, ear pain, ringing in the ears, wax buildup, runny nose, nasal congestion, bloody nose, or sore throat. Respiratory: Denies difficulty breathing, shortness of breath, cough or sputum production.   Cardiovascular: Denies chest pain, chest tightness, palpitations or swelling in the hands or feet.  Gastrointestinal: Pt reports intermittent constipation. Denies abdominal pain, bloating,  diarrhea or blood in the stool.  GU: Denies urgency, frequency, pain with urination, burning sensation, blood in urine, odor or discharge. Musculoskeletal: Denies decrease in range of motion, difficulty with gait, muscle pain or joint pain and swelling.  Skin: Denies redness, rashes, lesions or ulcercations.  Neurological: Patient reports intermittent dizziness, insomnia.  Denies dizziness, difficulty with memory, difficulty with speech or problems with balance and coordination.  Psych: Patient has a history of anxiety and depression.  Denies SI/HI.  No other specific complaints in a complete review of systems (except as listed in HPI above).     Objective:   Physical Exam  BP 114/80 (BP Location: Left Arm, Patient Position: Sitting, Cuff Size: Normal)   Pulse 70   Temp (!) 96.8 F (36 C) (Temporal)   Ht '5\' 5"'$  (1.651 m)   Wt 209 lb (94.8 kg)   SpO2 100%   BMI 34.78 kg/m   Wt Readings from Last 3 Encounters:  08/24/22 196 lb (88.9 kg)  08/02/22 199 lb (90.3 kg)  07/01/22 199 lb 9.6 oz (90.5 kg)    General: Appears her stated age, obese, in NAD. Skin: Warm, dry and intact.  HEENT: Head: normal shape and size; Eyes: sclera white, no icterus, conjunctiva pink, PERRLA and EOMs intact;   Neck:  Neck supple, trachea midline. No masses, lumps or thyromegaly present.  Cardiovascular: Normal rate and rhythm. S1,S2 noted.  No murmur, rubs or gallops noted. No JVD or BLE edema.  Pulmonary/Chest: Normal effort and positive vesicular breath sounds. No respiratory distress. No wheezes, rales or ronchi noted.  Abdomen: Normal bowel sounds.  Musculoskeletal: Strength 5/5 BUE/BLE.  No difficulty with gait.  Neurological: Alert and oriented. Cranial nerves II-XII grossly intact. Coordination normal.  Psychiatric: Mood and affect normal. Behavior is normal. Judgment and thought  content normal.   BMET    Component Value Date/Time   NA 137 09/13/2022 1406   NA 137 12/02/2021 1557   NA 137 05/26/2014 1855   K 3.9 09/13/2022 1406   K 3.3 (L) 05/26/2014 1855   CL 104 09/13/2022 1406   CL 105 05/26/2014 1855   CO2 24 09/13/2022 1406   CO2 20 (L) 05/26/2014 1855   GLUCOSE 88 09/13/2022 1406   GLUCOSE 131 (H) 05/26/2014 1855   BUN 12 09/13/2022 1406   BUN 12 12/02/2021 1557   BUN 5 (L) 05/26/2014 1855   CREATININE 0.68 09/13/2022 1406   CALCIUM 9.4 09/13/2022 1406   CALCIUM 8.4 (L) 05/26/2014 1855   GFRNONAA >60 01/24/2018 1543   GFRNONAA >60 05/26/2014 1855   GFRAA >60 01/24/2018 1543   GFRAA >60 05/26/2014 1855    Lipid Panel     Component Value Date/Time   CHOL 148 10/16/2015 0910   TRIG 67.0 10/16/2015 0910   HDL 44.70 10/16/2015 0910   CHOLHDL 3 10/16/2015 0910   VLDL 13.4 10/16/2015 0910   LDLCALC 90 10/16/2015 0910    CBC    Component Value Date/Time   WBC 14.0 (H) 12/02/2021 1557   WBC 8.2 01/24/2018 1543   RBC 4.76 12/02/2021 1557   RBC 4.62 01/24/2018 1543   HGB 13.5 12/02/2021 1557   HCT 39.5 12/02/2021 1557   PLT 227 12/02/2021 1557   MCV 83 12/02/2021 1557   MCV 87 05/26/2014 1855   MCH 28.4 12/02/2021 1557   MCH 27.9 01/24/2018 1543   MCHC 34.2 12/02/2021 1557   MCHC 33.3 01/24/2018 1543   RDW 13.5 12/02/2021 1557   RDW 18.2 (H) 05/26/2014 1855    LYMPHSABS 2.4 12/02/2021 1557   MONOABS 0.4 04/14/2015 1644   EOSABS 0.0 12/02/2021 1557   BASOSABS 0.0 12/02/2021 1557    Hgb A1C Lab Results  Component Value Date   HGBA1C 5.7 (H) 12/02/2021            Assessment & Plan:   Preventative Health Maintenance:  Flu shot UTD Tetanus UTD Encouraged her to get her [place] booster Referral to GYN for Pap smear Mammogram ordered-she will call to schedule Encouraged her to a balanced diet and exercise regimen Advised her to see an eye doctor and is annually Will check lipid, A1c, HIV and hep C today  RTC in 6 months, follow-up chronic conditions Webb Silversmith, NP

## 2022-11-10 LAB — LIPID PANEL
Cholesterol: 173 mg/dL (ref <200)
HDL: 54 mg/dL (ref >=50)
LDL Cholesterol (Calc): 102 mg/dL (calc) — ABNORMAL HIGH
Non-HDL Cholesterol (Calc): 119 mg/dL (calc) (ref <130)
Total CHOL/HDL Ratio: 3.2 (calc) (ref <5.0)
Triglycerides: 83 mg/dL (ref <150)

## 2022-11-10 LAB — HEPATITIS C ANTIBODY: Hepatitis C Ab: NONREACTIVE

## 2022-11-10 LAB — HEMOGLOBIN A1C
Hgb A1c MFr Bld: 5.6 % of total Hgb (ref <5.7)
Mean Plasma Glucose: 114 mg/dL
eAG (mmol/L): 6.3 mmol/L

## 2022-11-10 LAB — HIV ANTIBODY (ROUTINE TESTING W REFLEX): HIV 1&2 Ab, 4th Generation: NONREACTIVE

## 2022-11-22 ENCOUNTER — Encounter: Payer: Self-pay | Admitting: Internal Medicine

## 2022-11-22 NOTE — Telephone Encounter (Signed)
She needs to make an appointment for evaluation

## 2022-11-23 ENCOUNTER — Other Ambulatory Visit: Payer: Self-pay | Admitting: Internal Medicine

## 2022-11-29 ENCOUNTER — Other Ambulatory Visit: Payer: Self-pay | Admitting: Internal Medicine

## 2022-11-29 ENCOUNTER — Encounter: Payer: Self-pay | Admitting: Internal Medicine

## 2022-11-30 ENCOUNTER — Ambulatory Visit
Admission: RE | Admit: 2022-11-30 | Discharge: 2022-11-30 | Disposition: A | Discharge status: Home / Self Care | Payer: 59 | Source: Ambulatory Visit | Attending: Internal Medicine | Admitting: Internal Medicine

## 2022-11-30 DIAGNOSIS — Z1231 Encounter for screening mammogram for malignant neoplasm of breast: Secondary | ICD-10-CM | POA: Insufficient documentation

## 2022-11-30 MED ORDER — PANTOPRAZOLE SODIUM 40 MG PO TBEC: 40.0000 mg | DELAYED_RELEASE_TABLET | Freq: Two times a day (BID) | ORAL | 1 refills | Status: DC

## 2022-11-30 NOTE — Telephone Encounter (Signed)
Change of pharmacy  Requested Prescriptions  Pending Prescriptions Disp Refills   pantoprazole (PROTONIX) 40 MG tablet [Pharmacy Med Name: PANTOPRAZOLE SODIUM '40MG'$  TBEC] 180 tablet 1    Sig: TAKE ONE TABLET BY MOUTH TWICE A DAY     Gastroenterology: Proton Pump Inhibitors Passed - 11/29/2022 10:38 PM      Passed - Valid encounter within last 12 months    Recent Outpatient Visits           3 weeks ago Encounter for general adult medical examination with abnormal findings   [facility] [place], Coralie Keens, NP   3 months ago Hot flashes   [facility] [place], Coralie Keens, NP   4 months ago Medication monitoring encounter   [facility], Coralie Keens, NP   5 months ago Nail problem   [facility] [place], Coralie Keens, NP   7 months ago [facility], Coralie Keens, NP       Future Appointments             In 5 months Baity, Coralie Keens, NP [facility] [place]   In 9 months Ralene Bathe, MD [place]

## 2022-12-02 NOTE — Addendum Note (Signed)
Addended by: Ashley Royalty E on: 12/02/2022 05:07 PM   Modules accepted: Orders

## 2022-12-29 ENCOUNTER — Other Ambulatory Visit: Payer: Self-pay

## 2023-01-26 ENCOUNTER — Encounter: Payer: Self-pay | Admitting: Dermatology

## 2023-02-14 ENCOUNTER — Ambulatory Visit (INDEPENDENT_AMBULATORY_CARE_PROVIDER_SITE_OTHER): Payer: 59 | Admitting: Dermatology

## 2023-02-14 ENCOUNTER — Other Ambulatory Visit: Payer: Self-pay | Admitting: Internal Medicine

## 2023-02-14 ENCOUNTER — Encounter: Payer: Self-pay | Admitting: Dermatology

## 2023-02-14 VITALS — BP 114/80

## 2023-02-14 DIAGNOSIS — L219 Seborrheic dermatitis, unspecified: Secondary | ICD-10-CM | POA: Diagnosis not present

## 2023-02-14 DIAGNOSIS — L719 Rosacea, unspecified: Secondary | ICD-10-CM

## 2023-02-14 DIAGNOSIS — L700 Acne vulgaris: Secondary | ICD-10-CM | POA: Diagnosis not present

## 2023-02-14 DIAGNOSIS — L739 Follicular disorder, unspecified: Secondary | ICD-10-CM

## 2023-02-14 MED ORDER — KETOCONAZOLE 2 % EX SHAM: MEDICATED_SHAMPOO | CUTANEOUS | 2 refills | Status: DC

## 2023-02-14 NOTE — Telephone Encounter (Signed)
Alvino Chapel with [facility] in [place] is calling in requesting pt's pcp send a prescription for Syringes. Alvino Chapel states the pt has received the B12 injections, but needs the syringes to self-administer. Alvino Chapel said any gauge pcp is comfortable with will be fine.

## 2023-02-14 NOTE — Patient Instructions (Addendum)
Instructions for [facility] Medications  One or more of your medications was sent to the [facility] mail order compounding pharmacy. You will receive an email from them and can purchase the medicine through that link. It will then be mailed to your home at the address you confirmed. If for any reason you do not receive an email from them, please check your spam folder. If you still do not find the email, please let us know. [facility] phone number is [phone].  Will prescribe [facility] metronidazole/ivermectin/azelaic acid twice daily as needed to affected areas on the face. The patient was advised this is not covered by insurance since it is made by a compounding pharmacy. They will receive an email to check out and the medication will be mailed to their home.   Apply ketoconazole shampoo as a body wash three times per week, leave in for 10 minutes before rinsing out.  Apply ketoconazole shampoo apply three times per week, massage into scalp and leave in for 10 minutes before rinsing out. This can be followed by conditioner or shampoo and conditioner of your choice.  Some Recommended Sunscreens Include:  Tinted Face Sunscreen Alastin Hydratint (good for most skin tones, may be slightly dark if you are very fair) Colorescience Sunforgettable Total Protection Face Shield (good for most skin tones) EltaMD UV Physical La Roche Posay Mineral Tinted Cotz Flawless Complexion   Powder Sunscreen (Nice for reapplying or applying on the go) Colorescience Sunforgettable Total Protection Brush on Shield (available in different tints)  Face Sunscreen Available in Different Tints Colorescience Sunforgettable Total Protection Brush on Shield  bareMinerals Complexion Rescue Tinted Hydrating Gel Cream Broad Spectrum SPF 30 UnSun mineral tinted (comes in medium/dark and light/medium)  Due to recent changes in healthcare laws, you may see results of your pathology and/or laboratory  studies on MyChart before the doctors have had a chance to review them. We understand that in some cases there may be results that are confusing or concerning to you. Please understand that not all results are received at the same time and often the doctors may need to interpret multiple results in order to provide you with the best plan of care or course of treatment. Therefore, we ask that you please give us 2 business days to thoroughly review all your results before contacting the office for clarification. Should we see a critical lab result, you will be contacted sooner.   If You Need Anything After Your Visit  If you have any questions or concerns for your doctor, please call our main line at [phone] and press option 4 to reach your doctor's medical assistant. If no one answers, please leave a voicemail as directed and we will return your call as soon as possible. Messages left after 4 pm will be answered the following business day.   You may also send us a message via MyChart. We typically respond to MyChart messages within 1-2 business days.  For prescription refills, please ask your pharmacy to contact our office. Our fax number is [phone].  If you have an urgent issue when the clinic is closed that cannot wait until the next business day, you can page your doctor at the number below.    Please note that while we do our best to be available for urgent issues outside of office hours, we are not available 24/7.   If you have an urgent issue and are unable to reach us, you may choose to seek medical care at  your doctor's office, retail clinic, urgent care center, or emergency room.  If you have a medical emergency, please immediately call 911 or go to the emergency department.  Pager Numbers  - Dr. Gwen Pounds: [phone]  - Dr. Neale Burly: [phone]  - Dr. Roseanne Reno: [phone]  In the event of inclement weather, please call our main line at [phone] for an update on the  status of any delays or closures.  Dermatology Medication Tips: Please keep the boxes that topical medications come in in order to help keep track of the instructions about where and how to use these. Pharmacies typically print the medication instructions only on the boxes and not directly on the medication tubes.   If your medication is too expensive, please contact our office at [phone] option 4 or send [place] a message through MyChart.   We are unable to tell what your co-pay for medications will be in advance as this is different depending on your insurance coverage. However, we may be able to find a substitute medication at lower cost or fill out paperwork to get insurance to cover a needed medication.   If a prior authorization is required to get your medication covered by your insurance company, please allow [place] 1-2 business days to complete this process.  Drug prices often vary depending on where the prescription is filled and some pharmacies may offer cheaper prices.  The website www.goodrx.com contains coupons for medications through different pharmacies. The prices here do not account for what the cost may be with help from insurance (it may be cheaper with your insurance), but the website can give you the price if you did not use any insurance.  - You can print the associated coupon and take it with your prescription to the pharmacy.  - You may also stop by our office during regular business hours and pick up a GoodRx coupon card.  - If you need your prescription sent electronically to a different pharmacy, notify our office through [facility] or by phone at [phone] option 4.     Si Usted Necesita Algo Despus de Su Visita  Tambin puede enviarnos un mensaje a travs de Clinical cytogeneticist. Por lo general respondemos a los mensajes de MyChart en el transcurso de 1 a 2 das hbiles.  Para renovar recetas, por favor pida a su farmacia que se ponga en contacto con nuestra oficina.  Annie Sable de fax es [place] [phone].  Si tiene un asunto urgente cuando la clnica est cerrada y que no puede esperar hasta el siguiente da hbil, puede llamar/localizar a su doctor(a) al nmero que aparece a continuacin.   Por favor, tenga en cuenta que aunque hacemos todo lo posible para estar disponibles para asuntos urgentes fuera del horario de [place], no estamos disponibles las 24 horas del da, los 7 [address]  [address] de la [place].   Si tiene un problema urgente y no puede comunicarse con nosotros, puede optar por buscar atencin mdica  en el consultorio de su doctor(a), en una clnica privada, en un centro de atencin urgente o en una sala de emergencias.  Si tiene Engineer, drilling, por favor llame inmediatamente al 911 o vaya a la sala de emergencias.  Nmeros de bper  - Dr. Gwen Pounds: [phone]  - Dra. Moye: [phone]  - Dra. Roseanne Reno: [phone]  En caso de inclemencias del [place], por favor llame a Lacy Duverney principal al [phone] para una actualizacin sobre el [place] de cualquier retraso o cierre.  Consejos para la medicacin en dermatologa:  Por favor, guarde las cajas en las que vienen los medicamentos de uso tpico para ayudarle a seguir las instrucciones sobre dnde y cmo usarlos. Las farmacias generalmente imprimen las instrucciones del medicamento slo en las cajas y no directamente en los tubos del [place].   Si su medicamento es muy caro, por favor, pngase en contacto con Rolm Gala llamando al [phone] y presione la opcin 4 o envenos un mensaje a travs de Clinical cytogeneticist.   No podemos decirle cul ser su copago por los medicamentos por adelantado ya que esto es diferente dependiendo de la cobertura de su seguro. Sin embargo, es posible que podamos encontrar un medicamento sustituto a Audiological scientist un formulario para que el seguro cubra el medicamento que se considera necesario.   Si se requiere una autorizacin previa para que su  compaa de seguros [place] su medicamento, por favor permtanos de 1 a 2 das hbiles para completar [address]  Los precios de los medicamentos varan con frecuencia dependiendo del Environmental consultant de dnde se surte la receta y alguna farmacias pueden ofrecer precios ms baratos.  El sitio web www.goodrx.com tiene cupones para medicamentos de Health and safety inspector. Los precios aqu no tienen en cuenta lo que podra costar con la ayuda del seguro (puede ser ms barato con su seguro), pero el sitio web puede darle el precio si no utiliz Tourist information centre manager.  - Puede imprimir el cupn correspondiente y llevarlo con su receta a la farmacia.  - Tambin puede pasar por nuestra oficina durante el horario de atencin regular y Education officer, museum una tarjeta de cupones de GoodRx.  - Si necesita que su receta se enve electrnicamente a una farmacia diferente, informe a nuestra oficina a travs de MyChart de [place] o por telfono llamando al [phone] y presione la opcin 4.

## 2023-02-14 NOTE — Progress Notes (Signed)
Follow-Up Visit   Subjective  Martha Page is a 42 y.o. female who presents for the following: Acne Vulgaris  Patient has done a course of accutane in the past. She is not currently using anything for acne or rosacea. Acne at chest, back and face, redness at face. Patient also c/o powdery/sandy substance at scalp and hairline.   The following portions of the chart were reviewed this encounter and updated as appropriate: medications, allergies, medical history  Review of Systems:  No other skin or systemic complaints except as noted in HPI or Assessment and Plan.  Objective  Well appearing patient in no apparent distress; mood and affect are within normal limits.  Areas Examined: Face, chest and back  Relevant exam findings are noted in the Assessment and Plan.   Assessment & Plan    ACNE VULGARIS Exam: few inflammatory papules at lower face and neck  Treatment Plan: Consider spironolactone at follow-up if not clearing with topical ivermectin  Patient has had Mirena IUD x 6 years.  ROSACEA Exam Mid face erythema with telangiectasias + scattered inflammatory papules at mid-face, low-face and neck  Chronic and persistent condition with duration or expected duration over one year. Condition is bothersome/symptomatic for patient. Currently flared.  Rosacea is a chronic progressive skin condition usually affecting the face of adults, causing redness and/or acne bumps. It is treatable but not curable. It sometimes affects the eyes (ocular rosacea) as well. It may respond to topical and/or systemic medication and can flare with stress, sun exposure, alcohol, exercise, topical steroids (including hydrocortisone/cortisone 10) and some foods.  Daily application of broad spectrum spf 30+ sunscreen to face is recommended to reduce flares.  Treatment Plan Will prescribe [facility] metronidazole/ivermectin/azelaic acid twice daily as needed to affected areas on the face. The  patient was advised this is not covered by insurance since it is made by a compounding pharmacy. They will receive an email to check out and the medication will be mailed to their home.    Counseling for BBL / IPL / Laser and Coordination of Care Discussed the treatment option of Broad Band Light (BBL) /Intense Pulsed Light (IPL)/ Laser for skin discoloration, including brown spots and redness.  Typically we recommend at least 1-3 treatment sessions about 5-8 weeks apart for best results.  Cannot have tanned skin when BBL performed, and regular use of sunscreen is advised after the procedure to help maintain results. The patient's condition may also require "maintenance treatments" in the future.  The fee for BBL / laser treatments is $350 per treatment session for the whole face.  A fee can be quoted for other parts of the body.  Insurance typically does not pay for BBL/laser treatments and therefore the fee is an out-of-pocket cost.  FOLLICULITIS Exam: Perifollicular erythematous papules and pustules  Treatment Plan: Apply ketoconazole shampoo as a body wash three times per week, leave in for 10 minutes before rinsing out.   SEBORRHEIC DERMATITIS Exam: Pink patches with greasy scale at scalp  Chronic and persistent condition with duration or expected duration over one year. Condition is symptomatic/ bothersome to patient. Not currently at goal.  Seborrheic Dermatitis is a chronic persistent rash characterized by pinkness and scaling most commonly of the mid face but also can occur on the scalp (dandruff), ears; mid chest, mid back and groin.  It tends to be exacerbated by stress and cooler weather.  People who have neurologic disease may experience new onset or exacerbation of existing seborrheic  dermatitis.  The condition is not curable but treatable and can be controlled.  Treatment Plan: Apply ketoconazole shampoo apply three times per week, massage into scalp and leave in for 10 minutes  before rinsing out. This can be followed by conditioner or shampoo and conditioner of your choice.    Return in about 4 weeks (around 03/14/2023) for Rosacea.  Anise Salvo, RMA, am acting as scribe for Darden Dates, MD .   Documentation: I have reviewed the above documentation for accuracy and completeness, and I agree with the above.  Darden Dates, MD

## 2023-02-15 MED ORDER — "LUER LOCK SAFETY SYRINGES 25G X 1"" 3 ML MISC": 1.0000 | 1 refills | Status: DC

## 2023-03-23 ENCOUNTER — Ambulatory Visit: Payer: 59 | Admitting: Dermatology

## 2023-03-29 ENCOUNTER — Ambulatory Visit (INDEPENDENT_AMBULATORY_CARE_PROVIDER_SITE_OTHER): Payer: 59 | Admitting: Dermatology

## 2023-03-29 VITALS — BP 120/77 | HR 87

## 2023-03-29 DIAGNOSIS — L719 Rosacea, unspecified: Secondary | ICD-10-CM

## 2023-03-29 DIAGNOSIS — L739 Follicular disorder, unspecified: Secondary | ICD-10-CM | POA: Diagnosis not present

## 2023-03-29 DIAGNOSIS — L219 Seborrheic dermatitis, unspecified: Secondary | ICD-10-CM

## 2023-03-29 MED ORDER — DOXYCYCLINE 40 MG PO CPDR: 40.0000 mg | DELAYED_RELEASE_CAPSULE | ORAL | 3 refills | Status: DC

## 2023-03-29 MED ORDER — KETOCONAZOLE 2 % EX SHAM: MEDICATED_SHAMPOO | CUTANEOUS | 2 refills | Status: DC

## 2023-03-29 MED ORDER — MOMETASONE FUROATE 0.1 % EX SOLN: Freq: Every day | CUTANEOUS | 0 refills | Status: DC

## 2023-03-29 MED ORDER — ZILXI 1.5 % EX FOAM: CUTANEOUS | 3 refills | Status: DC

## 2023-03-29 NOTE — Progress Notes (Signed)
Follow-Up Visit   Subjective  Martha Page is a 42 y.o. female who presents for the following: rosacea, currently using [facility] mix QD, hasn't noticed a lot of improvement, although the medication sat in [place] for about a week before being shipped to her. Seborrheic dermatitis of the scalp, currently using Ketoconazole 2% shampoo, but she hasn't noticed a lot of improvement, folliculitis of the trunk, currently using Ketoconazole 2% shampoo QD, but continues to flare.   The following portions of the chart were reviewed this encounter and updated as appropriate: medications, allergies, medical history  Review of Systems:  No other skin or systemic complaints except as noted in HPI or Assessment and Plan.  Objective  Well appearing patient in no apparent distress; mood and affect are within normal limits.  A focused examination was performed of the following areas:   Relevant exam findings are noted in the Assessment and Plan.    Assessment & Plan   ROSACEA Exam Mid face erythema with telangiectasias + scattered inflammatory papules  Chronic and persistent condition with duration or expected duration over one year. Condition is symptomatic/ bothersome to patient. Not currently at goal.   Rosacea is a chronic progressive skin condition usually affecting the face of adults, causing redness and/or acne bumps. It is treatable but not curable. It sometimes affects the eyes (ocular rosacea) as well. It may respond to topical and/or systemic medication and can flare with stress, sun exposure, alcohol, exercise, topical steroids (including hydrocortisone/cortisone 10) and some foods.  Daily application of broad spectrum spf 30+ sunscreen to face is recommended to reduce flares.  Treatment Plan  D/C [facility] mix : metronidazole/ivermectin/azelaic Acid. Of note, this was lost in the mail for an extra week, so we could consider a repeat trial in future in case being in  the heat made it lose its potency if other treatments are not effective.  Start Oracea 40 mg po QD if not covered will send in Doxycycline 20 mg po BID. Doxycycline should be taken with food to prevent nausea. Do not lay down for 30 minutes after taking. Be cautious with sun exposure and use good sun protection while on this medication. Pregnant women should not take this medication. May stop 5 days prior to vacation if doing well.  Start Zilxi foam QD.   Recommend [facility] Hypochlorous Spray (found in the wound care section) OR Cln brand Acne or Sports wash. The [facility] Hypochlorous Spray can be sprayed on daily and left on. The Cln wash should be applied to the affected area daily for at least 30 seconds and then rinsed off. If you are using clindamycin solution or lotion or another topical antibiotic to treat acne, using a hypochlorous product may help lower the risk of antibiotic resistant bacteria.    SEBORRHEIC DERMATITIS Exam: Pink patches with greasy scale at scalp  Chronic and persistent condition with duration or expected duration over one year. Condition is symptomatic/ bothersome to patient. Not currently at goal.  Seborrheic Dermatitis is a chronic persistent rash characterized by pinkness and scaling most commonly of the mid face but also can occur on the scalp (dandruff), ears; mid chest, mid back and groin.  It tends to be exacerbated by stress and cooler weather.  People who have neurologic disease may experience new onset or exacerbation of existing seborrheic dermatitis.  The condition is not curable but treatable and can be controlled.  Treatment Plan: Continue Ketoconazole 2% shampoo let sit 10 minutes then wash  out. Use 3d/wk.   Start Mometasone solution to aa's scalp QD-BID PRN. Topical steroids (such as triamcinolone, fluocinolone, fluocinonide, mometasone, clobetasol, halobetasol, betamethasone, hydrocortisone) can cause thinning and lightening of the skin if they are  used for too long in the same area. Your physician has selected the right strength medicine for your problem and area affected on the body. Please use your medication only as directed by your physician to prevent side effects.   FOLLICULITIS Exam: Perifollicular erythematous papules and pustules  Treatment Plan: Continue Ketoconazole 2% shampoo QD. Leave on for 10 minutes then wash off.   Start Cln sport wash daily in the shower. Or, consider [facility] Hypochlorous Spray (found in the wound care section) OR Cln brand Acne or Sports wash. The [facility] Hypochlorous Spray can be sprayed on daily and left on. The Cln wash should be applied to the affected area daily for at least 30 seconds and then rinsed off. If you are using clindamycin solution or lotion or another topical antibiotic to treat acne, using a hypochlorous product may help lower the risk of antibiotic resistant bacteria.   Doxycycline 40 mg po QD may also help improve condition. May stop 5 days before vacation if doing well.   Return in about 3 months (around 06/29/2023) for rosacea, folliculitis, and seb derm follow up .  Martha Page, CMA, am acting as scribe for Darden Dates, MD .  Documentation: I have reviewed the above documentation for accuracy and completeness, and I agree with the above.  Darden Dates, MD

## 2023-03-29 NOTE — Patient Instructions (Addendum)
Recommend taking Heliocare sun protection supplement daily in sunny weather for additional sun protection. For maximum protection on the sunniest days, you can take up to 2 capsules of regular Heliocare OR take 1 capsule of Heliocare Ultra. For prolonged exposure (such as a full day in the sun), you can repeat your dose of the supplement 4 hours after your first dose. Heliocare can be purchased at [employer], at some [facility] or at [email or web address].   Recommend [facility] Hypochlorous Spray (found in the wound care section) OR Cln brand Acne or Sports wash. The [facility] Hypochlorous Spray can be sprayed on daily and left on. The Cln wash should be applied to the affected area daily for at least 30 seconds and then rinsed off. If you are using clindamycin solution or lotion or another topical antibiotic to treat acne, using a hypochlorous product may help lower the risk of antibiotic resistant bacteria.   Doxycycline should be taken with food to prevent nausea. Do not lay down for 30 minutes after taking. Be cautious with sun exposure and use good sun protection while on this medication. Pregnant women should not take this medication.   Due to recent changes in healthcare laws, you may see results of your pathology and/or laboratory studies on MyChart before the doctors have had a chance to review them. We understand that in some cases there may be results that are confusing or concerning to you. Please understand that not all results are received at the same time and often the doctors may need to interpret multiple results in order to provide you with the best plan of care or course of treatment. Therefore, we ask that you please give [place] 2 business days to thoroughly review all your results before contacting the office for clarification. Should we see a critical lab result, you will be contacted sooner.   If You Need Anything After Your Visit  If you have any questions or concerns for your  doctor, please call our main line at [phone] and press option 4 to reach your doctor's medical assistant. If no one answers, please leave a voicemail as directed and we will return your call as soon as possible. Messages left after 4 pm will be answered the following business day.   You may also send [place] a message via MyChart. We typically respond to MyChart messages within 1-2 business days.  For prescription refills, please ask your pharmacy to contact our office. Our fax number is [phone].  If you have an urgent issue when the clinic is closed that cannot wait until the next business day, you can page your doctor at the number below.    Please note that while we do our best to be available for urgent issues outside of office hours, we are not available 24/7.   If you have an urgent issue and are unable to reach [place], you may choose to seek medical care at your doctor's office, retail clinic, urgent care center, or emergency room.  If you have a medical emergency, please immediately call 911 or go to the emergency department.  Pager Numbers  - Dr. Gwen Pounds: [phone]  - Dr. Neale Burly: [phone]  - Dr. Roseanne Reno: [phone]  In the event of inclement weather, please call our main line at [phone] for an update on the status of any delays or closures.  Dermatology Medication Tips: Please keep the boxes that topical medications come in in order to help keep track of the instructions about where and how to  use these. Pharmacies typically print the medication instructions only on the boxes and not directly on the medication tubes.   If your medication is too expensive, please contact our office at [phone] option 4 or send [place] a message through MyChart.   We are unable to tell what your co-pay for medications will be in advance as this is different depending on your insurance coverage. However, we may be able to find a substitute medication at lower cost or fill out paperwork  to get insurance to cover a needed medication.   If a prior authorization is required to get your medication covered by your insurance company, please allow [place] 1-2 business days to complete this process.  Drug prices often vary depending on where the prescription is filled and some pharmacies may offer cheaper prices.  The website www.goodrx.com contains coupons for medications through different pharmacies. The prices here do not account for what the cost may be with help from insurance (it may be cheaper with your insurance), but the website can give you the price if you did not use any insurance.  - You can print the associated coupon and take it with your prescription to the pharmacy.  - You may also stop by our office during regular business hours and pick up a GoodRx coupon card.  - If you need your prescription sent electronically to a different pharmacy, notify our office through [facility] or by phone at [phone] option 4.     Si Usted Necesita Algo Despus de Su Visita  Tambin puede enviarnos un mensaje a travs de Clinical cytogeneticist. Por lo general respondemos a los mensajes de MyChart en el transcurso de 1 a 2 das hbiles.  Para renovar recetas, por favor pida a su farmacia que se ponga en contacto con nuestra oficina. Annie Sable de fax es [place] [phone].  Si tiene un asunto urgente cuando la clnica est cerrada y que no puede esperar hasta el siguiente da hbil, puede llamar/localizar a su doctor(a) al nmero que aparece a continuacin.   Por favor, tenga en cuenta que aunque hacemos todo lo posible para estar disponibles para asuntos urgentes fuera del horario de [place], no estamos disponibles las 24 horas del da, los 7 [address]  [address] de la [place].   Si tiene un problema urgente y no puede comunicarse con nosotros, puede optar por buscar atencin mdica  en el consultorio de su doctor(a), en una clnica privada, en un centro de atencin urgente o en una sala de  emergencias.  Si tiene Engineer, drilling, por favor llame inmediatamente al 911 o vaya a la sala de emergencias.  Nmeros de bper  - Dr. Gwen Pounds: [phone]  - Dra. Moye: [phone]  - Dra. Roseanne Reno: [phone]  En caso de inclemencias del [place], por favor llame a Lacy Duverney principal al [phone] para una actualizacin sobre el [place] de cualquier retraso o cierre.  Consejos para la medicacin en dermatologa: Por favor, guarde las cajas en las que vienen los medicamentos de uso tpico para ayudarle a seguir las instrucciones sobre dnde y cmo usarlos. Las farmacias generalmente imprimen las instrucciones del medicamento slo en las cajas y no directamente en los tubos del [place].   Si su medicamento es muy caro, por favor, pngase en contacto con Rolm Gala llamando al [phone] y presione la opcin 4 o envenos un mensaje a travs de Clinical cytogeneticist.   No podemos decirle cul ser su copago por los medicamentos por adelantado ya que esto es diferente dependiendo de  la cobertura de su seguro. Sin embargo, es posible que podamos encontrar un medicamento sustituto a Audiological scientist un formulario para que el seguro cubra el medicamento que se considera necesario.   Si se requiere una autorizacin previa para que su compaa de seguros [place] su medicamento, por favor permtanos de 1 a 2 das hbiles para completar [address]  Los precios de los medicamentos varan con frecuencia dependiendo del Environmental consultant de dnde se surte la receta y alguna farmacias pueden ofrecer precios ms baratos.  El sitio web www.goodrx.com tiene cupones para medicamentos de Health and safety inspector. Los precios aqu no tienen en cuenta lo que podra costar con la ayuda del seguro (puede ser ms barato con su seguro), pero el sitio web puede darle el precio si no utiliz Tourist information centre manager.  - Puede imprimir el cupn correspondiente y llevarlo con su receta a la farmacia.  - Tambin puede pasar por  nuestra oficina durante el horario de atencin regular y Education officer, museum una tarjeta de cupones de GoodRx.  - Si necesita que su receta se enve electrnicamente a una farmacia diferente, informe a nuestra oficina a travs de MyChart de [place] o por telfono llamando al [phone] y presione la opcin 4.

## 2023-04-04 ENCOUNTER — Other Ambulatory Visit: Payer: Self-pay | Admitting: Internal Medicine

## 2023-04-04 ENCOUNTER — Encounter: Payer: Self-pay | Admitting: Dermatology

## 2023-04-04 MED ORDER — DOXYCYCLINE HYCLATE 20 MG PO TABS: 20.0000 mg | ORAL_TABLET | Freq: Two times a day (BID) | ORAL | 3 refills | Status: DC

## 2023-04-05 MED ORDER — LINACLOTIDE 145 MCG PO CAPS: 145.0000 ug | ORAL_CAPSULE | Freq: Every day | ORAL | 0 refills | Status: DC

## 2023-04-15 ENCOUNTER — Other Ambulatory Visit: Payer: Self-pay | Admitting: Internal Medicine

## 2023-04-17 ENCOUNTER — Encounter: Payer: Self-pay | Admitting: Internal Medicine

## 2023-04-17 ENCOUNTER — Other Ambulatory Visit: Payer: Self-pay | Admitting: Internal Medicine

## 2023-04-17 MED ORDER — LINACLOTIDE 145 MCG PO CAPS: 145.0000 ug | ORAL_CAPSULE | Freq: Every day | ORAL | 0 refills | Status: DC

## 2023-04-17 MED ORDER — SUMATRIPTAN SUCCINATE 100 MG PO TABS: 100.0000 mg | ORAL_TABLET | ORAL | 0 refills | Status: DC | PRN

## 2023-04-17 MED ORDER — IBUPROFEN 800 MG PO TABS: 800.0000 mg | ORAL_TABLET | Freq: Three times a day (TID) | ORAL | 0 refills | Status: DC | PRN

## 2023-04-17 NOTE — Telephone Encounter (Signed)
Requested medication (s) are due for refill today: Yes  Requested medication (s) are on the active medication list: Yes  Last refill:  08/25/22  Future visit scheduled: Yes  Notes to clinic:  Pt. Still on medication?    Requested Prescriptions  Pending Prescriptions Disp Refills   cyanocobalamin (VITAMIN B12) 1000 MCG/ML injection [Pharmacy Med Name: CYANOCOBALAMIN 1,000 MCG/ML VL] 1 mL 4    Sig: INJECT INTO THE MUSCLE EVERY 30 DAYS     Endocrinology:  Vitamins - Vitamin B12 Failed - 04/15/2023  8:41 AM      Failed - HCT in normal range and within 360 days    Hematocrit  Date Value Ref Range Status  12/02/2021 39.5 34.0 - 46.6 % Final         Failed - HGB in normal range and within 360 days    Hemoglobin  Date Value Ref Range Status  12/02/2021 13.5 11.1 - 15.9 g/dL Final         Passed - B12 Level in normal range and within 360 days    Vitamin B-12  Date Value Ref Range Status  08/24/2022 273 200 - 1,100 pg/mL Final    Comment:    . Please Note: Although the reference range for vitamin B12 is [phone] pg/mL, it has been reported that between 5 and 10% of patients with values between 200 and 400 pg/mL may experience neuropsychiatric and hematologic abnormalities due to occult B12 deficiency; less than 1% of patients with values above 400 pg/mL will have symptoms. Verna Czech - Valid encounter within last 12 months    Recent Outpatient Visits           5 months ago Encounter for general adult medical examination with abnormal findings   [facility] [place], Salvadore Oxford, NP   7 months ago Hot flashes   [facility] [place], Salvadore Oxford, NP   8 months ago Medication monitoring encounter   [facility] [place], Salvadore Oxford, NP   9 months ago Nail problem   [facility] [place], Salvadore Oxford, NP   12 months ago Prediabetes   [facility]  [place], Salvadore Oxford, NP       Future Appointments             In 1 month Baity, Salvadore Oxford, NP  [facility], [place]   In 4 months Elie Goody, MD [facility]

## 2023-04-20 ENCOUNTER — Encounter: Payer: Self-pay | Admitting: Internal Medicine

## 2023-04-20 MED ORDER — AZITHROMYCIN 250 MG PO TABS: ORAL_TABLET | ORAL | 0 refills | Status: DC

## 2023-05-16 ENCOUNTER — Encounter: Payer: Self-pay | Admitting: Internal Medicine

## 2023-05-22 ENCOUNTER — Encounter: Payer: Self-pay | Admitting: Internal Medicine

## 2023-05-22 ENCOUNTER — Ambulatory Visit (INDEPENDENT_AMBULATORY_CARE_PROVIDER_SITE_OTHER): Payer: 59 | Admitting: Internal Medicine

## 2023-05-22 VITALS — BP 104/62 | HR 95 | Temp 96.6°F | Wt 212.0 lb

## 2023-05-22 DIAGNOSIS — K219 Gastro-esophageal reflux disease without esophagitis: Secondary | ICD-10-CM

## 2023-05-22 DIAGNOSIS — R55 Syncope and collapse: Secondary | ICD-10-CM

## 2023-05-22 DIAGNOSIS — K5909 Other constipation: Secondary | ICD-10-CM | POA: Insufficient documentation

## 2023-05-22 DIAGNOSIS — R232 Flushing: Secondary | ICD-10-CM | POA: Diagnosis not present

## 2023-05-22 DIAGNOSIS — E7800 Pure hypercholesterolemia, unspecified: Secondary | ICD-10-CM | POA: Diagnosis not present

## 2023-05-22 DIAGNOSIS — K1379 Other lesions of oral mucosa: Secondary | ICD-10-CM | POA: Insufficient documentation

## 2023-05-22 DIAGNOSIS — F32A Depression, unspecified: Secondary | ICD-10-CM

## 2023-05-22 DIAGNOSIS — G4733 Obstructive sleep apnea (adult) (pediatric): Secondary | ICD-10-CM

## 2023-05-22 DIAGNOSIS — E6609 Other obesity due to excess calories: Secondary | ICD-10-CM

## 2023-05-22 DIAGNOSIS — G44021 Chronic cluster headache, intractable: Secondary | ICD-10-CM

## 2023-05-22 DIAGNOSIS — R7303 Prediabetes: Secondary | ICD-10-CM

## 2023-05-22 DIAGNOSIS — Z6835 Body mass index (BMI) 35.0-35.9, adult: Secondary | ICD-10-CM

## 2023-05-22 DIAGNOSIS — F5104 Psychophysiologic insomnia: Secondary | ICD-10-CM

## 2023-05-22 DIAGNOSIS — F419 Anxiety disorder, unspecified: Secondary | ICD-10-CM

## 2023-05-22 DIAGNOSIS — M722 Plantar fascial fibromatosis: Secondary | ICD-10-CM | POA: Insufficient documentation

## 2023-05-22 DIAGNOSIS — R42 Dizziness and giddiness: Secondary | ICD-10-CM

## 2023-05-22 MED ORDER — MELOXICAM 15 MG PO TABS: 15.0000 mg | ORAL_TABLET | Freq: Every day | ORAL | 1 refills | Status: DC

## 2023-05-22 NOTE — Patient Instructions (Signed)
Managing Hot Flashes During Menopause You will learn what hot flashes/night sweats are, what causes them and what the treatment methods are. To view the content, go to this web address: [email or web address]  This video will expire on: 01/04/2025. If you need access to this video following this date, please reach out to the healthcare provider who assigned it to you. This information is not intended to replace advice given to you by your health care provider. Make sure you discuss any questions you have with your health care provider. Elsevier Patient Education  2024 [employer].

## 2023-05-22 NOTE — Assessment & Plan Note (Signed)
Encourage weight loss as this can help reduce sleep apnea symptoms Encouraged regular use of CPAP

## 2023-05-22 NOTE — Assessment & Plan Note (Signed)
 C-Met and lipid profile today Encouraged her to consume a low-fat diet

## 2023-05-22 NOTE — Assessment & Plan Note (Signed)
Avoid foods that trigger reflux Encourage weight loss as this can help reduce reflux symptoms Continue pantoprazole

## 2023-05-22 NOTE — Progress Notes (Signed)
Subjective:    Patient ID: Martha Page, female    DOB: [birth year], 42 y.o.   MRN: [id number]  HPI  Pt presents to the clinic today for follow up chronic conditions.  Cluster headaches: These occur 1-2 times per month.  She is not sure what triggers this.  She takes tylenol, ibuprofen or imitrex as needed with good relief of symptoms.  She does not follow with neurology.  GERD: She is not sure what triggers this.  She reports intermittent choking episodes on pantoprazole.  Upper GI from 07/2016 reviewed.  OSA: She averages 7 hours of sleep per night with intermittent use of her CPAP.  Sleep study from 05/2019 reviewed.  Oral ulcers: Triggered by acidic foods.  She does not take any medication for this.  POTS: She denies recent syncopal episode.  She has seen cardiology and neurology in the past for this but does not currently follow with them.  Anxiety and depression: Chronic, managed on venlafaxine, bupropion and lorazepam.  She is not currently seeing a therapist.  She denies SI/HI.  Insomnia: She has difficulty falling asleep.  She takes trazodone as prescribed.  Sleep study from 05/2019 reviewed.  Prediabetes: Her last A1c was 5.6%, 11/2022.  She is not taking any oral diabetic medication at this time.  She does not check her sugars.  Chronic constipation: Managed with colace daily and linzess as needed.  Colonoscopy from 01/2022 reviewed.  HLD: Her last LDL was 102, triglycerides 83, 11/2022.  She is not taking any cholesterol-lowering medication at this time.  She tries to consume low-fat diet.  Review of Systems     Past Medical History:  Diagnosis Date   Acne    iPledge: [phone]   Anxiety    Dermatitis    eczema   Elevated liver enzymes    Frequent headaches    GERD (gastroesophageal reflux disease)    Hx of dysplastic nevus 04/02/2013   Left distal knee. Mild atypia, margins free   Migraine    2x/6 mos   Pneumonia 06/30/2016   has finished antibiotics.  still  has lingering nighttime cough   Shoulder pain    Sleep apnea    uses CPAP    Current Outpatient Medications  Medication Sig Dispense Refill   azithromycin (ZITHROMAX) 250 MG tablet Take 2 tabs today, then 1 tab daily x 4 days 6 tablet 0   buPROPion ER (WELLBUTRIN SR) 100 MG 12 hr tablet Take 100 mg by mouth 2 (two) times daily.     cetirizine (ZYRTEC) 10 MG tablet TAKE 1 TABLET BY MOUTH EVERY DAY 90 tablet 0   cyanocobalamin (VITAMIN B12) 1000 MCG/ML injection INJECT INTO THE MUSCLE EVERY 30 DAYS 1 mL 4   doxycycline (PERIOSTAT) 20 MG tablet Take 1 tablet (20 mg total) by mouth 2 (two) times daily. 60 tablet 3   Fluvoxamine Maleate 100 MG CP24 Take 1 capsule (100 mg total) by mouth daily. 90 capsule 0   ibuprofen (ADVIL) 800 MG tablet Take 1 tablet (800 mg total) by mouth every 8 (eight) hours as needed. 30 tablet 0   ketoconazole (NIZORAL) 2 % shampoo apply three times per week, massage into scalp and leave in for 10 minutes before rinsing out 120 mL 2   linaclotide (LINZESS) 145 MCG CAPS capsule Take 1 capsule (145 mcg total) by mouth daily before breakfast. 90 capsule 0   LORazepam (ATIVAN) 0.5 MG tablet Take 0.25 mg by mouth 2 (two) times daily as  needed.     Minocycline HCl Micronized (ZILXI) 1.5 % FOAM Apply to the face QD. 30 g 3   MIRENA, 52 MG, 20 MCG/24HR IUD      MOBIC 15 MG tablet Take 15 mg by mouth daily.     mometasone (ELOCON) 0.1 % lotion Apply topically daily. 60 mL 0   pantoprazole (PROTONIX) 40 MG tablet TAKE ONE TABLET BY MOUTH TWICE A DAY 180 tablet 1   pantoprazole (PROTONIX) 40 MG tablet Take 1 tablet (40 mg total) by mouth 2 (two) times daily. 180 tablet 1   SUMAtriptan (IMITREX) 100 MG tablet Take 1 tablet (100 mg total) by mouth every 2 (two) hours as needed for migraine. May repeat in 2 hours if headache persists or recurs. 10 tablet 0   SYRINGE-NEEDLE, DISP, 3 ML (LUER LOCK SAFETY SYRINGES) 25G X 1" 3 ML MISC Inject 1 Device into the skin every 30 (thirty)  days. 6 each 1   traZODone (DESYREL) 50 MG tablet TAKE 0.5-1 TABLETS BY MOUTH AT BEDTIME AS NEEDED FOR SLEEP. 90 tablet 0   venlafaxine (EFFEXOR) 75 MG tablet Take 1 tablet every day by oral route.     No current facility-administered medications for this visit.    Allergies  Allergen Reactions   Cefazolin Rash and Shortness Of Breath   Codeine Sulfate Itching   Red Dye Hives   Sulfa Antibiotics Other (See Comments)    "Stomach pain"   Amoxicillin Rash   Keflex [Cephalexin] Swelling and Rash   Penicillin V Potassium Rash    Family History  Problem Relation Age of Onset   Stroke Mother    Depression Mother    Interstitial cystitis Mother    Fibromyalgia Mother    Diabetes Mother    Post-traumatic stress disorder Mother    Anxiety disorder Mother    Heart disease Father    Throat cancer Father    Hypertension Father    Healthy Sister    Arthritis Paternal Grandmother    Lung cancer Paternal Grandmother    Heart disease Paternal Grandmother    Stroke Paternal Grandmother    Hypertension Paternal Grandmother    Aortic aneurysm Brother 25   Breast cancer Neg Hx     Social History   Socioeconomic History   Marital status: Married    Spouse name: Tinnie Gens   Number of children: 6   Years of education: Not on file   Highest education level: Some college, no degree  Occupational History   Not on file  Tobacco Use   Smoking status: Former    Current packs/day: 0.00    Types: Cigarettes    Quit date: 07/01/2011    Years since quitting: 11.8   Smokeless tobacco: Never  Vaping Use   Vaping status: Never Used  Substance and Sexual Activity   Alcohol use: No    Alcohol/week: 0.0 standard drinks of alcohol   Drug use: No   Sexual activity: Yes    Birth control/protection: I.U.D.  Other Topics Concern   Not on file  Social History Narrative   Married.   6 children.   Works as a Futures trader.      Social Determinants of Health   Financial Resource Strain: Not on  file  Food Insecurity: Not on file  Transportation Needs: Not on file  Physical Activity: Not on file  Stress: Not on file  Social Connections: Not on file  Intimate Partner Violence: Not on file     Constitutional: Patient  reports intermittent headaches.  Denies fever, malaise, fatigue, or abrupt weight changes.  HEENT: Pt reports abnormal sensation of tongue. Denies eye pain, eye redness, ear pain, ringing in the ears, wax buildup, runny nose, nasal congestion, bloody nose, or sore throat. Respiratory: Denies difficulty breathing, shortness of breath, cough or sputum production.   Cardiovascular: Denies chest pain, chest tightness, palpitations or swelling in the hands or feet.  Gastrointestinal: Denies abdominal pain, bloating, constipation, diarrhea or blood in the stool.  GU: Pt reports amenorrhea (has IUD). Denies urgency, frequency, pain with urination, burning sensation, blood in urine, odor or discharge. Musculoskeletal: Pt reports bilateral foot pain, left knee pain. Denies decrease in range of motion, difficulty with gait, muscle pain or joint swelling.  Skin: Denies redness, rashes, lesions or ulcercations.  Neurological: Pt reports hot flashes. Denies dizziness, difficulty with memory, difficulty with speech or problems with balance and coordination.  Psych: Patient has a history of anxiety and depression.  Denies SI/HI.  No other specific complaints in a complete review of systems (except as listed in HPI above).  Objective:   Physical Exam  BP 104/62 (BP Location: Left Arm, Patient Position: Sitting, Cuff Size: Large)   Pulse 95   Temp (!) 96.6 F (35.9 C) (Temporal)   Wt 212 lb (96.2 kg)   SpO2 98%   BMI 35.28 kg/m   Wt Readings from Last 3 Encounters:  11/09/22 209 lb (94.8 kg)  08/24/22 196 lb (88.9 kg)  08/02/22 199 lb (90.3 kg)    General: Appears her stated age, obese, in NAD. Skin: Warm, dry and intact. HEENT: Head: normal shape and size; Eyes: sclera  white, no icterus, conjunctiva pink, PERRLA and EOMs intact; Throat/Mouth: Teeth present, mucosa pink and moist, no exudate, lesions or ulcerations noted.  Cardiovascular: Normal rate and rhythm. S1,S2 noted.  No murmur, rubs or gallops noted. No JVD or BLE edema.  Pulmonary/Chest: Normal effort and positive vesicular breath sounds. No respiratory distress. No wheezes, rales or ronchi noted.  Abdomen: Normal bowel sounds.  Musculoskeletal: No pain with palpation of the heels. No difficulty with gait.  Neurological: Alert and oriented.  Coordination normal.  Psychiatric: Mood and affect normal. Behavior is normal. Judgment and thought content normal.     BMET    Component Value Date/Time   NA 137 09/13/2022 1406   NA 137 12/02/2021 1557   NA 137 05/26/2014 1855   K 3.9 09/13/2022 1406   K 3.3 (L) 05/26/2014 1855   CL 104 09/13/2022 1406   CL 105 05/26/2014 1855   CO2 24 09/13/2022 1406   CO2 20 (L) 05/26/2014 1855   GLUCOSE 88 09/13/2022 1406   GLUCOSE 131 (H) 05/26/2014 1855   BUN 12 09/13/2022 1406   BUN 12 12/02/2021 1557   BUN 5 (L) 05/26/2014 1855   CREATININE 0.68 09/13/2022 1406   CALCIUM 9.4 09/13/2022 1406   CALCIUM 8.4 (L) 05/26/2014 1855   GFRNONAA >60 01/24/2018 1543   GFRNONAA >60 05/26/2014 1855   GFRAA >60 01/24/2018 1543   GFRAA >60 05/26/2014 1855    Lipid Panel     Component Value Date/Time   CHOL 173 11/09/2022 1033   TRIG 83 11/09/2022 1033   HDL 54 11/09/2022 1033   CHOLHDL 3.2 11/09/2022 1033   VLDL 13.4 10/16/2015 0910   LDLCALC 102 (H) 11/09/2022 1033    CBC    Component Value Date/Time   WBC 14.0 (H) 12/02/2021 1557   WBC 8.2 01/24/2018 1543   RBC  4.76 12/02/2021 1557   RBC 4.62 01/24/2018 1543   HGB 13.5 12/02/2021 1557   HCT 39.5 12/02/2021 1557   PLT 227 12/02/2021 1557   MCV 83 12/02/2021 1557   MCV 87 05/26/2014 1855   MCH 28.4 12/02/2021 1557   MCH 27.9 01/24/2018 1543   MCHC 34.2 12/02/2021 1557   MCHC 33.3 01/24/2018 1543    RDW 13.5 12/02/2021 1557   RDW 18.2 (H) 05/26/2014 1855   LYMPHSABS 2.4 12/02/2021 1557   MONOABS 0.4 04/14/2015 1644   EOSABS 0.0 12/02/2021 1557   BASOSABS 0.0 12/02/2021 1557    Hgb A1C Lab Results  Component Value Date   HGBA1C 5.6 11/09/2022           Assessment & Plan:   Hot flashes:  Will check FSH/LH today   RTC in 6 months for your annual exam Nicki Reaper, NP

## 2023-05-22 NOTE — Assessment & Plan Note (Signed)
Try to identify and avoid triggers And ibuprofen, Tylenol and Imitrex as needed Encourage adequate hydration as dehydration can precipitate headaches

## 2023-05-22 NOTE — Assessment & Plan Note (Signed)
Encouraged high-fiber diet and adequate water intake Continue Colace and Linzess

## 2023-05-22 NOTE — Assessment & Plan Note (Signed)
A1c today Encourage low-carb diet and exercise for weight loss 

## 2023-05-22 NOTE — Assessment & Plan Note (Signed)
Continue trazodone as needed. 

## 2023-05-22 NOTE — Assessment & Plan Note (Signed)
Will have her start meloxicam 15 mg daily

## 2023-05-22 NOTE — Assessment & Plan Note (Signed)
Stable on venlafaxine, bupropion and lorazepam Support offered

## 2023-05-22 NOTE — Assessment & Plan Note (Signed)
Encourage diet and exercise for weight loss 

## 2023-05-22 NOTE — Assessment & Plan Note (Signed)
 Not medicated We will monitor 

## 2023-05-22 NOTE — Assessment & Plan Note (Signed)
Not currently an issue Encourage adequate water intake and make position changes slowly

## 2023-05-23 LAB — CBC
HCT: 40.5 % (ref 35.0–45.0)
Hemoglobin: 13.3 g/dL (ref 11.7–15.5)
MCH: 28.6 pg (ref 27.0–33.0)
MCHC: 32.8 g/dL (ref 32.0–36.0)
MCV: 87.1 fL (ref 80.0–100.0)
MPV: 9.9 fL (ref 7.5–12.5)
Platelets: 121 10*3/uL — ABNORMAL LOW (ref 140–400)
RBC: 4.65 10*6/uL (ref 3.80–5.10)
RDW: 13.1 % (ref 11.0–15.0)
WBC: 8.6 10*3/uL (ref 3.8–10.8)

## 2023-05-23 LAB — HEMOGLOBIN A1C
Hgb A1c MFr Bld: 5.7 % of total Hgb — ABNORMAL HIGH (ref <5.7)
Mean Plasma Glucose: 117 mg/dL
eAG (mmol/L): 6.5 mmol/L

## 2023-05-23 LAB — COMPLETE METABOLIC PANEL WITH GFR
AG Ratio: 1.3 (calc) (ref 1.0–2.5)
ALT: 24 U/L (ref 6–29)
AST: 14 U/L (ref 10–30)
Albumin: 4.4 g/dL (ref 3.6–5.1)
Alkaline phosphatase (APISO): 116 U/L (ref 31–125)
BUN: 10 mg/dL (ref 7–25)
CO2: 24 mmol/L (ref 20–32)
Calcium: 9.6 mg/dL (ref 8.6–10.2)
Chloride: 104 mmol/L (ref 98–110)
Creat: 0.81 mg/dL (ref 0.50–0.99)
Globulin: 3.5 g/dL (calc) (ref 1.9–3.7)
Glucose, Bld: 92 mg/dL (ref 65–99)
Potassium: 4.3 mmol/L (ref 3.5–5.3)
Sodium: 136 mmol/L (ref 135–146)
Total Bilirubin: 0.4 mg/dL (ref 0.2–1.2)
Total Protein: 7.9 g/dL (ref 6.1–8.1)
eGFR: 92 mL/min/{1.73_m2} (ref >=60)

## 2023-05-23 LAB — LIPID PANEL
Cholesterol: 165 mg/dL (ref <200)
HDL: 50 mg/dL (ref >=50)
LDL Cholesterol (Calc): 94 mg/dL (calc)
Non-HDL Cholesterol (Calc): 115 mg/dL (calc) (ref <130)
Total CHOL/HDL Ratio: 3.3 (calc) (ref <5.0)
Triglycerides: 114 mg/dL (ref <150)

## 2023-05-23 LAB — FSH/LH
FSH: 3.6 m[IU]/mL
LH: 4 m[IU]/mL

## 2023-06-19 ENCOUNTER — Other Ambulatory Visit: Payer: Self-pay | Admitting: Internal Medicine

## 2023-06-19 MED ORDER — IBUPROFEN 800 MG PO TABS: 800.0000 mg | ORAL_TABLET | Freq: Three times a day (TID) | ORAL | 0 refills | Status: DC | PRN

## 2023-06-20 ENCOUNTER — Other Ambulatory Visit: Payer: Self-pay | Admitting: Internal Medicine

## 2023-06-21 NOTE — Telephone Encounter (Signed)
Requested Prescriptions  Pending Prescriptions Disp Refills   pantoprazole (PROTONIX) 40 MG tablet [Pharmacy Med Name: PANTOPRAZOLE SOD DR 40 MG TAB] 180 tablet 1    Sig: TAKE 1 TABLET BY MOUTH TWICE A DAY     Gastroenterology: Proton Pump Inhibitors Passed - 06/20/2023  2:47 AM      Passed - Valid encounter within last 12 months    Recent Outpatient Visits           1 month ago Pure hypercholesterolemia   [place] [facility] [place], Salvadore Oxford, NP   7 months ago Encounter for general adult medical examination with abnormal findings   [facility] [place], Salvadore Oxford, NP   10 months ago Hot flashes   [facility] [place], Salvadore Oxford, NP   10 months ago Medication monitoring encounter   [facility] [place], Salvadore Oxford, NP   11 months ago Nail problem   [facility] [place], Salvadore Oxford, NP       Future Appointments             In 3 months Baity, Salvadore Oxford, NP [facility]

## 2023-07-24 ENCOUNTER — Other Ambulatory Visit: Payer: Self-pay | Admitting: Internal Medicine

## 2023-07-25 NOTE — Telephone Encounter (Signed)
Requested medication (s) are due for refill today: yes  Requested medication (s) are on the active medication list: yes  Last refill:  06/19/23 #90 0 refills  Future visit scheduled: yes in 2 months  Notes to clinic:  no refills remain. Do you want to refill Rx?     Requested Prescriptions  Pending Prescriptions Disp Refills   ibuprofen (ADVIL) 800 MG tablet [Pharmacy Med Name: IBUPROFEN 800 MG TABLET] 90 tablet 0    Sig: TAKE 1 TABLET BY MOUTH EVERY 8 HOURS AS NEEDED     Analgesics:  NSAIDS Failed - 07/24/2023  1:17 AM      Failed - Manual Review: Labs are only required if the patient has taken medication for more than 8 weeks.      Failed - PLT in normal range and within 360 days    Platelets  Date Value Ref Range Status  05/22/2023 121 (L) 140 - 400 Thousand/uL Final  12/02/2021 227 150 - 450 x10E3/uL Final         Passed - Cr in normal range and within 360 days    Creat  Date Value Ref Range Status  05/22/2023 0.81 0.50 - 0.99 mg/dL Final         Passed - HGB in normal range and within 360 days    Hemoglobin  Date Value Ref Range Status  05/22/2023 13.3 11.7 - 15.5 g/dL Final  98/09/9146 82.9 11.1 - 15.9 g/dL Final         Passed - HCT in normal range and within 360 days    HCT  Date Value Ref Range Status  05/22/2023 40.5 35.0 - 45.0 % Final   Hematocrit  Date Value Ref Range Status  12/02/2021 39.5 34.0 - 46.6 % Final         Passed - eGFR is 30 or above and within 360 days    EGFR (African American)  Date Value Ref Range Status  05/26/2014 >60  Final   GFR calc Af Amer  Date Value Ref Range Status  01/24/2018 >60 >60 mL/min Final    Comment:    (NOTE) The eGFR has been calculated using the CKD EPI equation. This calculation has not been validated in all clinical situations. eGFR's persistently <60 mL/min signify possible Chronic Kidney Disease.    EGFR (Non-African Amer.)  Date Value Ref Range Status  05/26/2014 >60  Final    Comment:     eGFR values <74mL/min/1.73 m2 may be an indication of chronic kidney disease (CKD). Calculated eGFR is useful in patients with stable renal function. The eGFR calculation will not be reliable in acutely ill patients when serum creatinine is changing rapidly. It is not useful in  patients on dialysis. The eGFR calculation may not be applicable to patients at the low and high extremes of body sizes, pregnant women, and vegetarians.    GFR calc non Af Amer  Date Value Ref Range Status  01/24/2018 >60 >60 mL/min Final   GFR  Date Value Ref Range Status  08/12/2016 80.81 >60.00 mL/min Final   eGFR  Date Value Ref Range Status  05/22/2023 92 > OR = 60 mL/min/1.36m2 Final  12/02/2021 90 >59 mL/min/1.73 Final         Passed - Patient is not pregnant      Passed - Valid encounter within last 12 months    Recent Outpatient Visits           2 months ago Pure hypercholesterolemia  [place] [facility], Salvadore Oxford, NP   8 months ago Encounter for general adult medical examination with abnormal findings   [place] [facility], Salvadore Oxford, NP   11 months ago Hot flashes   [place] [facility] [place], Salvadore Oxford, NP   11 months ago Medication monitoring encounter   [facility] [place], Salvadore Oxford, NP   1 year ago Nail problem   [place] [facility], Salvadore Oxford, NP       Future Appointments             In 2 months Baity, Salvadore Oxford, NP  [facility]

## 2023-07-27 ENCOUNTER — Other Ambulatory Visit: Payer: Self-pay | Admitting: Obstetrics and Gynecology

## 2023-07-27 DIAGNOSIS — Z1231 Encounter for screening mammogram for malignant neoplasm of breast: Secondary | ICD-10-CM

## 2023-07-27 LAB — HM PAP SMEAR

## 2023-07-27 LAB — RESULTS CONSOLE HPV: CHL HPV: NEGATIVE

## 2023-07-31 ENCOUNTER — Encounter: Payer: Self-pay | Admitting: Urology

## 2023-07-31 ENCOUNTER — Ambulatory Visit (INDEPENDENT_AMBULATORY_CARE_PROVIDER_SITE_OTHER): Payer: 59 | Admitting: Urology

## 2023-07-31 VITALS — BP 114/82 | HR 92 | Ht 66.0 in | Wt 217.0 lb

## 2023-07-31 DIAGNOSIS — N3946 Mixed incontinence: Secondary | ICD-10-CM

## 2023-07-31 LAB — URINALYSIS, COMPLETE
Bilirubin, UA: NEGATIVE
Glucose, UA: NEGATIVE
Ketones, UA: NEGATIVE
Leukocytes,UA: NEGATIVE
Nitrite, UA: NEGATIVE
Protein,UA: NEGATIVE
RBC, UA: NEGATIVE
Specific Gravity, UA: <1.005 — ABNORMAL LOW (ref 1.005–1.030)
Urobilinogen, Ur: 0.2 mg/dL (ref 0.2–1.0)
pH, UA: 7 (ref 5.0–7.5)

## 2023-07-31 LAB — MICROSCOPIC EXAMINATION

## 2023-07-31 NOTE — Patient Instructions (Signed)

## 2023-07-31 NOTE — Progress Notes (Signed)
07/31/2023 10:07 AM   Martha Page Apr 30, 1981 [id number]  Referring provider: Lorre Munroe, NP [address] [place],  [place] [address]  Chief Complaint  Patient presents with   New Patient (Initial Visit)   Urinary Incontinence    HPI: I was consulted to assist the patient's urinary incontinence.  She leaks with coughing sneezing but not bending lifting.  She has urge incontinence.  She said the stress component was most severe but the history send it that she was bothered a lot by her urgency.  No bedwetting.  Changes special underwear twice a day that are damp  She saw urogynecologist at [facility] and did physical therapy and had urodynamics in 2020.  She failed oxybutynin causing dry mouth  She voids every 60 to 90 minutes and gets up once at night.  Flow was good  No hysterectomy and prone to constipation  No neurologic issues.  No history of kidney stones bladder surgery or bladder infections   PMH: Past Medical History:  Diagnosis Date   Acne    iPledge: [id number]   Anxiety    Dermatitis    eczema   Elevated liver enzymes    Frequent headaches    GERD (gastroesophageal reflux disease)    Hx of dysplastic nevus 04/02/2013   Left distal knee. Mild atypia, margins free   Migraine    2x/6 mos   Pneumonia 06/30/2016   has finished antibiotics.  still has lingering nighttime cough   Shoulder pain    Sleep apnea    uses CPAP    Surgical History: Past Surgical History:  Procedure Laterality Date   CESAREAN SECTION     CESAREAN SECTION     CHOLECYSTECTOMY  2003   COLONOSCOPY WITH PROPOFOL N/A 04/17/2015   Procedure: COLONOSCOPY WITH PROPOFOL;  Surgeon: Elnita Maxwell, MD;  Location[facility] END[facility];  Service: Endoscopy;  Laterality: N/A;   COLONOSCOPY WITH PROPOFOL N/A 01/10/2022   Procedure: COLONOSCOPY WITH PROPOFOL;  Surgeon: Midge Minium, MD;  Location: [facility];  Service: Endoscopy;  Laterality: N/A;   DIAGNOSTIC LAPAROSCOPY WITH REMOVAL  OF ECTOPIC PREGNANCY     DILATION AND CURETTAGE OF UTERUS     DUPUYTREN CONTRACTURE RELEASE     ECTOPIC PREGNANCY SURGERY     ESOPHAGOGASTRODUODENOSCOPY (EGD) WITH PROPOFOL N/A 07/22/2016   Procedure: ESOPHAGOGASTRODUODENOSCOPY (EGD) WITH PROPOFOL;  Surgeon: Midge Minium, MD;  Location: [facility] SUR[facility]ERY [facility];  Service: Endoscopy;  Laterality: N/A;   FOREIGN BODY REMOVAL N/A 01/10/2022   Procedure: FOREIGN BODY REMOVAL;  Surgeon: Midge Minium, MD;  Location: [facility];  Service: Endoscopy;  Laterality: N/A;  removal of rectal staple   GALLBLADDER SURGERY  2002   HEMORRHOID SURGERY     HEMORRHOID SURGERY N/A 06/05/2015   Procedure: HEMORRHOIDECTOMY;  Surgeon: Nadeen Landau, MD;  Location: [facility] ORS;  Service: General;  Laterality: N/A;   INTRAUTERINE DEVICE (IUD) INSERTION     LASIK     TUBAL LIGATION      Home Medications:  Allergies as of 07/31/2023       Reactions   Cefazolin Rash, Shortness Of Breath   Codeine Sulfate Itching   Red Dye #40 (allura Red) Hives   Sulfa Antibiotics Other (See Comments)   "Stomach pain"   Amoxicillin Rash   Keflex [cephalexin] Swelling, Rash   Penicillin V Potassium Rash        Medication List        Accurate as of July 31, 2023 10:07  AM. If you have any questions, ask your nurse or doctor.          STOP taking these medications    linaclotide 145 MCG Caps capsule Commonly known as: Linzess Stopped by: Lorin Picket A Maritta Kief   traZODone 50 MG tablet Commonly known as: DESYREL Stopped by: Lorin Picket A Karalee Hauter       TAKE these medications    buPROPion ER 100 MG 12 hr tablet Commonly known as: WELLBUTRIN SR Take 100 mg by mouth 2 (two) times daily.   cetirizine 10 MG tablet Commonly known as: ZYRTEC TAKE 1 TABLET BY MOUTH EVERY DAY   cyanocobalamin 1000 MCG/ML injection Commonly known as: VITAMIN B12 INJECT INTO THE MUSCLE EVERY 30 DAYS   ibuprofen 800 MG tablet Commonly known as: ADVIL TAKE 1 TABLET BY  MOUTH EVERY 8 HOURS AS NEEDED   LORazepam 0.5 MG tablet Commonly known as: ATIVAN Take 0.25 mg by mouth 2 (two) times daily as needed.   meloxicam 15 MG tablet Commonly known as: Mobic Take 1 tablet (15 mg total) by mouth daily.   Mirena (52 MG) 20 MCG/24HR Iud Generic drug: levonorgestrel   pantoprazole 40 MG tablet Commonly known as: PROTONIX TAKE 1 TABLET BY MOUTH TWICE A DAY   SUMAtriptan 100 MG tablet Commonly known as: IMITREX Take 1 tablet (100 mg total) by mouth every 2 (two) hours as needed for migraine. May repeat in 2 hours if headache persists or recurs.   venlafaxine 75 MG tablet Commonly known as: EFFEXOR Take 1 tablet every day by oral route.        Allergies:  Allergies  Allergen Reactions   Cefazolin Rash and Shortness Of Breath   Codeine Sulfate Itching   Red Dye #40 (Allura Red) Hives   Sulfa Antibiotics Other (See Comments)    "Stomach pain"   Amoxicillin Rash   Keflex [Cephalexin] Swelling and Rash   Penicillin V Potassium Rash    Family History: Family History  Problem Relation Age of Onset   Stroke Mother    Depression Mother    Interstitial cystitis Mother    Fibromyalgia Mother    Diabetes Mother    Post-traumatic stress disorder Mother    Anxiety disorder Mother    Heart disease Father    Throat cancer Father    Hypertension Father    Healthy Sister    Arthritis Paternal Grandmother    Lung cancer Paternal Grandmother    Heart disease Paternal Grandmother    Stroke Paternal Grandmother    Hypertension Paternal Grandmother    Aortic aneurysm Brother 87   Breast cancer Neg Hx     Social History:  reports that she quit smoking about 12 years ago. Her smoking use included cigarettes. She has been exposed to tobacco smoke. She has never used smokeless tobacco. She reports that she does not drink alcohol and does not use drugs.  ROS:                                        Physical Exam: BP 114/82    Pulse 92   Ht 5\' 6"  (1.676 m)   Wt 98.4 kg   BMI 35.02 kg/m   Constitutional:  Alert and oriented, No acute distress. HEENT: Cerulean AT, moist mucus membranes.  Trachea midline, no masses. Cardiovascular: No clubbing, cyanosis, or edema. Respiratory: Normal respiratory effort, no increased work of breathing.  GI: Abdomen is soft, nontender, nondistended, no abdominal masses GU: Mild grade 2 hypermobility the bladder neck and negative cough test with a mild cough.  No significant prolapse Skin: No rashes, bruises or suspicious lesions. Lymph: No cervical or inguinal adenopathy. Neurologic: Grossly intact, no focal deficits, moving all 4 extremities. Psychiatric: Normal mood and affect.  Laboratory Data: Lab Results  Component Value Date   WBC 8.6 05/22/2023   HGB 13.3 05/22/2023   HCT 40.5 05/22/2023   MCV 87.1 05/22/2023   PLT 121 (L) 05/22/2023    Lab Results  Component Value Date   CREATININE 0.81 05/22/2023    No results found for: "PSA"  No results found for: "TESTOSTERONE"  Lab Results  Component Value Date   HGBA1C 5.7 (H) 05/22/2023    Urinalysis    Component Value Date/Time   COLORURINE AMBER (A) 04/14/2015 1644   APPEARANCEUR CLEAR (A) 04/14/2015 1644   APPEARANCEUR Clear 05/26/2014 2035   LABSPEC 1.032 (H) 04/14/2015 1644   LABSPEC 1.024 05/26/2014 2035   PHURINE 6.0 04/14/2015 1644   GLUCOSEU NEGATIVE 04/14/2015 1644   GLUCOSEU Negative 05/26/2014 2035   HGBUR 2+ (A) 04/14/2015 1644   BILIRUBINUR NEGATIVE 04/14/2015 1644   BILIRUBINUR Negative 05/26/2014 2035   KETONESUR NEGATIVE 04/14/2015 1644   PROTEINUR 30 (A) 04/14/2015 1644   NITRITE NEGATIVE 04/14/2015 1644   LEUKOCYTESUR TRACE (A) 04/14/2015 1644   LEUKOCYTESUR Negative 05/26/2014 2035    Pertinent Imaging: Urine reviewed and sent for culture.  Chart reviewed  Assessment & Plan: Patient has mixed incontinence.  She has frequency and minimal nocturia.  Return for urodynamics and cystoscopy  and proceed accordingly  1. Urinary incontinence, mixed  - Urinalysis, Complete   No follow-ups on file.  Martina Sinner, MD  [facility] [address] [place], [place] [address] [phone]

## 2023-07-31 NOTE — Addendum Note (Signed)
Addended by: Sueanne Margarita on: 07/31/2023 10:34 AM   Modules accepted: Orders

## 2023-08-03 LAB — CULTURE, URINE COMPREHENSIVE

## 2023-08-20 ENCOUNTER — Other Ambulatory Visit: Payer: Self-pay | Admitting: Dermatology

## 2023-08-31 ENCOUNTER — Encounter: Payer: Self-pay | Admitting: Dermatology

## 2023-08-31 ENCOUNTER — Ambulatory Visit: Payer: 59 | Admitting: Dermatology

## 2023-08-31 DIAGNOSIS — L719 Rosacea, unspecified: Secondary | ICD-10-CM

## 2023-08-31 DIAGNOSIS — Z7189 Other specified counseling: Secondary | ICD-10-CM

## 2023-08-31 DIAGNOSIS — L709 Acne, unspecified: Secondary | ICD-10-CM

## 2023-08-31 DIAGNOSIS — H0266 Xanthelasma of left eye, unspecified eyelid: Secondary | ICD-10-CM

## 2023-08-31 DIAGNOSIS — L814 Other melanin hyperpigmentation: Secondary | ICD-10-CM

## 2023-08-31 DIAGNOSIS — Z86018 Personal history of other benign neoplasm: Secondary | ICD-10-CM

## 2023-08-31 DIAGNOSIS — Z1283 Encounter for screening for malignant neoplasm of skin: Secondary | ICD-10-CM

## 2023-08-31 DIAGNOSIS — Z79899 Other long term (current) drug therapy: Secondary | ICD-10-CM

## 2023-08-31 DIAGNOSIS — L219 Seborrheic dermatitis, unspecified: Secondary | ICD-10-CM

## 2023-08-31 DIAGNOSIS — D1801 Hemangioma of skin and subcutaneous tissue: Secondary | ICD-10-CM

## 2023-08-31 DIAGNOSIS — I781 Nevus, non-neoplastic: Secondary | ICD-10-CM

## 2023-08-31 DIAGNOSIS — W908XXA Exposure to other nonionizing radiation, initial encounter: Secondary | ICD-10-CM

## 2023-08-31 DIAGNOSIS — D229 Melanocytic nevi, unspecified: Secondary | ICD-10-CM

## 2023-08-31 DIAGNOSIS — L821 Other seborrheic keratosis: Secondary | ICD-10-CM

## 2023-08-31 DIAGNOSIS — L578 Other skin changes due to chronic exposure to nonionizing radiation: Secondary | ICD-10-CM

## 2023-08-31 DIAGNOSIS — H0264 Xanthelasma of left upper eyelid: Secondary | ICD-10-CM

## 2023-08-31 DIAGNOSIS — L739 Follicular disorder, unspecified: Secondary | ICD-10-CM

## 2023-08-31 DIAGNOSIS — L568 Other specified acute skin changes due to ultraviolet radiation: Secondary | ICD-10-CM

## 2023-08-31 MED ORDER — DOXYCYCLINE MONOHYDRATE 100 MG PO CAPS: 100.0000 mg | ORAL_CAPSULE | Freq: Two times a day (BID) | ORAL | 11 refills | Status: DC

## 2023-08-31 MED ORDER — KETOCONAZOLE 2 % EX SHAM: 1.0000 | MEDICATED_SHAMPOO | CUTANEOUS | 11 refills | Status: DC

## 2023-08-31 NOTE — Patient Instructions (Addendum)
Counseling for BBL / IPL / Laser and Coordination of Care Discussed the treatment option of Broad Band Light (BBL) /Intense Pulsed Light (IPL)/ Laser for skin discoloration, including brown spots and redness.  Typically we recommend at least 1-3 treatment sessions about 5-8 weeks apart for best results.  Cannot have tanned skin when BBL performed, and regular use of sunscreen/photoprotection is advised after the procedure to help maintain results. The patient's condition may also require "maintenance treatments" in the future.  The fee for BBL / laser treatments is $350 per treatment session for the whole face.  A fee can be quoted for other parts of the body.  Insurance typically does not pay for BBL/laser treatments and therefore the fee is an out-of-pocket cost. Recommend prophylactic valtrex treatment. Once scheduled for procedure, will send Rx in prior to patient's appointment.     Due to recent changes in healthcare laws, you may see results of your pathology and/or laboratory studies on MyChart before the doctors have had a chance to review them. We understand that in some cases there may be results that are confusing or concerning to you. Please understand that not all results are received at the same time and often the doctors may need to interpret multiple results in order to provide you with the best plan of care or course of treatment. Therefore, we ask that you please give [place] 2 business days to thoroughly review all your results before contacting the office for clarification. Should we see a critical lab result, you will be contacted sooner.   If You Need Anything After Your Visit  If you have any questions or concerns for your doctor, please call our main line at [phone] and press option 4 to reach your doctor's medical assistant. If no one answers, please leave a voicemail as directed and we will return your call as soon as possible. Messages left after 4 pm will be answered the  following business day.   You may also send [place] a message via MyChart. We typically respond to MyChart messages within 1-2 business days.  For prescription refills, please ask your pharmacy to contact our office. Our fax number is [phone].  If you have an urgent issue when the clinic is closed that cannot wait until the next business day, you can page your doctor at the number below.    Please note that while we do our best to be available for urgent issues outside of office hours, we are not available 24/7.   If you have an urgent issue and are unable to reach [place], you may choose to seek medical care at your doctor's office, retail clinic, urgent care center, or emergency room.  If you have a medical emergency, please immediately call 911 or go to the emergency department.  Pager Numbers  - Dr. Gwen Pounds: [phone]  - Dr. Roseanne Reno: [phone]  - Dr. Katrinka Blazing: [phone]   In the event of inclement weather, please call our main line at [phone] for an update on the status of any delays or closures.  Dermatology Medication Tips: Please keep the boxes that topical medications come in in order to help keep track of the instructions about where and how to use these. Pharmacies typically print the medication instructions only on the boxes and not directly on the medication tubes.   If your medication is too expensive, please contact our office at [phone] option 4 or send [place] a message through MyChart.   We are unable to tell what  your co-pay for medications will be in advance as this is different depending on your insurance coverage. However, we may be able to find a substitute medication at lower cost or fill out paperwork to get insurance to cover a needed medication.   If a prior authorization is required to get your medication covered by your insurance company, please allow [place] 1-2 business days to complete this process.  Drug prices often vary depending on where the  prescription is filled and some pharmacies may offer cheaper prices.  The website www.goodrx.com contains coupons for medications through different pharmacies. The prices here do not account for what the cost may be with help from insurance (it may be cheaper with your insurance), but the website can give you the price if you did not use any insurance.  - You can print the associated coupon and take it with your prescription to the pharmacy.  - You may also stop by our office during regular business hours and pick up a GoodRx coupon card.  - If you need your prescription sent electronically to a different pharmacy, notify our office through [facility] or by phone at [phone] option 4.     Si Usted Necesita Algo Despus de Su Visita  Tambin puede enviarnos un mensaje a travs de Clinical cytogeneticist. Por lo general respondemos a los mensajes de MyChart en el transcurso de 1 a 2 das hbiles.  Para renovar recetas, por favor pida a su farmacia que se ponga en contacto con nuestra oficina. Annie Sable de fax es [place] [phone].  Si tiene un asunto urgente cuando la clnica est cerrada y que no puede esperar hasta el siguiente da hbil, puede llamar/localizar a su doctor(a) al nmero que aparece a continuacin.   Por favor, tenga en cuenta que aunque hacemos todo lo posible para estar disponibles para asuntos urgentes fuera del horario de [place], no estamos disponibles las 24 horas del da, los 7 [address]  [address] de la [place].   Si tiene un problema urgente y no puede comunicarse con nosotros, puede optar por buscar atencin mdica  en el consultorio de su doctor(a), en una clnica privada, en un centro de atencin urgente o en una sala de emergencias.  Si tiene Engineer, drilling, por favor llame inmediatamente al 911 o vaya a la sala de emergencias.  Nmeros de bper  - Dr. Gwen Pounds: [phone]  - Dra. Roseanne Reno: [phone]  - Dr. Katrinka Blazing: [phone]   En caso de inclemencias del  tiempo, por favor llame a Lacy Duverney principal al [phone] para una actualizacin sobre el [place] de cualquier retraso o cierre.  Consejos para la medicacin en dermatologa: Por favor, guarde las cajas en las que vienen los medicamentos de uso tpico para ayudarle a seguir las instrucciones sobre dnde y cmo usarlos. Las farmacias generalmente imprimen las instrucciones del medicamento slo en las cajas y no directamente en los tubos del [place].   Si su medicamento es muy caro, por favor, pngase en contacto con Rolm Gala llamando al [phone] y presione la opcin 4 o envenos un mensaje a travs de Clinical cytogeneticist.   No podemos decirle cul ser su copago por los medicamentos por adelantado ya que esto es diferente dependiendo de la cobertura de su seguro. Sin embargo, es posible que podamos encontrar un medicamento sustituto a Audiological scientist un formulario para que el seguro cubra el medicamento que se considera necesario.   Si se requiere una autorizacin previa para que su compaa de seguros [place] su  medicamento, por favor permtanos de 1 a 2 das hbiles para completar [address]  Los precios de los medicamentos varan con frecuencia dependiendo del Environmental consultant de dnde se surte la receta y alguna farmacias pueden ofrecer precios ms baratos.  El sitio web www.goodrx.com tiene cupones para medicamentos de Health and safety inspector. Los precios aqu no tienen en cuenta lo que podra costar con la ayuda del seguro (puede ser ms barato con su seguro), pero el sitio web puede darle el precio si no utiliz Tourist information centre manager.  - Puede imprimir el cupn correspondiente y llevarlo con su receta a la farmacia.  - Tambin puede pasar por nuestra oficina durante el horario de atencin regular y Education officer, museum una tarjeta de cupones de GoodRx.  - Si necesita que su receta se enve electrnicamente a una farmacia diferente, informe a nuestra oficina a travs de MyChart de [place] o por telfono  llamando al [phone] y presione la opcin 4.

## 2023-08-31 NOTE — Progress Notes (Signed)
Follow-Up Visit   Subjective  Martha Page is a 42 y.o. female who presents for the following: Skin Cancer Screening and Full Body Skin Exam hx of Dysplastic Nevus, Rosacea face, Doxycycline 20mg  1 po bid, SM metronidazole/ivermectin/azelaic Acid bid, Seb derm scalp, Ketoconazole 2% shampoo 3x/wk, Folliculitis chest, back, Ketoconazole 2% shampoo 3x/wk, Doxycycline 20mg  1 po bid, check growths L upper eyelid and L medial canthus ~77yr, check dark spot  L infraocular, hx of sunburn on L ankle and is still red  The patient presents for Total-Body Skin Exam (TBSE) for skin cancer screening and mole check. The patient has spots, moles and lesions to be evaluated, some may be new or changing and the patient may have concern these could be cancer.  Exam of nails limited by presence of nail polish.     The following portions of the chart were reviewed this encounter and updated as appropriate: medications, allergies, medical history  Review of Systems:  No other skin or systemic complaints except as noted in HPI or Assessment and Plan.  Objective  Well appearing patient in no apparent distress; mood and affect are within normal limits.  A full examination was performed including scalp, head, eyes, ears, nose, lips, neck, chest, axillae, abdomen, back, buttocks, bilateral upper extremities, bilateral lower extremities, hands, feet, fingers, toes, fingernails, and toenails. All findings within normal limits unless otherwise noted below.   Relevant physical exam findings are noted in the Assessment and Plan.    Assessment & Plan   SKIN CANCER SCREENING PERFORMED TODAY.  ACTINIC DAMAGE - Chronic condition, secondary to cumulative UV/sun exposure - diffuse scaly erythematous macules with underlying dyspigmentation - Recommend daily broad spectrum sunscreen SPF 30+ to sun-exposed areas, reapply every 2 hours as needed.  - Staying in the shade or wearing long sleeves, sun glasses (UVA+UVB  protection) and wide brim hats (4-inch brim around the entire circumference of the hat) are also recommended for sun protection.  - Call for new or changing lesions.  LENTIGINES, SEBORRHEIC KERATOSES, HEMANGIOMAS - Benign normal skin lesions - Benign-appearing - Call for any changes  MELANOCYTIC NEVI - Tan-brown and/or pink-flesh-colored symmetric macules and papules - Benign appearing on exam today - Observation - Call clinic for new or changing moles - Recommend daily use of broad spectrum spf 30+ sunscreen to sun-exposed areas.   ROSACEA, complicated by phototoxic dermatitis  face Exam Diffuse erythema of face and scaling of hairline  Chronic and persistent condition with duration or expected duration over one year. Condition is symptomatic/ bothersome to patient. Not currently at goal.   Rosacea is a chronic progressive skin condition usually affecting the face of adults, causing redness and/or acne bumps. It is treatable but not curable. It sometimes affects the eyes (ocular rosacea) as well. It may respond to topical and/or systemic medication and can flare with stress, sun exposure, alcohol, exercise, topical steroids (including hydrocortisone/cortisone 10) and some foods.  Daily application of broad spectrum spf 30+ sunscreen to face is recommended to reduce flares.   Treatment Plan Counseling for BBL / IPL / Laser and Coordination of Care Discussed the treatment option of Broad Band Light (BBL) /Intense Pulsed Light (IPL)/ Laser for skin discoloration, including brown spots and redness.  Typically we recommend at least 1-3 treatment sessions about 5-8 weeks apart for best results.  Cannot have tanned skin when BBL performed, and regular use of sunscreen/photoprotection is advised after the procedure to help maintain results. The patient's condition may also require "maintenance  treatments" in the future.  The fee for BBL / laser treatments is $350 per treatment session for the  whole face.  A fee can be quoted for other parts of the body.  Insurance typically does not pay for BBL/laser treatments and therefore the fee is an out-of-pocket cost. Recommend prophylactic valtrex treatment. Once scheduled for procedure, will send Rx in prior to patient's appointment.   D/C Doxycycline 20mg  Increase Doxycycline to 100mg  1 po bid with food and drink Cont SM Triple Cream qd/bid to face Discussed subtypes of rosacea. Avoid triggers  Long term medication management.  Patient is using long term (months to years) prescription medication  to control their dermatologic condition.  These medications require periodic monitoring to evaluate for efficacy and side effects and may require periodic laboratory monitoring.  Doxycycline should be taken with food to prevent nausea. Do not lay down for 30 minutes after taking. Be cautious with sun exposure and use good sun protection while on this medication. Pregnant women should not take this medication.   SEBORRHEIC DERMATITIS scalp Exam: clear scalp  Chronic and persistent condition with duration or expected duration over one year. Well-controlled. at goal.   Seborrheic Dermatitis is a chronic persistent rash characterized by pinkness and scaling most commonly of the mid face but also can occur on the scalp (dandruff), ears; mid chest, mid back and groin.  It tends to be exacerbated by stress and cooler weather.  People who have neurologic disease may experience new onset or exacerbation of existing seborrheic dermatitis.  The condition is not curable but treatable and can be controlled.  Treatment Plan: Cont Ketoconazole 2% shampoo 3x/wk, let sit 5-10 minutes and rinse out    Long term medication management.  Patient is using long term (months to years) prescription medication  to control their dermatologic condition.  These medications require periodic monitoring to evaluate for efficacy and side effects and may require periodic  laboratory monitoring.   ACNE / FOLLICULITIS, chronic flaring not at goal Chest, back Exam: Perifollicular erythematous papules and pustules on trunk  Folliculitis occurs due to inflammation of the superficial hair follicle (pore), resulting in acne-like lesions (pus bumps). It can be infectious (bacterial, fungal) or noninfectious (shaving, tight clothing, heat/sweat, medications).  Folliculitis can be acute or chronic and recommended treatment depends on the underlying cause of folliculitis.  Treatment Plan: Cont Ketoconazole 2% shampoo washing chest and back 3x/wk, let sit 5-10 minutes Increase Doxycycline to 100mg  1 po bid with food and drink  Doxycycline should be taken with food to prevent nausea. Do not lay down for 30 minutes after taking. Be cautious with sun exposure and use good sun protection while on this medication. Pregnant women should not take this medication.     Long term medication management.  Patient is using long term (months to years) prescription medication  to control their dermatologic condition.  These medications require periodic monitoring to evaluate for efficacy and side effects and may require periodic laboratory monitoring.  HISTORY OF DYSPLASTIC NEVUS No evidence of recurrence today Recommend regular full body skin exams Recommend daily broad spectrum sunscreen SPF 30+ to sun-exposed areas, reapply every 2 hours as needed.  Call if any new or changing lesions are noted between office visits  - L distal knee  TELANGIECTASIA L chest, distal lower legs b/l Exam: dilated blood vessel(s)  Treatment Plan: Benign appearing on exam Call for changes   XANTHELASMA L upper eyelid, L medial canthus Exam: yellow plaques of medial periorbital  skin of left eye  Treatment Plan: Discussed excising, will discuss with Dr. Caralyn Guile Discussed association with hyperlipidemia. 05/22/23 lipid panel normal   Return in about 1 year (around 08/30/2024) for TBSE, Hx of  Dysplastic nevi.  I, Ardis Rowan, RMA, am acting as scribe for Elie Goody, MD .   Documentation: I have reviewed the above documentation for accuracy and completeness, and I agree with the above.  Elie Goody, MD

## 2023-09-04 ENCOUNTER — Encounter: Payer: Self-pay | Admitting: Urology

## 2023-09-04 ENCOUNTER — Ambulatory Visit: Payer: 59 | Admitting: Urology

## 2023-09-04 VITALS — BP 125/83 | HR 98

## 2023-09-04 DIAGNOSIS — N3946 Mixed incontinence: Secondary | ICD-10-CM

## 2023-09-04 LAB — MICROSCOPIC EXAMINATION

## 2023-09-04 LAB — URINALYSIS, COMPLETE
Bilirubin, UA: NEGATIVE
Glucose, UA: NEGATIVE
Ketones, UA: NEGATIVE
Nitrite, UA: NEGATIVE
Protein,UA: NEGATIVE
Specific Gravity, UA: <1.005 — ABNORMAL LOW (ref 1.005–1.030)
Urobilinogen, Ur: 0.2 mg/dL (ref 0.2–1.0)
pH, UA: 5.5 (ref 5.0–7.5)

## 2023-09-04 MED ORDER — GEMTESA 75 MG PO TABS: 75.0000 mg | ORAL_TABLET | Freq: Every day | ORAL | 0 refills | Status: DC

## 2023-09-04 NOTE — Progress Notes (Signed)
09/04/2023 11:15 AM   Martha Page Jun 15, 1981 [id number]  Referring provider: Lorre Munroe, NP [address] [place],  [place] [address]  Chief Complaint  Patient presents with   Cysto    HPI: I was consulted to assist the patient's urinary incontinence.  She leaks with coughing sneezing but not bending lifting.  She has urge incontinence.  She said the stress component was most severe but the history send it that she was bothered a lot by her urgency.  No bedwetting.  Changes special underwear twice a day that are damp   She saw urogynecologist at [facility] and did physical therapy and had urodynamics in 2020.  She failed oxybutynin causing dry mouth   She voids every 60 to 90 minutes and gets up once at night.  Flow was good   No hysterectomy and prone to constipation    Mild grade 2 hypermobility the bladder neck and negative cough test with a mild cough. No significant prolapse  Patient has mixed incontinence. She has frequency and minimal nocturia. Return for urodynamics and cystoscopy and proceed accordingly    Today Frequency stable.  Incontinence stable. On urodynamics she initially voided 21 mL with a maximal flow 27 mL/s and was catheterized for a few milliliters.  He had a prolonged mildly interrupted pattern maximum bladder capacity was 403 mL.  Bladder was stable.  Her cough leak point pressure 200 mL was 46 cm water with mild leakage and the Valsalva leak point pressure at the same volume was 74 cm of water with moderate leakage.  At 300 mL her cuff leak point pressure was 39 cm of water with moderate leakage and her Valsalva leak point pressure at the same volume was 66 cm of water with moderate leakage.  During voiding she voided 345 mL with a max flow of 17 mL/s.  Max voiding pressure was 20 cm of water.  It was a prolonged pattern.  Residual was 58 mL.  EMG activity decreased with voiding.  Her neck descent approximately 1 cm the details of the urodynamics are  signed dictated  On pelvic examination patient had grade 2 hypermobility the bladder neck and negative cough test after cystoscopy.  She had a very small cystocele  Cystoscopy: Patient underwent flexible cystoscopy.  Bladder mucosa and trigone were normal.  No cystitis no carcinoma she tolerated the procedure well   PMH: Past Medical History:  Diagnosis Date   Acne    iPledge: [id number]   Anxiety    Dermatitis    eczema   Elevated liver enzymes    Frequent headaches    GERD (gastroesophageal reflux disease)    Hx of dysplastic nevus 04/02/2013   Left distal knee. Mild atypia, margins free   Migraine    2x/6 mos   Pneumonia 06/30/2016   has finished antibiotics.  still has lingering nighttime cough   Shoulder pain    Sleep apnea    uses CPAP    Surgical History: Past Surgical History:  Procedure Laterality Date   CESAREAN SECTION     CESAREAN SECTION     CHOLECYSTECTOMY  2003   COLONOSCOPY WITH PROPOFOL N/A 04/17/2015   Procedure: COLONOSCOPY WITH PROPOFOL;  Surgeon: Elnita Maxwell, MD;  Location: [facility] ENDOSCOPY;  Service: Endoscopy;  Laterality: N/A;   COLONOSCOPY WITH PROPOFOL N/A 01/10/2022   Procedure: COLONOSCOPY WITH PROPOFOL;  Surgeon: Midge Minium, MD;  Location: [facility] SURGERY CNTR;  Service: Endoscopy;  Laterality: N/A;   DIAGNOSTIC LAPAROSCOPY  WITH REMOVAL OF ECTOPIC PREGNANCY     DILATION AND CURETTAGE OF UTERUS     DUPUYTREN CONTRACTURE RELEASE     ECTOPIC PREGNANCY SURGERY     ESOPHAGOGASTRODUODENOSCOPY (EGD) WITH PROPOFOL N/A 07/22/2016   Procedure: ESOPHAGOGASTRODUODENOSCOPY (EGD) WITH PROPOFOL;  Surgeon: Midge Minium, MD;  Location: [facility] SUR[facility]ERY [facility];  Service: Endoscopy;  Laterality: N/A;   FOREIGN BODY REMOVAL N/A 01/10/2022   Procedure: FOREIGN BODY REMOVAL;  Surgeon: Midge Minium, MD;  Location: [facility];  Service: Endoscopy;  Laterality: N/A;  removal of rectal staple   GALLBLADDER SURGERY  2002   HEMORRHOID SURGERY     HEMORRHOID  SURGERY N/A 06/05/2015   Procedure: HEMORRHOIDECTOMY;  Surgeon: Nadeen Landau, MD;  Location: [facility] ORS;  Service: General;  Laterality: N/A;   INTRAUTERINE DEVICE (IUD) INSERTION     LASIK     TUBAL LIGATION      Home Medications:  Allergies as of 09/04/2023       Reactions   Cefazolin Rash, Shortness Of Breath   Codeine Sulfate Itching   Red Dye #40 (allura Red) Hives   Sulfa Antibiotics Other (See Comments)   "Stomach pain"   Amoxicillin Rash   Keflex [cephalexin] Swelling, Rash   Penicillin V Potassium Rash        Medication List        Accurate as of September 04, 2023 11:15 AM. If you have any questions, ask your nurse or doctor.          buPROPion ER 100 MG 12 hr tablet Commonly known as: WELLBUTRIN SR Take 100 mg by mouth 2 (two) times daily.   cetirizine 10 MG tablet Commonly known as: ZYRTEC TAKE 1 TABLET BY MOUTH EVERY DAY   cyanocobalamin 1000 MCG/ML injection Commonly known as: VITAMIN B12 INJECT INTO THE MUSCLE EVERY 30 DAYS   doxycycline 100 MG capsule Commonly known as: MONODOX Take 1 capsule (100 mg total) by mouth 2 (two) times daily. Take with food and drink   ibuprofen 800 MG tablet Commonly known as: ADVIL TAKE 1 TABLET BY MOUTH EVERY 8 HOURS AS NEEDED   ketoconazole 2 % shampoo Commonly known as: NIZORAL Apply 1 Application topically 3 (three) times a week. Wash scalp, chest, back 3 times weekly, let sit 5-10 minutes and rinse out   LORazepam 0.5 MG tablet Commonly known as: ATIVAN Take 0.25 mg by mouth 2 (two) times daily as needed.   Mirena (52 MG) 20 MCG/24HR Iud Generic drug: levonorgestrel   pantoprazole 40 MG tablet Commonly known as: PROTONIX TAKE 1 TABLET BY MOUTH TWICE A DAY   SUMAtriptan 100 MG tablet Commonly known as: IMITREX Take 1 tablet (100 mg total) by mouth every 2 (two) hours as needed for migraine. May repeat in 2 hours if headache persists or recurs.   venlafaxine 75 MG tablet Commonly known as:  EFFEXOR Take 1 tablet every day by oral route.        Allergies:  Allergies  Allergen Reactions   Cefazolin Rash and Shortness Of Breath   Codeine Sulfate Itching   Red Dye #40 (Allura Red) Hives   Sulfa Antibiotics Other (See Comments)    "Stomach pain"   Amoxicillin Rash   Keflex [Cephalexin] Swelling and Rash   Penicillin V Potassium Rash    Family History: Family History  Problem Relation Age of Onset   Stroke Mother    Depression Mother    Interstitial cystitis Mother    Fibromyalgia Mother  Diabetes Mother    Post-traumatic stress disorder Mother    Anxiety disorder Mother    Heart disease Father    Throat cancer Father    Hypertension Father    Healthy Sister    Arthritis Paternal Grandmother    Lung cancer Paternal Grandmother    Heart disease Paternal Grandmother    Stroke Paternal Grandmother    Hypertension Paternal Grandmother    Aortic aneurysm Brother 12   Breast cancer Neg Hx     Social History:  reports that she quit smoking about 12 years ago. Her smoking use included cigarettes. She has been exposed to tobacco smoke. She has never used smokeless tobacco. She reports that she does not drink alcohol and does not use drugs.  ROS:                                        Physical Exam: There were no vitals taken for this visit.  Constitutional:  Alert and oriented, No acute distress. HEENT: [place] AT, moist mucus membranes.  Trachea midline, no masses.   Laboratory Data: Lab Results  Component Value Date   WBC 8.6 05/22/2023   HGB 13.3 05/22/2023   HCT 40.5 05/22/2023   MCV 87.1 05/22/2023   PLT 121 (L) 05/22/2023    Lab Results  Component Value Date   CREATININE 0.81 05/22/2023    No results found for: "PSA"  No results found for: "TESTOSTERONE"  Lab Results  Component Value Date   HGBA1C 5.7 (H) 05/22/2023    Urinalysis    Component Value Date/Time   COLORURINE AMBER (A) 04/14/2015 1644    APPEARANCEUR Clear 07/31/2023 1001   LABSPEC 1.032 (H) 04/14/2015 1644   LABSPEC 1.024 05/26/2014 2035   PHURINE 6.0 04/14/2015 1644   GLUCOSEU Negative 07/31/2023 1001   GLUCOSEU Negative 05/26/2014 2035   HGBUR 2+ (A) 04/14/2015 1644   BILIRUBINUR Negative 07/31/2023 1001   BILIRUBINUR Negative 05/26/2014 2035   KETONESUR NEGATIVE 04/14/2015 1644   PROTEINUR Negative 07/31/2023 1001   PROTEINUR 30 (A) 04/14/2015 1644   NITRITE Negative 07/31/2023 1001   NITRITE NEGATIVE 04/14/2015 1644   LEUKOCYTESUR Negative 07/31/2023 1001   LEUKOCYTESUR Negative 05/26/2014 2035    Pertinent Imaging: Urine reviewed and sent for culture  Assessment & Plan: Patient has mixed incontinence but the stress component appears to be more severe.  She has done physical therapy in the past.  Recommended for her to try Gemtesa samples and prescription and if she is not dramatically better discussed sling versus bulking agent next visit.  A trial with Myrbetriq would also be very reasonable  The patient agreed with the treatment plan but her deductible has been met and she is hoping if she needs surgery she would like to have it before the end of the year.  She will see Dr. Arita Miss and I will try to expedite the visit.  Referral was sent.  I will scanned the notes.  She will start the Gemtesa samples and would be interested in discussing a sling and or bulking agent.  Reason for referral discussed  1. Urinary incontinence, mixed  - Urinalysis, Complete   No follow-ups on file.  Martina Sinner, MD  [facility] [address], [address] [place], [place] [address] [phone]

## 2023-09-07 ENCOUNTER — Other Ambulatory Visit: Payer: Self-pay | Admitting: Internal Medicine

## 2023-09-07 ENCOUNTER — Other Ambulatory Visit: Payer: Self-pay | Admitting: Dermatology

## 2023-09-07 NOTE — Telephone Encounter (Signed)
Requested medication (s) are due for refill today:   Yes  Requested medication (s) are on the active medication list:   Yes  Future visit scheduled:   Yes 12/4   Last ordered: 04/17/2023 1 ml, 4 refills  Unable to refill because B12 is due per protocol.      Requested Prescriptions  Pending Prescriptions Disp Refills   cyanocobalamin (VITAMIN B12) 1000 MCG/ML injection [Pharmacy Med Name: CYANOCOBALAMIN 1,000 MCG/ML VL] 1 mL 4    Sig: INJECT INTO THE MUSCLE EVERY 30 DAYS     Endocrinology:  Vitamins - Vitamin B12 Failed - 09/07/2023  9:12 AM      Failed - B12 Level in normal range and within 360 days    Vitamin B-12  Date Value Ref Range Status  08/24/2022 273 200 - 1,100 pg/mL Final    Comment:    . Please Note: Although the reference range for vitamin B12 is 630 304 9819 pg/mL, it has been reported that between 5 and 10% of patients with values between 200 and 400 pg/mL may experience neuropsychiatric and hematologic abnormalities due to occult B12 deficiency; less than 1% of patients with values above 400 pg/mL will have symptoms. .          Passed - HCT in normal range and within 360 days    HCT  Date Value Ref Range Status  05/22/2023 40.5 35.0 - 45.0 % Final   Hematocrit  Date Value Ref Range Status  12/02/2021 39.5 34.0 - 46.6 % Final         Passed - HGB in normal range and within 360 days    Hemoglobin  Date Value Ref Range Status  05/22/2023 13.3 11.7 - 15.5 g/dL Final  47/82/9562 13.0 11.1 - 15.9 g/dL Final         Passed - Valid encounter within last 12 months    Recent Outpatient Visits           3 months ago Pure hypercholesterolemia   [place] [facility] [place], Salvadore Oxford, NP   10 months ago Encounter for general adult medical examination with abnormal findings   [place] [facility] [place], Salvadore Oxford, NP   1 year ago Hot flashes   [place] [facility] [place], Salvadore Oxford, NP   1 year ago  Medication monitoring encounter   [facility] [place], Salvadore Oxford, NP   1 year ago Nail problem   [place] [facility] [place], Salvadore Oxford, NP       Future Appointments             In 1 month Yoder, Salvadore Oxford, NP [place] [facility] [place]   In 1 year Deirdre Evener, MD [facility]

## 2023-09-08 LAB — CULTURE, URINE COMPREHENSIVE

## 2023-09-11 ENCOUNTER — Ambulatory Visit: Payer: 59 | Admitting: Podiatry

## 2023-09-11 ENCOUNTER — Encounter: Payer: Self-pay | Admitting: Dermatology

## 2023-09-11 DIAGNOSIS — H0260 Xanthelasma of unspecified eye, unspecified eyelid: Secondary | ICD-10-CM

## 2023-09-19 ENCOUNTER — Ambulatory Visit (INDEPENDENT_AMBULATORY_CARE_PROVIDER_SITE_OTHER): Payer: 59 | Admitting: Physician Assistant

## 2023-09-19 ENCOUNTER — Encounter (INDEPENDENT_AMBULATORY_CARE_PROVIDER_SITE_OTHER): Payer: Self-pay | Admitting: Physician Assistant

## 2023-09-19 VITALS — BP 95/66 | HR 94 | Temp 98.3°F | Ht 65.5 in | Wt 219.0 lb

## 2023-09-19 DIAGNOSIS — E7800 Pure hypercholesterolemia, unspecified: Secondary | ICD-10-CM | POA: Diagnosis not present

## 2023-09-19 DIAGNOSIS — G4733 Obstructive sleep apnea (adult) (pediatric): Secondary | ICD-10-CM | POA: Diagnosis not present

## 2023-09-19 DIAGNOSIS — F419 Anxiety disorder, unspecified: Secondary | ICD-10-CM

## 2023-09-19 DIAGNOSIS — K5909 Other constipation: Secondary | ICD-10-CM

## 2023-09-19 DIAGNOSIS — Z6836 Body mass index (BMI) 36.0-36.9, adult: Secondary | ICD-10-CM

## 2023-09-19 DIAGNOSIS — F5104 Psychophysiologic insomnia: Secondary | ICD-10-CM | POA: Diagnosis not present

## 2023-09-19 DIAGNOSIS — Z0289 Encounter for other administrative examinations: Secondary | ICD-10-CM

## 2023-09-19 DIAGNOSIS — R7303 Prediabetes: Secondary | ICD-10-CM | POA: Diagnosis not present

## 2023-09-19 DIAGNOSIS — K219 Gastro-esophageal reflux disease without esophagitis: Secondary | ICD-10-CM

## 2023-09-19 DIAGNOSIS — E66812 Obesity, class 2: Secondary | ICD-10-CM

## 2023-09-19 DIAGNOSIS — F32A Depression, unspecified: Secondary | ICD-10-CM

## 2023-09-19 NOTE — Progress Notes (Signed)
Office: [phone]  /  Fax: [phone]   Initial Visit  Martha Page was seen in clinic today to evaluate for obesity. She is interested in losing weight to improve overall health and reduce the risk of weight related complications. She presents today to review program treatment options, initial physical assessment, and evaluation.     She was referred by: PCP  When asked what else they would like to accomplish? She states: Adopt healthier eating patterns, Improve energy levels and physical activity, Improve existing medical conditions, Reduce number of medications, Improve quality of life, Improve appearance, Improve self-confidence, and Lose a target amount of weight : 10 + lbs in 3 months.  Weight history: Gained weight when had triplets in 2016.    Gained weight following this pregnancy.    When asked how has your weight affected you? She states: Has affected self-esteem, Relationships, Contributed to medical problems, Contributed to orthopedic problems or mobility issues, Having fatigue, Having poor endurance, Problems with eating patterns, and Has affected mood   Some associated conditions: Arthritis:knee, Fatty liver disease, OSA, Prediabetes, GERD, Overactive bladder, Vitamin D Deficiency, and Other: B 12 deficiency  Contributing factors: Family history of obesity, Disruption of circadian rhythm / sleep disordered breathing, Consumption of highly palatable foods, Use of obesogenic medications: Psychotropic medications, Moderate to high levels of stress, Reduced physical activity, Eating patterns, Mental health problems, Slow metabolism for age, and Enticing relationships and enviroment  Weight promoting medications identified: Psychotropic medications  Current nutrition plan: None  Current level of physical activity: None, Limited due to chronic pain or orthopedic problems, and Other: overactive bladder  Current or previous pharmacotherapy: Phentermine last year x several  months. 222 down to 189 lbs  Response to medication: Lost weight initially but was unable to sustain weight loss   Past medical history includes:   Past Medical History:  Diagnosis Date   Acne    iPledge: [phone]   Anxiety    Dermatitis    eczema   Elevated liver enzymes    Frequent headaches    GERD (gastroesophageal reflux disease)    Hx of dysplastic nevus 04/02/2013   Left distal knee. Mild atypia, margins free   Migraine    2x/6 mos   Pneumonia 06/30/2016   has finished antibiotics.  still has lingering nighttime cough   Shoulder pain    Sleep apnea    uses CPAP     Objective:   BP 95/66   Pulse 94   Temp 98.3 F (36.8 C)   Ht 5' 5.5" (1.664 m)   Wt 219 lb (99.3 kg)   SpO2 97%   BMI 35.89 kg/m  She was weighed on the bioimpedance scale: Body mass index is 35.89 kg/m.  Peak Weight:222 lbs , Body Fat%:44.3 % , Visceral Fat Rating:11, Weight trend over the last 12 months: Increasing  General:  Alert, oriented and cooperative. Patient is in no acute distress.  Respiratory: Normal respiratory effort, no problems with respiration noted   Gait: able to ambulate independently  Mental Status: Normal mood and affect. Normal behavior. Normal judgment and thought content.   DIAGNOSTIC DATA REVIEWED:  BMET    Component Value Date/Time   NA 136 05/22/2023 1015   NA 137 12/02/2021 1557   NA 137 05/26/2014 1855   K 4.3 05/22/2023 1015   K 3.3 (L) 05/26/2014 1855   CL 104 05/22/2023 1015   CL 105 05/26/2014 1855   CO2 24 05/22/2023 1015   CO2 20 (L)  05/26/2014 1855   GLUCOSE 92 05/22/2023 1015   GLUCOSE 131 (H) 05/26/2014 1855   BUN 10 05/22/2023 1015   BUN 12 12/02/2021 1557   BUN 5 (L) 05/26/2014 1855   CREATININE 0.81 05/22/2023 1015   CALCIUM 9.6 05/22/2023 1015   CALCIUM 8.4 (L) 05/26/2014 1855   GFRNONAA >60 01/24/2018 1543   GFRNONAA >60 05/26/2014 1855   GFRAA >60 01/24/2018 1543   GFRAA >60 05/26/2014 1855   Lab Results  Component Value Date    HGBA1C 5.7 (H) 05/22/2023   HGBA1C 5.3 10/16/2015   No results found for: "INSULIN" CBC    Component Value Date/Time   WBC 8.6 05/22/2023 1015   RBC 4.65 05/22/2023 1015   HGB 13.3 05/22/2023 1015   HGB 13.5 12/02/2021 1557   HCT 40.5 05/22/2023 1015   HCT 39.5 12/02/2021 1557   PLT 121 (L) 05/22/2023 1015   PLT 227 12/02/2021 1557   MCV 87.1 05/22/2023 1015   MCV 83 12/02/2021 1557   MCV 87 05/26/2014 1855   MCH 28.6 05/22/2023 1015   MCHC 32.8 05/22/2023 1015   RDW 13.1 05/22/2023 1015   RDW 13.5 12/02/2021 1557   RDW 18.2 (H) 05/26/2014 1855   Iron/TIBC/Ferritin/ %Sat No results found for: "IRON", "TIBC", "FERRITIN", "IRONPCTSAT" Lipid Panel     Component Value Date/Time   CHOL 165 05/22/2023 1015   TRIG 114 05/22/2023 1015   HDL 50 05/22/2023 1015   CHOLHDL 3.3 05/22/2023 1015   VLDL 13.4 10/16/2015 0910   LDLCALC 94 05/22/2023 1015   Hepatic Function Panel     Component Value Date/Time   PROT 7.9 05/22/2023 1015   PROT 7.4 12/02/2021 1557   PROT 7.5 12/02/2013 1017   ALBUMIN 4.4 12/02/2021 1557   ALBUMIN 3.4 12/02/2013 1017   AST 14 05/22/2023 1015   AST 21 12/02/2013 1017   ALT 24 05/22/2023 1015   ALT 49 12/02/2013 1017   ALKPHOS 121 12/02/2021 1557   ALKPHOS 90 12/02/2013 1017   BILITOT 0.4 05/22/2023 1015   BILITOT 0.4 12/02/2021 1557   BILITOT 0.3 12/02/2013 1017   BILIDIR 0.1 01/27/2017 0844      Component Value Date/Time   TSH 1.44 08/24/2022 1123     Assessment and Plan:   OSA (obstructive sleep apnea)  Prediabetes  Pure hypercholesterolemia  Psychophysiological insomnia  Chronic constipation  Anxiety and depression  Gastroesophageal reflux disease without esophagitis  Class 2 severe obesity due to excess calories with serious comorbidity and body mass index (BMI) of 36.0 to 36.9 in adult [facility])        Obesity Treatment / Action Plan:  Patient will work on garnering support from family and friends to begin weight loss  journey. Will work on eliminating or reducing the presence of highly palatable, calorie dense foods in the home. Will complete provided nutritional and psychosocial assessment questionnaire before the next appointment. Will be scheduled for indirect calorimetry to determine resting energy expenditure in a fasting state.  This will allow [place] to create a reduced calorie, high-protein meal plan to promote loss of fat mass while preserving muscle mass. Will think about ideas on how to incorporate physical activity into their daily routine. Will reduce the frequency of eating out and making healthier choices by advanced menu planning. Will work on managing stress via relaxation methods as this may result in unhealthy eating patterns. Counseled on the health benefits of losing 5%-15% of total body weight. Was counseled on nutritional approaches to weight  loss and benefits of reducing processed foods and consuming plant-based foods and high quality protein as part of nutritional weight management. Was counseled on pharmacotherapy and role as an adjunct in weight management.   Obesity Education Performed Today:  She was weighed on the bioimpedance scale and results were discussed and documented in the synopsis.  We discussed obesity as a disease and the importance of a more detailed evaluation of all the factors contributing to the disease.  We discussed the importance of long term lifestyle changes which include nutrition, exercise and behavioral modifications as well as the importance of customizing this to her specific health and social needs.  We discussed the benefits of reaching a healthier weight to alleviate the symptoms of existing conditions and reduce the risks of the biomechanical, metabolic and psychological effects of obesity.  Martha Page appears to be in the action stage of change and states they are ready to start intensive lifestyle modifications and behavioral modifications.  30  minutes was spent today on this visit including the above counseling, pre-visit chart review, and post-visit documentation.  Reviewed by clinician on day of visit: allergies, medications, problem list, medical history, surgical history, family history, social history, and previous encounter notes pertinent to obesity diagnosis.   Jalyric Kaestner,PA-C

## 2023-09-20 ENCOUNTER — Other Ambulatory Visit: Payer: Self-pay | Admitting: Urology

## 2023-09-20 ENCOUNTER — Ambulatory Visit (INDEPENDENT_AMBULATORY_CARE_PROVIDER_SITE_OTHER): Payer: 59

## 2023-09-20 ENCOUNTER — Encounter: Payer: Self-pay | Admitting: Podiatry

## 2023-09-20 ENCOUNTER — Ambulatory Visit (INDEPENDENT_AMBULATORY_CARE_PROVIDER_SITE_OTHER): Payer: 59 | Admitting: Podiatry

## 2023-09-20 DIAGNOSIS — M722 Plantar fascial fibromatosis: Secondary | ICD-10-CM

## 2023-09-20 DIAGNOSIS — M778 Other enthesopathies, not elsewhere classified: Secondary | ICD-10-CM

## 2023-09-20 MED ORDER — METHYLPREDNISOLONE 4 MG PO TBPK: ORAL_TABLET | ORAL | 0 refills | Status: DC

## 2023-09-20 MED ORDER — TRIAMCINOLONE ACETONIDE 40 MG/ML IJ SUSP
20.0000 mg | Freq: Once | INTRAMUSCULAR | Status: AC
  Administered 2023-09-20: 20 mg

## 2023-09-20 NOTE — Progress Notes (Signed)
Subjective:  Patient ID: Martha Page, female    DOB: [birth year],  MRN: [id number] HPI Chief Complaint  Patient presents with   Foot Pain    Plantar/lateral heel right -trip to [place] in June and did lots of walking, hurt knee and feet and ankles started to swell, sharp pain in the heel and lateral side, sometimes burns, PCP eval-doing exercises, roller massage, Rx'd meloxicam, but prefers Ibuprofen, but also not helping now, tried new shoes   New Patient (Initial Visit)    42 y.o. female presents with the above complaint.   ROS: Denies fever chills nausea vomit muscle aches pains calf pain back pain chest pain shortness of breath.  Past Medical History:  Diagnosis Date   Acne    iPledge: [phone]   Anxiety    Dermatitis    eczema   Elevated liver enzymes    Frequent headaches    GERD (gastroesophageal reflux disease)    Hx of dysplastic nevus 04/02/2013   Left distal knee. Mild atypia, margins free   Migraine    2x/6 mos   Pneumonia 06/30/2016   has finished antibiotics.  still has lingering nighttime cough   Shoulder pain    Sleep apnea    uses CPAP   Past Surgical History:  Procedure Laterality Date   CESAREAN SECTION     CESAREAN SECTION     CHOLECYSTECTOMY  2003   COLONOSCOPY WITH PROPOFOL N/A 04/17/2015   Procedure: COLONOSCOPY WITH PROPOFOL;  Surgeon: Elnita Maxwell, MD;  Location: [facility] ENDOSCOPY;  Service: Endoscopy;  Laterality: N/A;   COLONOSCOPY WITH PROPOFOL N/A 01/10/2022   Procedure: COLONOSCOPY WITH PROPOFOL;  Surgeon: Midge Minium, MD;  Location: [facility];  Service: Endoscopy;  Laterality: N/A;   DIAGNOSTIC LAPAROSCOPY WITH REMOVAL OF ECTOPIC PREGNANCY     DILATION AND CURETTAGE OF UTERUS     DUPUYTREN CONTRACTURE RELEASE     ECTOPIC PREGNANCY SURGERY     ESOPHAGOGASTRODUODENOSCOPY (EGD) WITH PROPOFOL N/A 07/22/2016   Procedure: ESOPHAGOGASTRODUODENOSCOPY (EGD) WITH PROPOFOL;  Surgeon: Midge Minium, MD;  Location: [facility];   Service: Endoscopy;  Laterality: N/A;   FOREIGN BODY REMOVAL N/A 01/10/2022   Procedure: FOREIGN BODY REMOVAL;  Surgeon: Midge Minium, MD;  Location: [facility];  Service: Endoscopy;  Laterality: N/A;  removal of rectal staple   GALLBLADDER SURGERY  2002   HEMORRHOID SURGERY     HEMORRHOID SURGERY N/A 06/05/2015   Procedure: HEMORRHOIDECTOMY;  Surgeon: Nadeen Landau, MD;  Location: [facility] ORS;  Service: General;  Laterality: N/A;   INTRAUTERINE DEVICE (IUD) INSERTION     LASIK     TUBAL LIGATION      Current Outpatient Medications:    meloxicam (MOBIC) 15 MG tablet, Take 15 mg by mouth daily., Disp: , Rfl:    traZODone (DESYREL) 50 MG tablet, Take 100 mg by mouth at bedtime as needed., Disp: , Rfl:    buPROPion ER (WELLBUTRIN SR) 100 MG 12 hr tablet, Take 100 mg by mouth 2 (two) times daily., Disp: , Rfl:    cetirizine (ZYRTEC) 10 MG tablet, TAKE 1 TABLET BY MOUTH EVERY DAY, Disp: 90 tablet, Rfl: 0   cyanocobalamin (VITAMIN B12) 1000 MCG/ML injection, INJECT INTO THE MUSCLE EVERY 30 DAYS, Disp: 1 mL, Rfl: 4   doxycycline (MONODOX) 100 MG capsule, Take 1 capsule (100 mg total) by mouth 2 (two) times daily. Take with food and drink, Disp: 60 capsule, Rfl: 11   ibuprofen (ADVIL) 800 MG tablet, TAKE  1 TABLET BY MOUTH EVERY 8 HOURS AS NEEDED, Disp: 90 tablet, Rfl: 0   ketoconazole (NIZORAL) 2 % shampoo, Apply 1 Application topically 3 (three) times a week. Wash scalp, chest, back 3 times weekly, let sit 5-10 minutes and rinse out, Disp: 120 mL, Rfl: 11   LORazepam (ATIVAN) 0.5 MG tablet, Take 0.25 mg by mouth 2 (two) times daily as needed., Disp: , Rfl:    MIRENA, 52 MG, 20 MCG/24HR IUD, , Disp: , Rfl:    pantoprazole (PROTONIX) 40 MG tablet, TAKE 1 TABLET BY MOUTH TWICE A DAY, Disp: 180 tablet, Rfl: 1   SUMAtriptan (IMITREX) 100 MG tablet, Take 1 tablet (100 mg total) by mouth every 2 (two) hours as needed for migraine. May repeat in 2 hours if headache persists or recurs., Disp: 10  tablet, Rfl: 0   venlafaxine (EFFEXOR) 75 MG tablet, Take 1 tablet every day by oral route., Disp: , Rfl:    Vibegron (GEMTESA) 75 MG TABS, Take 1 tablet (75 mg total) by mouth daily., Disp: 42 tablet, Rfl: 0  Allergies  Allergen Reactions   Cefazolin Rash and Shortness Of Breath   Codeine Sulfate Itching   Red Dye #40 (Allura Red) Hives   Sulfa Antibiotics Other (See Comments)    "Stomach pain"   Amoxicillin Rash   Keflex [Cephalexin] Swelling and Rash   Penicillin V Potassium Rash   Review of Systems Objective:  There were no vitals filed for this visit.  General: Well developed, nourished, in no acute distress, alert and oriented x3   Dermatological: Skin is warm, dry and supple bilateral. Nails x 10 are well maintained; remaining integument appears unremarkable at this time. There are no open sores, no preulcerative lesions, no rash or signs of infection present.  Vascular: Dorsalis Pedis artery and Posterior Tibial artery pedal pulses are 2/4 bilateral with immedate capillary fill time. Pedal hair growth present. No varicosities and no lower extremity edema present bilateral.   Neruologic: Grossly intact via light touch bilateral. Vibratory intact via tuning fork bilateral. Protective threshold with Semmes Wienstein monofilament intact to all pedal sites bilateral. Patellar and Achilles deep tendon reflexes 2+ bilateral. No Babinski or clonus noted bilateral.   Musculoskeletal: No gross boney pedal deformities bilateral. No pain, crepitus, or limitation noted with foot and ankle range of motion bilateral. Muscular strength 5/5 in all groups tested bilateral.  Moderate severe pain on palpation medial calcaneal tubercle of the right heel tenderness on palpation and fluctuance on palpation of the posterior tibial tendon Achilles tendon is moderately tender as well as is the lateral aspect of her right heel.  Gait: Unassisted, Nonantalgic.    Radiographs:  Radiographs taken today  demonstrate osseously mature individual with good bone mineralization.  Right foot demonstrate soft tissue increase in density of plantar fascial calcaneal insertion site but no ruptures visible and no significant osteoarthritic changes.  Mild pes planus is noted.   Assessment & Plan:   Assessment: Planter fasciitis right foot.  Plan: Discussed etiology pathology conservative surgical therapies at this point started her on methylprednisolone to be followed by her 800 mg ibuprofen 3 times daily with food.  Injected the right heel today 20 mg Kenalog 5 mg Marcaine point of maximal tenderness.  Placing her in a plantar fascial brace and a night splint.  Discussed appropriate shoe gear stretching exercises ice therapy and shoe gear modifications.  I would like to follow-up with her in 1 month.     Felishia Wartman T. [place], [place]

## 2023-09-20 NOTE — Patient Instructions (Signed)

## 2023-10-02 ENCOUNTER — Encounter (HOSPITAL_BASED_OUTPATIENT_CLINIC_OR_DEPARTMENT_OTHER): Payer: Self-pay | Admitting: Urology

## 2023-10-02 NOTE — Progress Notes (Signed)
Spoke w/ via phone for pre-op interview--- Martha Page needs dos----   UPt per anesthesia.      Lab results------ COVID test -----patient states asymptomatic no test needed Arrive at -------0700 NPO after MN NO Solid Food.  Clear liquids from MN until---0600 Med rec completed Medications to take morning of surgery -----Wellbutrin, Protonix and Effexor. Imitrex and Ativan PRN.  Diabetic medication ----- Patient instructed no nail polish to be worn day of surgery Patient instructed to bring photo id and insurance card day of surgery Patient aware to have Driver (ride ) / caregiver    for 24 hours after surgery - Husband [name] Patient Special Instructions ----- Pre-Op special Instructions ----- Patient verbalized understanding of instructions that were given at this phone interview. Patient denies chest pain, sob, fever, cough at the interview.

## 2023-10-09 NOTE — H&P (Signed)
CC/HPI: cc: Stress urinary incontinence   09/08/2023: 42 year old woman with several year history of stress urinary incontinence that has been worsening recently. She has a history of 2 vaginal deliveries followed by C-section for triplets. She is done having children. She has an IUD and has had a bilateral salpingectomy. She does have some mild urinary urgency has tried oxybutynin previously. She was recently given samples for Gemtesa but just darted taking it a few days ago. She had a cystoscopy by Dr. 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 Urodynamic study showed stress urinary incontinence. No instability was seen. Patient has met her deductible and wishes to proceed straight to a sling.   Overactive bladder body question screener: 4, 2, 1, 5, 4, 3, 5, 4=28/40     ALLERGIES: Amoxicillin - Skin Rash Cefazolin - Trouble Breathing, Skin Rash Codeine Sulfate - Itching Keflex - Skin Rash, Swelling Penicillin V Potassium TABS - Skin Rash Red Dye # 40 - Hives Sulfa - Other Reaction, Stomach pain.    MEDICATIONS: Doxycycline Hyclate 100 mg capsule  Bupropion Hcl 100 mg tablet  Cetirizine Hcl 10 mg tablet  Cyanocobalamin Injection 1,000 mcg/ml vial  Ibuprofen 800 mg tablet  Ketoconazole 2 % shampoo  Lorazepam 0.5 mg tablet  Mirena 21 mcg/24 hour (8 years) 52 mg intrauterine device  Sumatriptan Succinate 100 mg tablet  Venlafaxine Hcl 75 mg tablet     GU PSH: Complex cystometrogram, w/ void pressure and urethral pressure profile studies, any technique - 08/24/2023 Complex Uroflow - 08/24/2023 Emg surf Electrd - 08/24/2023 Inject For cystogram - 08/24/2023 Intrabd voidng Press - 08/24/2023 IUD Insertion       PSH Notes: Colonoscopy with propofol 04/17/2015  Colonoscopy with propofol 01/10/2022  Diagnostic laparoscopy with removal of ectopic pregnancy  Dilation and curettage of uterus  Dupuytren contracture release.   Esophagogastroduodenoscopy foreign body removal 01/10/2022   NON-GU PSH: Bilateral Tubal Ligation Cesarean Delivery Only Cholecystectomy (laparoscopic) Colonoscopy Hemorrhoidectomy Remove Gallbladder     GU PMH: Mixed incontinence - 08/24/2023    NON-GU PMH: Anxiety Arthritis Depression GERD Sleep Apnea    FAMILY HISTORY: Anxiety - Mother Aortic Aneurysm - Brother Arthritis - Grandmother Depression - Mother Diabetes - Mother fibromyalgia - Mother Heart Disease - Father, Grandmother Hypertension - Grandmother, Father Interstitial cystitis - Mother Lung Cancer - Grandmother stroke - Grandmother, Mother throat cancer - Father   SOCIAL HISTORY: Marital Status: Married Current Smoking Status: Patient does not smoke anymore.   Tobacco Use Assessment Completed: Used Tobacco in last 30 days? Does not drink anymore.  Drinks 1 caffeinated drink per day.    REVIEW OF SYSTEMS:    GU Review Female:   Patient reports frequent urination, hard to postpone urination, get up at night to urinate, and leakage of urine. Patient denies burning /pain with urination, stream starts and stops, trouble starting your stream, have to strain to urinate, and being pregnant.  Gastrointestinal (Upper):   Patient denies nausea, vomiting, and indigestion/ heartburn.  Gastrointestinal (Lower):   Patient reports constipation. Patient denies diarrhea.  Constitutional:   Patient reports night sweats. Patient denies fever, weight loss, and fatigue.  Skin:   Patient denies skin rash/ lesion and itching.  Eyes:   Patient denies blurred vision and double vision.  Ears/ Nose/ Throat:   Patient denies sore throat and sinus problems.  Hematologic/Lymphatic:   Patient denies swollen glands and easy bruising.  Cardiovascular:   Patient denies leg swelling and chest  pains.  Respiratory:   Patient reports cough. Patient denies shortness of breath.  Endocrine:   Patient reports excessive thirst.    Musculoskeletal:   Patient reports joint pain. Patient denies back pain.  Neurological:   Patient reports headaches. Patient denies dizziness.  Psychologic:   Patient reports depression and anxiety.    VITAL SIGNS:      09/08/2023 12:18 PM  Weight 222 lb / 100.7 kg  Height 65 in / 165.1 cm  BP 107/70 mmHg  Pulse 81 /min  Temperature 98.0 F / 36.6 C  BMI 36.9 kg/m   MULTI-SYSTEM PHYSICAL EXAMINATION:    Constitutional: Well-nourished. No physical deformities. Normally developed. Good grooming.  Neck: Neck symmetrical, not swollen. Normal tracheal position.  Respiratory: No labored breathing, no use of accessory muscles.   Skin: No paleness, no jaundice, no cyanosis. No lesion, no ulcer, no rash.  Neurologic / Psychiatric: Oriented to time, oriented to place, oriented to person. No depression, no anxiety, no agitation.  Eyes: Normal conjunctivae. Normal eyelids.  Ears, Nose, Mouth, and Throat: Left ear no scars, no lesions, no masses. Right ear no scars, no lesions, no masses. Nose no scars, no lesions, no masses. Normal hearing. Normal lips.  Musculoskeletal: Normal gait and station of head and neck.     Complexity of Data:  Records Review:   Previous Patient Records, POC Tool  Urine Test Review:   Urinalysis  Urodynamics Review:   Review Urodynamics Tests   PROCEDURES:         PVR Ultrasound - 16109  Scanned Volume: 140 cc         Urinalysis - 81003 Dipstick Dipstick Cont'd  Color: Yellow Bilirubin: Neg  Appearance: Clear Ketones: Neg  Specific Gravity: 1.010 Blood: Neg  pH: 6.0 Protein: Neg  Glucose: Neg Urobilinogen: 0.2    Nitrites: Neg    Leukocyte Esterase: Neg    Notes:      ASSESSMENT:      ICD-10 Details  1 GU:   Urinary Urgency - R39.15 Chronic, Stable  2   Stress Incontinence - N39.3 Chronic, Worsening   PLAN:           Document Letter(s):  Created for Patient: Clinical Summary         Notes:   Stress urinary continence:  -Reviewed patient's  urodynamics today which confirmed stress urinary incontinence  -I discussed management options including pelvic floor physical therapy, Bulkamid and mid urethral sling. Patient wishes to proceed with mid urethral sling as she has met her deductible and wants to be done with this.  -I reviewed Dr. Clint Lipps notes from epic  -Risks and benefits of mid urethral sling were discussed with the patient in detail including but not limited to pain, bleeding, infection, damage to surrounding structures, mesh erosion/extrusion, urinary retention, need for additional procedures, worsening urinary urgency/frequency. She understand that she may need to go home with a Foley catheter.   Schedule next available date

## 2023-10-11 ENCOUNTER — Encounter: Payer: 59 | Admitting: Internal Medicine

## 2023-10-12 ENCOUNTER — Ambulatory Visit (HOSPITAL_BASED_OUTPATIENT_CLINIC_OR_DEPARTMENT_OTHER): Payer: 59 | Admitting: Certified Registered Nurse Anesthetist

## 2023-10-12 ENCOUNTER — Ambulatory Visit (HOSPITAL_BASED_OUTPATIENT_CLINIC_OR_DEPARTMENT_OTHER)
Admission: RE | Admit: 2023-10-12 | Discharge: 2023-10-12 | Disposition: A | Discharge status: Home / Self Care | Payer: 59 | Attending: Urology | Admitting: Urology

## 2023-10-12 ENCOUNTER — Other Ambulatory Visit: Payer: Self-pay

## 2023-10-12 ENCOUNTER — Other Ambulatory Visit: Payer: Self-pay | Admitting: Internal Medicine

## 2023-10-12 ENCOUNTER — Encounter (HOSPITAL_BASED_OUTPATIENT_CLINIC_OR_DEPARTMENT_OTHER)
Admission: RE | Disposition: A | Discharge status: Home / Self Care | Payer: Self-pay | Source: Home / Self Care | Attending: Urology

## 2023-10-12 ENCOUNTER — Encounter (HOSPITAL_BASED_OUTPATIENT_CLINIC_OR_DEPARTMENT_OTHER): Payer: Self-pay | Admitting: Urology

## 2023-10-12 DIAGNOSIS — N393 Stress incontinence (female) (male): Secondary | ICD-10-CM | POA: Insufficient documentation

## 2023-10-12 DIAGNOSIS — R3915 Urgency of urination: Secondary | ICD-10-CM | POA: Diagnosis not present

## 2023-10-12 DIAGNOSIS — Z9079 Acquired absence of other genital organ(s): Secondary | ICD-10-CM | POA: Diagnosis not present

## 2023-10-12 DIAGNOSIS — G4730 Sleep apnea, unspecified: Secondary | ICD-10-CM | POA: Diagnosis not present

## 2023-10-12 DIAGNOSIS — Z87891 Personal history of nicotine dependence: Secondary | ICD-10-CM | POA: Diagnosis not present

## 2023-10-12 DIAGNOSIS — Z01818 Encounter for other preprocedural examination: Secondary | ICD-10-CM

## 2023-10-12 DIAGNOSIS — K219 Gastro-esophageal reflux disease without esophagitis: Secondary | ICD-10-CM | POA: Diagnosis not present

## 2023-10-12 HISTORY — PX: CYSTOSCOPY: SHX5120

## 2023-10-12 HISTORY — PX: PUBOVAGINAL SLING: SHX1035

## 2023-10-12 LAB — POCT PREGNANCY, URINE: Preg Test, Ur: NEGATIVE

## 2023-10-12 SURGERY — CYSTOSCOPY
Anesthesia: General | Site: Vagina

## 2023-10-12 MED ORDER — DEXAMETHASONE SODIUM PHOSPHATE 10 MG/ML IJ SOLN
INTRAMUSCULAR | Status: DC | PRN
  Administered 2023-10-12: 10 mg via INTRAVENOUS

## 2023-10-12 MED ORDER — FENTANYL CITRATE (PF) 250 MCG/5ML IJ SOLN
INTRAMUSCULAR | Status: DC | PRN
  Administered 2023-10-12 (×2): 25 ug via INTRAVENOUS
  Administered 2023-10-12: 50 ug via INTRAVENOUS

## 2023-10-12 MED ORDER — STERILE WATER FOR IRRIGATION IR SOLN
Status: DC | PRN
  Administered 2023-10-12: 3000 mL

## 2023-10-12 MED ORDER — OXYCODONE HCL 5 MG PO TABS
5.0000 mg | ORAL_TABLET | Freq: Once | ORAL | Status: AC | PRN
  Administered 2023-10-12: 5 mg via ORAL

## 2023-10-12 MED ORDER — FENTANYL CITRATE (PF) 100 MCG/2ML IJ SOLN
INTRAMUSCULAR | Status: AC
  Filled 2023-10-12: qty 2

## 2023-10-12 MED ORDER — OXYCODONE HCL 5 MG PO TABS
ORAL_TABLET | ORAL | Status: AC
  Filled 2023-10-12: qty 1

## 2023-10-12 MED ORDER — HYDROMORPHONE HCL 1 MG/ML IJ SOLN
0.2500 mg | INTRAMUSCULAR | Status: DC | PRN
  Administered 2023-10-12 (×2): 0.5 mg via INTRAVENOUS

## 2023-10-12 MED ORDER — CIPROFLOXACIN HCL 500 MG PO TABS: 500.0000 mg | ORAL_TABLET | Freq: Every day | ORAL | 0 refills | Status: AC

## 2023-10-12 MED ORDER — HYDROMORPHONE HCL 2 MG PO TABS
1.0000 mg | ORAL_TABLET | Freq: Once | ORAL | Status: AC
  Administered 2023-10-12: 1 mg via ORAL

## 2023-10-12 MED ORDER — ONDANSETRON HCL 4 MG/2ML IJ SOLN
INTRAMUSCULAR | Status: DC | PRN
  Administered 2023-10-12: 4 mg via INTRAVENOUS

## 2023-10-12 MED ORDER — ONDANSETRON HCL 4 MG/2ML IJ SOLN: 4.0000 mg | Freq: Once | INTRAMUSCULAR | Status: DC | PRN

## 2023-10-12 MED ORDER — MIDAZOLAM HCL 2 MG/2ML IJ SOLN
INTRAMUSCULAR | Status: AC
  Filled 2023-10-12: qty 2

## 2023-10-12 MED ORDER — FENTANYL CITRATE (PF) 100 MCG/2ML IJ SOLN
25.0000 ug | INTRAMUSCULAR | Status: DC | PRN
  Administered 2023-10-12: 50 ug via INTRAVENOUS
  Administered 2023-10-12: 25 ug via INTRAVENOUS
  Administered 2023-10-12: 50 ug via INTRAVENOUS
  Administered 2023-10-12: 25 ug via INTRAVENOUS

## 2023-10-12 MED ORDER — LACTATED RINGERS IV SOLN: INTRAVENOUS | Status: DC

## 2023-10-12 MED ORDER — HYDROMORPHONE HCL 1 MG/ML IJ SOLN
INTRAMUSCULAR | Status: AC
  Filled 2023-10-12: qty 1

## 2023-10-12 MED ORDER — PROPOFOL 1000 MG/100ML IV EMUL
INTRAVENOUS | Status: AC
  Filled 2023-10-12: qty 100

## 2023-10-12 MED ORDER — EPHEDRINE SULFATE (PRESSORS) 50 MG/ML IJ SOLN
INTRAMUSCULAR | Status: DC | PRN
  Administered 2023-10-12 (×2): 5 mg via INTRAVENOUS

## 2023-10-12 MED ORDER — PROPOFOL 10 MG/ML IV BOLUS
INTRAVENOUS | Status: DC | PRN
  Administered 2023-10-12: 200 mg via INTRAVENOUS

## 2023-10-12 MED ORDER — PROPOFOL 10 MG/ML IV BOLUS
INTRAVENOUS | Status: AC
  Filled 2023-10-12: qty 20

## 2023-10-12 MED ORDER — CIPROFLOXACIN IN D5W 400 MG/200ML IV SOLN
400.0000 mg | INTRAVENOUS | Status: AC
  Administered 2023-10-12: 400 mg via INTRAVENOUS

## 2023-10-12 MED ORDER — HYDROMORPHONE HCL 2 MG PO TABS
ORAL_TABLET | ORAL | Status: AC
  Filled 2023-10-12: qty 1

## 2023-10-12 MED ORDER — DEXMEDETOMIDINE HCL IN NACL 80 MCG/20ML IV SOLN
INTRAVENOUS | Status: DC | PRN
  Administered 2023-10-12 (×2): 8 ug via INTRAVENOUS

## 2023-10-12 MED ORDER — PHENYLEPHRINE 80 MCG/ML (10ML) SYRINGE FOR IV PUSH (FOR BLOOD PRESSURE SUPPORT)
PREFILLED_SYRINGE | INTRAVENOUS | Status: DC | PRN
  Administered 2023-10-12 (×2): 80 ug via INTRAVENOUS

## 2023-10-12 MED ORDER — OXYCODONE HCL 5 MG PO TABS: 5.0000 mg | ORAL_TABLET | Freq: Three times a day (TID) | ORAL | 0 refills | Status: DC | PRN

## 2023-10-12 MED ORDER — KETOROLAC TROMETHAMINE 30 MG/ML IJ SOLN: 30.0000 mg | Freq: Once | INTRAMUSCULAR | Status: DC | PRN

## 2023-10-12 MED ORDER — CIPROFLOXACIN IN D5W 400 MG/200ML IV SOLN
INTRAVENOUS | Status: AC
  Filled 2023-10-12: qty 200

## 2023-10-12 MED ORDER — LIDOCAINE 2% (20 MG/ML) 5 ML SYRINGE
INTRAMUSCULAR | Status: DC | PRN
  Administered 2023-10-12: 100 mg via INTRAVENOUS

## 2023-10-12 MED ORDER — OXYCODONE HCL 5 MG/5ML PO SOLN: 5.0000 mg | Freq: Once | ORAL | Status: AC | PRN

## 2023-10-12 MED ORDER — MIDAZOLAM HCL 2 MG/2ML IJ SOLN
INTRAMUSCULAR | Status: DC | PRN
  Administered 2023-10-12: 2 mg via INTRAVENOUS

## 2023-10-12 MED ORDER — LIDOCAINE-EPINEPHRINE (PF) 1 %-1:200000 IJ SOLN
INTRAMUSCULAR | Status: DC | PRN
  Administered 2023-10-12: 20 mL

## 2023-10-12 MED ORDER — IBUPROFEN 800 MG PO TABS: 800.0000 mg | ORAL_TABLET | Freq: Three times a day (TID) | ORAL | 0 refills | Status: DC | PRN

## 2023-10-12 MED ORDER — ESTRADIOL 0.1 MG/GM VA CREA
TOPICAL_CREAM | VAGINAL | Status: DC | PRN
  Administered 2023-10-12: 1 via VAGINAL

## 2023-10-12 SURGICAL SUPPLY — 31 items
BAG URINE DRAIN 2000ML AR STRL (UROLOGICAL SUPPLIES) ×2 IMPLANT
BLADE CLIPPER SENSICLIP SURGIC (BLADE) IMPLANT
BLADE SURG 15 STRL LF DISP TIS (BLADE) ×2 IMPLANT
CATH FOLEY 2WAY SLVR 5CC 14FR (CATHETERS) ×2 IMPLANT
DERMABOND ADVANCED .7 DNX12 (GAUZE/BANDAGES/DRESSINGS) ×2 IMPLANT
ELECT REM PT RETURN 9FT ADLT (ELECTROSURGICAL) IMPLANT
ELECTRODE REM PT RTRN 9FT ADLT (ELECTROSURGICAL) IMPLANT
GAUZE 4X4 16PLY ~~LOC~~+RFID DBL (SPONGE) IMPLANT
GAUZE PACKING 1INX5YD STRL (GAUZE/BANDAGES/DRESSINGS) IMPLANT
GLOVE BIO SURGEON STRL SZ 6.5 (GLOVE) ×2 IMPLANT
GLOVE BIOGEL PI IND STRL 6.5 (GLOVE) IMPLANT
GOWN STRL REUS W/TWL LRG LVL3 (GOWN DISPOSABLE) ×2 IMPLANT
KIT TURNOVER CYSTO (KITS) ×2 IMPLANT
MANIFOLD NEPTUNE II (INSTRUMENTS) IMPLANT
NDL HYPO 25X1 1.5 SAFETY (NEEDLE) ×2 IMPLANT
NEEDLE HYPO 25X1 1.5 SAFETY (NEEDLE) ×2 IMPLANT
NS IRRIG 500ML POUR BTL (IV SOLUTION) IMPLANT
PACK VAGINAL WOMENS (CUSTOM PROCEDURE TRAY) ×2 IMPLANT
PACKING VAGINAL (PACKING) IMPLANT
PLUG CATH AND CAP STRL 200 (CATHETERS) ×2 IMPLANT
RETRACTOR LONE STAR DISPOSABLE (INSTRUMENTS) IMPLANT
RETRACTOR STAY HOOK 5MM (MISCELLANEOUS) ×2 IMPLANT
SET IRRIG Y TYPE TUR BLADDER L (SET/KITS/TRAYS/PACK) ×2 IMPLANT
SLEEVE SCD COMPRESS KNEE MED (STOCKING) ×2 IMPLANT
SLING LYNX SUPRAPUBIC (Sling) IMPLANT
SUT VIC AB 2-0 UR5 27 (SUTURE) ×2 IMPLANT
SUT VIC AB 3-0 SH 27X BRD (SUTURE) IMPLANT
SYR 10ML LL (SYRINGE) ×2 IMPLANT
SYR BULB IRRIG 60ML STRL (SYRINGE) ×2 IMPLANT
WATER STERILE IRR 3000ML UROMA (IV SOLUTION) IMPLANT
WATER STERILE IRR 500ML POUR (IV SOLUTION) IMPLANT

## 2023-10-12 NOTE — Transfer of Care (Signed)
Immediate Anesthesia Transfer of Care Note  Patient: Martha Page  Procedure(s) Performed: CYSTOSCOPY (Bladder) MID-URETHRAL SLING (LYNX) (Vagina )  Patient Location: PACU  Anesthesia Type:General  Level of Consciousness: drowsy and patient cooperative  Airway & Oxygen Therapy: Patient Spontanous Breathing  Post-op Assessment: Report given to RN and Post -op Vital signs reviewed and stable  Post vital signs: Reviewed and stable  Last Vitals:  Vitals Value Taken Time  BP 125/62 10/12/23 0952  Temp    Pulse 103 10/12/23 0953  Resp 14 10/12/23 0953  SpO2 93 % 10/12/23 0953  Vitals shown include unfiled device data.  Last Pain:  Vitals:   10/12/23 0752  TempSrc: Oral         Complications: No notable events documented.

## 2023-10-12 NOTE — Op Note (Signed)
Operative Note  Preoperative diagnosis:  1.  Stress urinary incontinence  Postoperative diagnosis: 1.  Stress urinary incontinence  Procedure(s): 1.  Mid-urethral sling [facility] retropubic) 2.  Cystoscopy  Surgeon: Kasandra Knudsen, MD  Assistants:  None  Anesthesia:  General  Complications:  None  EBL:   Specimens: 1. none  Drains/Catheters: 1.  14Fr foley  Intraoperative findings:   Normal urethra Bilateral orthotopic Uos effluxing clear urine after trocar placement Bladder mucosa normal after trocar placement. No evidence of bladder perforation.  Indication:  Martha Page is a 42 y.o. female with symptomatic stress urinary incontinence.  Description of procedure: After risks and benefits of the procedure discussed with the patient, informed consent was obtained.  The patient was taken to the operating room placed in the supine position.  Anesthesia was induced and antibiotics were administered.  She was then repositioned in the dorsolithotomy position.  The patient was prepped and draped in usual sterile fashion and timeout was performed.   A 14 French Foley catheter was then placed in the patient's urethra and the bladder was drained. The Foley catheter was then capped. Using the skin hooks for skin nodes were placed in the corners of the labia minora to open up the vaginal vestibule. 5 cc of quarter percent liodcaine with 1% epinephrine was then injected into the periurethral tissue. The suprapubic incisions were then marked out 1 cm lateral to midline and 1 cm above the pubic bone. Using a 15 blade a stab incision was made in both sides of midline. Gauze is placed over these areas to control the skin bleeding. A 1.5 cm incision was then made in the mid urethra through the vaginal mucosa. Using scissors dissection was then carried out. Her urethra we laterally on both sides to the endopelvic fascia. The dissection was completed once there was enough space to place a  finger through the incision on both sides of the urethra. Using the [facility] needle set both needles were passed through the stab incisions suprapubically down posterior to the pubic bone and then through the periurethral incisions using the index finger to guide the needle out of the incision. Once both needles had been placed the Foley catheter was removed and cystoscopy was performed. A 70 lens was passed gently through the patient's urethra and into the bladder under visual guidance. A 360 cystoscopic evaluation was performed. There was no mucosal abnormalities or evidence of perforation from the needles. The cystoscope was then removed and the Foley catheter replaced. The bladder was then drained again and the Foley catheter. The ends of the sling were then attached to the needles and the needles pulled up through the retropubic space and out the suprapubic incision. Once the sling was noted to be centered, the blue plastic cap at the apex of the sling was cut and the plastic sheath was then pulled out. The mesh was noted to be well seated around the urethra. A right angle was used to ensure that the proper amount of tension for the sling around the urethra applied. The sling was noted to be tension free and well positioned. At this point copious amounts of double antibiotic irrigation was then used to irrigate the incision the periurethral space and the vagina. The incision was then closed with 2-0 Vicryl in a running fashion. The stab incisions in the suprapubic area were closed with Dermabond. The vagina was then packed with clindamycin impregnated vaginal packing. The patient was subsequently extubated and returned to  the PACU in stable condition.  Disposition: Vaginal packing and foley will be removed in the PACU.  She will be given 3 days of antibiotics as well as pain medications. Followup has been scheduled for 2 weeks.

## 2023-10-12 NOTE — Anesthesia Procedure Notes (Signed)
Procedure Name: LMA Insertion Date/Time: 10/12/2023 8:26 AM  Performed by: Dairl Ponder, CRNAPre-anesthesia Checklist: Patient identified, Emergency Drugs available, Suction available and Patient being monitored Patient Re-evaluated:Patient Re-evaluated prior to induction Oxygen Delivery Method: Circle System Utilized Preoxygenation: Pre-oxygenation with 100% oxygen Induction Type: IV induction Ventilation: Mask ventilation without difficulty LMA: LMA inserted LMA Size: 4.0 Number of attempts: 1 Airway Equipment and Method: Bite block Placement Confirmation: positive ETCO2 Tube secured with: Tape Dental Injury: Teeth and Oropharynx as per pre-operative assessment

## 2023-10-12 NOTE — Anesthesia Preprocedure Evaluation (Signed)
Anesthesia Evaluation  Patient identified by MRN, date of birth, ID band Patient awake    Reviewed: Allergy & Precautions, H&P , NPO status , Patient's Chart, lab work & pertinent test results  Airway Mallampati: II  TM Distance: >3 FB Neck ROM: Full    Dental no notable dental hx.    Pulmonary sleep apnea and Continuous Positive Airway Pressure Ventilation , former smoker   Pulmonary exam normal breath sounds clear to auscultation       Cardiovascular negative cardio ROS Normal cardiovascular exam Rhythm:Regular Rate:Normal     Neuro/Psych negative neurological ROS  negative psych ROS   GI/Hepatic Neg liver ROS,GERD  Medicated,,  Endo/Other  negative endocrine ROS    Renal/GU negative Renal ROS  negative genitourinary   Musculoskeletal negative musculoskeletal ROS (+)    Abdominal   Peds negative pediatric ROS (+)  Hematology negative hematology ROS (+)   Anesthesia Other Findings   Reproductive/Obstetrics negative OB ROS                             Anesthesia Physical Anesthesia Plan  ASA: 2  Anesthesia Plan: General   Post-op Pain Management: Minimal or no pain anticipated   Induction: Intravenous  PONV Risk Score and Plan: 3 and Ondansetron, Dexamethasone, Droperidol and Treatment may vary due to age or medical condition  Airway Management Planned: LMA  Additional Equipment:   Intra-op Plan:   Post-operative Plan: Extubation in OR  Informed Consent: I have reviewed the patients History and Physical, chart, labs and discussed the procedure including the risks, benefits and alternatives for the proposed anesthesia with the patient or authorized representative who has indicated his/her understanding and acceptance.     Dental advisory given  Plan Discussed with: CRNA and Surgeon  Anesthesia Plan Comments:        Anesthesia Quick Evaluation

## 2023-10-12 NOTE — Discharge Instructions (Addendum)
Post-op Instructions following Mid-urethral Sling surgery   Removal of catheter You will go home with a catheter or tube in your bladder. You will remove this the day after surgery by cutting the tube that sticks off the end of the catheter (the balloon port with numbers written on it). You can do this in the shower and allow the catheter to fall out.  Ask [place] if you have any questions about the catheter management.  Remove the foley catheter after 24 hours ( day after the procedure).can be done easily by cutting the side port of the catheter, whichallow the balloon to deflate.  You will see 1-2 teaspoons of clear water as the balloon deflates and then the catheter can be slid out without difficulty.        Cut here   Diet:  You may return to your normal diet within 24 hours following your surgery. You may note some mild nausea and possibly vomiting the first 6-8 hours following surgery. This is usually due to the side effects of anesthesia, and will disappear quite soon. I would suggest clear liquids and a very light meal the first evening following your surgery.  Activity:  Your physical activity is to be restricted, especially during the first 2 weeks home. During this time use the following guidelines:  No lifting heavy objects (anything greater than 10 pounds). No driving a car and limit long car rides. No strenuous exercise, limits stairclimbing to a minimum. Do not swim or soak in a bath tub for 2 weeks. Do not place anything per vagina for 4 weeks.  Wound care: There is very little wound care.  The suprapubic incisions (above your pubic bone) are glued closed and this will peel off over the next 7-14 days.  They do not need to be covered.   The vaginal incision may ooze/bleed a little bit the first couple days after surgery.  This is normal.  It is okay to use a sanitary pad to help keep things clean.  Otherwise this incision needs no additional care.  Bowels:  You may need a  stool softener and. A bowel movement every other day is reasonable. Use a mild laxative if needed, such as milk of magnesia 2-3 tablespoons, or 2 Dulcolax tablets. Call if you continue to have problems. If you had been taking narcotics for pain, before, during or after your surgery, you may be constipated. Take a laxative if necessary.  Medication:  You should resume your pre-surgery medications unless told not to. In addition you may be given an antibiotic to prevent or treat infection. Antibiotics are not always necessary. Pain pills (Tramadol) may also be given to help with the incision and catheter discomfort. Tylenol (acetaminophen) or Advil (ibuprofen) which have no narcotics are better if the pain is not too bad. All medication should be taken as prescribed until the bottles are finished unless you are having an unusual reaction to one of the drugs.  Problems you should report to [place]:  a. Fever greater than 101F. b. Heavy bleeding, or clots (see notes above about blood in urine). c. Inability to urinate. d. Drug reactions (hives, rash, nausea, vomiting, diarrhea). e. Severe burning or pain with urination that is not improving.    Post Anesthesia Home Care Instructions  Activity: Get plenty of rest for the remainder of the day. A responsible individual must stay with you for 24 hours following the procedure.  For the next 24 hours, DO NOT: -Drive a car -Operate  machinery -Drink alcoholic beverages -Take any medication unless instructed by your physician -Make any legal decisions or sign important papers.  Meals: Start with liquid foods such as gelatin or soup. Progress to regular foods as tolerated. Avoid greasy, spicy, heavy foods. If nausea and/or vomiting occur, drink only clear liquids until the nausea and/or vomiting subsides. Call your physician if vomiting continues.  Special Instructions/Symptoms: Your throat may feel dry or sore from the anesthesia or the breathing tube  placed in your throat during surgery. If this causes discomfort, gargle with warm salt water. The discomfort should disappear within 24 hours.

## 2023-10-12 NOTE — Anesthesia Postprocedure Evaluation (Signed)
Anesthesia Post Note  Patient: Martha Page  Procedure(s) Performed: CYSTOSCOPY (Bladder) MID-URETHRAL SLING (LYNX) (Vagina )     Patient location during evaluation: PACU Anesthesia Type: General Level of consciousness: awake and alert Pain management: pain level controlled Vital Signs Assessment: post-procedure vital signs reviewed and stable Respiratory status: spontaneous breathing, nonlabored ventilation, respiratory function stable and patient connected to nasal cannula oxygen Cardiovascular status: blood pressure returned to baseline and stable Postop Assessment: no apparent nausea or vomiting Anesthetic complications: no  No notable events documented.  Last Vitals:  Vitals:   10/12/23 1045 10/12/23 1100  BP: 110/61 117/68  Pulse: (!) 101 (!) 111  Resp: 11 13  Temp:    SpO2: 95% 98%    Last Pain:  Vitals:   10/12/23 1045  TempSrc:   PainSc: 8                  Fletcher Ostermiller S

## 2023-10-12 NOTE — Interval H&P Note (Signed)
History and Physical Interval Note:  10/12/2023 7:51 AM  Martha Page  has presented today for surgery, with the diagnosis of STRESS URINARY INCONTINENCE.  The various methods of treatment have been discussed with the patient and family. After consideration of risks, benefits and other options for treatment, the patient has consented to  Procedure(s): CYSTOSCOPY (N/A) MID-URETHRAL SLING (LYNX) (N/A) as a surgical intervention.  The patient's history has been reviewed, patient examined, no change in status, stable for surgery.  I have reviewed the patient's chart and labs.  Questions were answered to the patient's satisfaction.     Martha Page D Rion Schnitzer

## 2023-10-16 ENCOUNTER — Encounter (HOSPITAL_BASED_OUTPATIENT_CLINIC_OR_DEPARTMENT_OTHER): Payer: Self-pay | Admitting: Urology

## 2023-10-23 ENCOUNTER — Ambulatory Visit: Payer: 59 | Admitting: Podiatry

## 2023-10-25 ENCOUNTER — Encounter (INDEPENDENT_AMBULATORY_CARE_PROVIDER_SITE_OTHER): Payer: Self-pay | Admitting: Internal Medicine

## 2023-10-25 ENCOUNTER — Ambulatory Visit (INDEPENDENT_AMBULATORY_CARE_PROVIDER_SITE_OTHER): Payer: 59 | Admitting: Internal Medicine

## 2023-10-25 VITALS — BP 97/67 | HR 82 | Temp 98.2°F | Ht 66.0 in | Wt 217.0 lb

## 2023-10-25 DIAGNOSIS — R0602 Shortness of breath: Secondary | ICD-10-CM

## 2023-10-25 DIAGNOSIS — E559 Vitamin D deficiency, unspecified: Secondary | ICD-10-CM | POA: Diagnosis not present

## 2023-10-25 DIAGNOSIS — Z1331 Encounter for screening for depression: Secondary | ICD-10-CM | POA: Diagnosis not present

## 2023-10-25 DIAGNOSIS — E66812 Obesity, class 2: Secondary | ICD-10-CM

## 2023-10-25 DIAGNOSIS — G4733 Obstructive sleep apnea (adult) (pediatric): Secondary | ICD-10-CM

## 2023-10-25 DIAGNOSIS — R5383 Other fatigue: Secondary | ICD-10-CM | POA: Diagnosis not present

## 2023-10-25 DIAGNOSIS — R7303 Prediabetes: Secondary | ICD-10-CM | POA: Diagnosis not present

## 2023-10-25 DIAGNOSIS — E538 Deficiency of other specified B group vitamins: Secondary | ICD-10-CM

## 2023-10-25 DIAGNOSIS — Z6836 Body mass index (BMI) 36.0-36.9, adult: Secondary | ICD-10-CM

## 2023-10-25 NOTE — Assessment & Plan Note (Signed)
We will check serum methylmalonic acid to check status of B12 supplementation.  She does parenteral B12.

## 2023-10-25 NOTE — Assessment & Plan Note (Signed)
 See obesity treatment plan

## 2023-10-25 NOTE — Assessment & Plan Note (Signed)
Most recent A1c is  Lab Results  Component Value Date   HGBA1C 5.7 (H) 05/22/2023   HGBA1C 5.3 10/16/2015    Patient aware of disease state and risk of progression. This may contribute to abnormal cravings, fatigue and diabetic complications without having diabetes.   We have discussed treatment options which include: losing 7 to 10% of body weight, increasing physical activity to a goal of 150 minutes a week at moderate intensity.  Advised to maintain a diet low on simple and processed carbohydrates.  She may also be a candidate for pharmacoprophylaxis with metformin or incretin mimetic.

## 2023-10-25 NOTE — Assessment & Plan Note (Signed)
Most recent vitamin D levels  Lab Results  Component Value Date   VD25OH 27 (L) 08/24/2022   VD25OH 19.16 (L) 01/27/2017   VD25OH 16.12 (L) 08/12/2016     Deficiency state associated with adiposity and may result in leptin resistance, weight gain and fatigue. Currently on vitamin D supplementation without any adverse effects.  Plan: Check vitamin D levels today for adequacy

## 2023-10-25 NOTE — Progress Notes (Signed)
Office: [phone]  /  Fax: [phone]   Subjective   Initial Visit  Martha Page (MR# [id number]) is a 42 y.o. female who presents for evaluation and treatment of obesity and related comorbidities. Current BMI is Body mass index is 35.02 kg/m. Martha Page has been struggling with her weight for many years and has been unsuccessful in either losing weight, maintaining weight loss, or reaching her healthy weight goal.  Martha Page is currently in the action stage of change and ready to dedicate time achieving and maintaining a healthier weight. Martha Page is interested in becoming our patient and working on intensive lifestyle modifications including (but not limited to) diet and exercise for weight loss.  When asked how their weight has affected their life and health, she states: Has affected self-esteem, Contributed to medical problems, Having fatigue, Having poor endurance, Problems with eating patterns, and Has affected mood   When asked what else they would like to accomplish? She states: Adopt healthier eating patterns, Improve energy levels and physical activity, Improve existing medical conditions, Improve quality of life, and Improve self-confidence  Weight history:  She starting to note weight gain during : pregnancy.  Life events associated with weight gain include : pregnancy, job change, and loss of mother and brother .   Other contributing factors: Family history of obesity, Disruption of circadian rhythm / sleep disordered breathing, Consumption of processed foods, Use of obesogenic medications: Psychotropic medications and Anticholinergics, Moderate to high levels of stress, Reduced physical activity, Eating patterns, Mental health problems, and Strong orexigenic signaling and inadequate inhibitory control .  Their highest weight has been:  222 lbs.  Desired weight: 175  Previous weight-loss programs : Weight Watchers and Low Carb.  Their maximum weight loss was:  40  lbs.  Their greatest challenge with dieting:  sugar cravings .  Weight promoting medications identified: Psychotropic medications and Anticholinergics  Current or previous pharmacotherapy: Phentermine and Buproprion.  Response to medication: Lost weight initially but was unable to sustain weight loss  Nutritional History:  Current nutrition plan: None.  How many times do you eat outside the home: 5-7 per week  How often do they eat breakfast : 3-5 days a week.  Number of times they eat per day: 3  What beverages do they drink: diet soda > 7 per week.   Use of artificial sweetners : Yes  Food intolerances or dislikes:  beans and vegetables .  Food triggers: Stress, Seeking reward, and To help comfort self.  Food cravings: Sugary  Do they struggle with excessive hunger or portion control : Yes   Current level of physical activity: None  Past medical history includes:   Past Medical History:  Diagnosis Date   Acne    iPledge: [phone]   Anxiety    Bursitis of right hip    Chronic constipation    Cluster headaches    Depression    Dermatitis    eczema   Elevated liver enzymes    Frequent headaches    GERD (gastroesophageal reflux disease)    Hx of dysplastic nevus 04/02/2013   Left distal knee. Mild atypia, margins free   Insomnia    Left knee pain    Migraine    2x/6 mos   Obesity    OSA (obstructive sleep apnea)    Osteoarthritis    Overactive bladder    Pneumonia 06/30/2016   has finished antibiotics.  still has lingering nighttime cough   POTS (postural orthostatic tachycardia syndrome)  Recurrent oral ulcers    Shoulder pain    Sleep apnea    uses CPAP   Vitamin B 12 deficiency    Vitamin D deficiency      Objective   BP 97/67   Pulse 82   Temp 98.2 F (36.8 C)   Ht 5\' 6"  (1.676 m)   Wt 217 lb (98.4 kg)   LMP  (LMP Unknown)   SpO2 98%   BMI 35.02 kg/m  She was weighed on the bioimpedance scale: Body mass index is 35.02 kg/m.     Anthropometrics:  Vitals Temp: 98.2 F (36.8 C) BP: 97/67 Pulse Rate: 82 SpO2: 98 %   Anthropometric Measurements Height: 5\' 6"  (1.676 m) Weight: 217 lb (98.4 kg) BMI (Calculated): 35.04 Starting Weight: 217 lb Peak Weight: 226 lb Waist Measurement : 45 inches   Body Composition  Body Fat %: 44.5 % Fat Mass (lbs): 96.8 lbs Muscle Mass (lbs): 114.6 lbs Total Body Water (lbs): 80.6 lbs Visceral Fat Rating : 11   Other Clinical Data RMR: 1987 Fasting: Yes Labs: Yes Today's Visit #: 1 Starting Date: 10/25/23    Physical Exam:  General: She is overweight, cooperative, alert, well developed, and in no acute distress. PSYCH: Has normal mood, affect and thought process.   HEENT: EOMI, sclerae are anicteric. Lungs: Normal breathing effort, no conversational dyspnea. Extremities: No edema.  Neurologic: No gross sensory or motor deficits. No tremors or fasciculations noted.    Diagnostic Data Reviewed  EKG: Normal sinus rhythm, rate 81 BPM. No conduction abnormalities, abnormal Q waves or chamber enlargement.  Indirect Calorimeter completed today shows a VO2 of 289 and a REE of 1987.  Her calculated basal metabolic rate is 1324 thus her resting energy expenditure slower than calculated.  Depression Screen  Martha Page's PHQ-9 score was: 19.     05/22/2023   10:18 AM  Depression screen PHQ 2/9  Decreased Interest 1  Down, Depressed, Hopeless 0  PHQ - 2 Score 1    Screening for Sleep Related Breathing Disorders  Martha Page admits to daytime somnolence and admits to waking up still tired. Patient has a history of symptoms of daytime fatigue and morning fatigue. Martha Page generally gets 6 or 8 hours of sleep per night, and states that she has generally restful sleep if using CPAP. Snoring is present if not using CPAP. Apneic episodes are present. Epworth Sleepiness Score is 10.   BMET    Component Value Date/Time   NA 136 05/22/2023 1015   NA 137 12/02/2021 1557   NA  137 05/26/2014 1855   K 4.3 05/22/2023 1015   K 3.3 (L) 05/26/2014 1855   CL 104 05/22/2023 1015   CL 105 05/26/2014 1855   CO2 24 05/22/2023 1015   CO2 20 (L) 05/26/2014 1855   GLUCOSE 92 05/22/2023 1015   GLUCOSE 131 (H) 05/26/2014 1855   BUN 10 05/22/2023 1015   BUN 12 12/02/2021 1557   BUN 5 (L) 05/26/2014 1855   CREATININE 0.81 05/22/2023 1015   CALCIUM 9.6 05/22/2023 1015   CALCIUM 8.4 (L) 05/26/2014 1855   GFRNONAA >60 01/24/2018 1543   GFRNONAA >60 05/26/2014 1855   GFRAA >60 01/24/2018 1543   GFRAA >60 05/26/2014 1855   Lab Results  Component Value Date   HGBA1C 5.7 (H) 05/22/2023   HGBA1C 5.3 10/16/2015   No results found for: "INSULIN" CBC    Component Value Date/Time   WBC 8.6 05/22/2023 1015   RBC 4.65 05/22/2023 1015  HGB 13.3 05/22/2023 1015   HGB 13.5 12/02/2021 1557   HCT 40.5 05/22/2023 1015   HCT 39.5 12/02/2021 1557   PLT 121 (L) 05/22/2023 1015   PLT 227 12/02/2021 1557   MCV 87.1 05/22/2023 1015   MCV 83 12/02/2021 1557   MCV 87 05/26/2014 1855   MCH 28.6 05/22/2023 1015   MCHC 32.8 05/22/2023 1015   RDW 13.1 05/22/2023 1015   RDW 13.5 12/02/2021 1557   RDW 18.2 (H) 05/26/2014 1855   Iron/TIBC/Ferritin/ %Sat No results found for: "IRON", "TIBC", "FERRITIN", "IRONPCTSAT" Lipid Panel     Component Value Date/Time   CHOL 165 05/22/2023 1015   TRIG 114 05/22/2023 1015   HDL 50 05/22/2023 1015   CHOLHDL 3.3 05/22/2023 1015   VLDL 13.4 10/16/2015 0910   LDLCALC 94 05/22/2023 1015   Hepatic Function Panel     Component Value Date/Time   PROT 7.9 05/22/2023 1015   PROT 7.4 12/02/2021 1557   PROT 7.5 12/02/2013 1017   ALBUMIN 4.4 12/02/2021 1557   ALBUMIN 3.4 12/02/2013 1017   AST 14 05/22/2023 1015   AST 21 12/02/2013 1017   ALT 24 05/22/2023 1015   ALT 49 12/02/2013 1017   ALKPHOS 121 12/02/2021 1557   ALKPHOS 90 12/02/2013 1017   BILITOT 0.4 05/22/2023 1015   BILITOT 0.4 12/02/2021 1557   BILITOT 0.3 12/02/2013 1017   BILIDIR  0.1 01/27/2017 0844      Component Value Date/Time   TSH 1.44 08/24/2022 1123     Assessment and Plan   TREATMENT PLAN FOR OBESITY:  Recommended Dietary Goals  Martha Page is currently in the action stage of change. As such, her goal is to implement medically supervised weight loss plan.  She has agreed to implement: the Category 2 plan - 1200 kcal per day  Behavioral Intervention  We discussed the following Behavioral Modification Strategies today: increasing lean protein intake to established goals, decreasing simple carbohydrates , increasing vegetables, increasing lower glycemic fruits, increasing fiber rich foods, avoiding skipping meals, increasing water intake, work on meal planning and preparation, reading food labels , keeping healthy foods at home, identifying sources and decreasing liquid calories, decreasing eating out or consumption of processed foods, and making healthy choices when eating convenient foods, planning for success, and better snacking choices  Additional resources provided today:  Category 2 packet and supporting documents  Recommended Physical Activity Goals  Martha Page has been advised to work up to 150 minutes of moderate intensity aerobic activity a week and strengthening exercises 2-3 times per week for cardiovascular health, weight loss maintenance and preservation of muscle mass.   She has agreed to :  Think about enjoyable ways to increase daily physical activity and overcoming barriers to exercise and Increase physical activity in their day and reduce sedentary time (increase NEAT).  Pharmacotherapy We will work on building a Therapist, art and behavioral strategies. We will discuss the role of pharmacotherapy as an adjunct at subsequent visits.   ASSOCIATED CONDITIONS ADDRESSED TODAY  Other Fatigue Martha Page admits to daytime somnolence and admits to waking up still tired. Patient has a history of symptoms of daytime fatigue and  morning fatigue. Martha Page generally gets 6 or 8 hours of sleep per night, and states that she has generally restful sleep if using CPAP. Snoring is present if not using CPAP. Apneic episodes are present. Epworth Sleepiness Score is 10. Martha Page does feel that her weight is causing her energy to be lower than it should be.  Fatigue may be related to obesity, depression or many other causes. Labs will be ordered, and in the meanwhile, Avilene will focus on self care including making healthy food choices, increasing physical activity and focusing on stress reduction.  Shortness of Breath Martha Page notes increasing shortness of breath with exercising and seems to be worsening over time with weight gain. She notes getting out of breath sooner with activity than she used to. This has not gotten worse recently. Corinda denies shortness of breath at rest or orthopnea.Martha Page notes increasing shortness of breath with exercising and seems to be worsening over time with weight gain. She notes getting out of breath sooner with activity than she used to. This has not gotten worse recently. Martha Page denies shortness of breath at rest or orthopnea.  Other fatigue -     EKG 12-Lead  SOB (shortness of breath) on exertion  Depression screen  OSA (obstructive sleep apnea) on CPAP Assessment & Plan: On CPAP with reported good compliance. Continue PAP therapy. Losing 15% or more of body weight may improve AHI.      Prediabetes Assessment & Plan: Most recent A1c is  Lab Results  Component Value Date   HGBA1C 5.7 (H) 05/22/2023   HGBA1C 5.3 10/16/2015    Patient aware of disease state and risk of progression. This may contribute to abnormal cravings, fatigue and diabetic complications without having diabetes.   We have discussed treatment options which include: losing 7 to 10% of body weight, increasing physical activity to a goal of 150 minutes a week at moderate intensity.  Advised to maintain a diet low on simple  and processed carbohydrates.  She may also be a candidate for pharmacoprophylaxis with metformin or incretin mimetic.     Orders: -     Hemoglobin A1c -     CMP14+EGFR -     Insulin, random  Class 2 severe obesity due to excess calories with serious comorbidity and body mass index (BMI) of 36.0 to 36.9 in adult [facility] Assessment & Plan: See obesity treatment plan   B12 deficiency Assessment & Plan: We will check serum methylmalonic acid to check status of B12 supplementation.  She does parenteral B12.  Orders: -     Methylmalonic acid, serum  Vitamin D deficiency Assessment & Plan: Most recent vitamin D levels  Lab Results  Component Value Date   VD25OH 27 (L) 08/24/2022   VD25OH 19.16 (L) 01/27/2017   VD25OH 16.12 (L) 08/12/2016     Deficiency state associated with adiposity and may result in leptin resistance, weight gain and fatigue. Currently on vitamin D supplementation without any adverse effects.  Plan: Check vitamin D levels today for adequacy   Orders: -     VITAMIN D 25 Hydroxy (Vit-D Deficiency, Fractures)    Follow-up  She was informed of the importance of frequent follow-up visits to maximize her success with intensive lifestyle modifications for her multiple health conditions. She was informed we would discuss her lab results at her next visit unless there is a critical issue that needs to be addressed sooner. Martha Page agreed to keep her next visit at the agreed upon time to discuss these results.  Attestation Statement  This is the patient's intake visit at [facility]. The patient's Health Questionnaire was reviewed at length. Included in the packet: current and past health history, medications, allergies, ROS, gynecologic history (women only), surgical history, family history, social history, weight history, weight loss surgery history (for those that have had weight  loss surgery), nutritional evaluation, mood and food questionnaire,  PHQ9, Epworth questionnaire, sleep habits questionnaire, patient life and health improvement goals questionnaire. These will all be scanned into the patient's chart under media.   During the visit, I independently reviewed the patient's EKG, previous labs, bioimpedance scale results, and indirect calorimetry results. I used this information to medically tailor a meal plan for the patient that will help her to lose weight and will improve her obesity-related conditions. I performed a medically necessary appropriate examination and/or evaluation. I discussed the assessment and treatment plan with the patient. The patient was provided an opportunity to ask questions and all were answered. The patient agreed with the plan and demonstrated an understanding of the instructions. Labs were ordered at this visit and will be reviewed at the next visit unless critical results need to be addressed immediately. Clinical information was updated and documented in the EMR.   In addition, they received basic education on identification of processed foods and reduction of these, different sources of lean proteins and complex carbohydrates and how to eat balanced by incorporation of whole foods.  Reviewed by clinician on day of visit: allergies, medications, problem list, medical history, surgical history, family history, social history, and previous encounter notes.  I have spent 60 minutes in the care of the patient today including: preparing to see patient (e.g. review and interpretation of tests, old notes ), obtaining and/or reviewing separately obtained history, performing a medically appropriate examination or evaluation, counseling and educating the patient, documenting clinical information in the electronic or other health care record, and independently interpreting results and communicating results to the patient, family, or caregiver      Worthy Rancher, MD

## 2023-10-25 NOTE — Assessment & Plan Note (Signed)
On CPAP with reported good compliance. Continue PAP therapy. Losing 15% or more of body weight may improve AHI.    

## 2023-10-31 LAB — CMP14+EGFR
ALT: 34 [IU]/L — ABNORMAL HIGH (ref 0–32)
AST: 19 [IU]/L (ref 0–40)
Albumin: 4.1 g/dL (ref 3.9–4.9)
Alkaline Phosphatase: 172 [IU]/L — ABNORMAL HIGH (ref 44–121)
BUN/Creatinine Ratio: 9 (ref 9–23)
BUN: 8 mg/dL (ref 6–24)
Bilirubin Total: 0.3 mg/dL (ref 0.0–1.2)
CO2: 21 mmol/L (ref 20–29)
Calcium: 9.1 mg/dL (ref 8.7–10.2)
Chloride: 104 mmol/L (ref 96–106)
Creatinine, Ser: 0.85 mg/dL (ref 0.57–1.00)
Globulin, Total: 2.9 g/dL (ref 1.5–4.5)
Glucose: 83 mg/dL (ref 70–99)
Potassium: 4.3 mmol/L (ref 3.5–5.2)
Sodium: 138 mmol/L (ref 134–144)
Total Protein: 7 g/dL (ref 6.0–8.5)
eGFR: 87 mL/min/{1.73_m2} (ref >59)

## 2023-10-31 LAB — INSULIN, RANDOM: INSULIN: 11 u[IU]/mL (ref 2.6–24.9)

## 2023-10-31 LAB — HEMOGLOBIN A1C
Est. average glucose Bld gHb Est-mCnc: 117 mg/dL
Hgb A1c MFr Bld: 5.7 % — ABNORMAL HIGH (ref 4.8–5.6)

## 2023-10-31 LAB — METHYLMALONIC ACID, SERUM: Methylmalonic Acid: 113 nmol/L (ref 0–378)

## 2023-10-31 LAB — VITAMIN D 25 HYDROXY (VIT D DEFICIENCY, FRACTURES): Vit D, 25-Hydroxy: 19 ng/mL — ABNORMAL LOW (ref 30.0–100.0)

## 2023-11-02 ENCOUNTER — Ambulatory Visit: Payer: 59 | Admitting: Dermatology

## 2023-11-02 ENCOUNTER — Encounter: Payer: Self-pay | Admitting: Dermatology

## 2023-11-02 VITALS — BP 109/76 | HR 85 | Temp 98.7°F

## 2023-11-02 DIAGNOSIS — H0265 Xanthelasma of left lower eyelid: Secondary | ICD-10-CM

## 2023-11-02 DIAGNOSIS — H0262 Xanthelasma of right lower eyelid: Secondary | ICD-10-CM | POA: Diagnosis not present

## 2023-11-02 DIAGNOSIS — H0261 Xanthelasma of right upper eyelid: Secondary | ICD-10-CM | POA: Diagnosis not present

## 2023-11-02 DIAGNOSIS — H0264 Xanthelasma of left upper eyelid: Secondary | ICD-10-CM | POA: Diagnosis not present

## 2023-11-02 DIAGNOSIS — H0260 Xanthelasma of unspecified eye, unspecified eyelid: Secondary | ICD-10-CM

## 2023-11-02 NOTE — Progress Notes (Signed)
   New Patient Visit   Subjective  Martha Page is a 42 y.o. 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 Katrinka Blazing. Patient has had these lesions for several years. The are symptomatic but growing. She would like to get them removed.   The following portions of the chart were reviewed this encounter and updated as appropriate: medications, allergies, medical history  Review of Systems:  No other skin or systemic complaints except as noted in HPI or Assessment and Plan.  Objective  Well appearing patient in no apparent distress; mood and affect are within normal limits.  A focused examination was performed of the following areas: Bilateral eyes  Relevant exam findings are noted in the Assessment and Plan.    Assessment & Plan    Xanthelasma- Bilateral Upper and Lower Medial Eyelids The patient was seen for evaluation of xanthelasma, a condition characterized by yellowish plaques of cholesterol-laden material, typically found around the eyelids. Treatment options were discussed, including both non-invasive and surgical approaches. Non-invasive options such as topical treatments (e.g., statin creams or corticosteroid ointments) may offer some benefit but are often less effective for complete resolution. Cryotherapy, which involves freezing the lesions, can be effective but may cause skin irritation or hypopigmentation. Laser therapy, particularly with CO2 or Er:YAG lasers, can also be used to remove the lesions with minimal scarring, though multiple sessions may be required. For larger or more persistent lesions, surgical excision is considered the most definitive treatment, though it carries the risks of scarring, recurrence, and potential damage to surrounding structures, especially in the delicate periocular region. We also discussed the importance of addressing any underlying lipid abnormalities, as xanthelasma may be  associated with hyperlipidemia or other metabolic conditions, and managing these could potentially reduce the likelihood of recurrence. The patient expressed interest in further exploring laser therapy and is considering follow-up for treatment options. Discussed that laser was not an option without our office.   The patient was evaluated for excision of xanthelasma, characterized by yellowish plaques around the eyelids. After discussing various treatment options, we decided that surgical excision would be the most appropriate approach due to the size and persistence of the lesions. I reviewed the procedure with the patient, explaining that excision involves careful removal of the plaques. The risks of excision were discussed, including scarring, potential recurrence of the xanthelasma, and the possibility of damage to the delicate skin around the eyes, as well as the risks of infection and wound dehiscence. The patient was informed that excision would offer the most definitive and immediate removal of the lesions, with a good chance of aesthetic improvement. We also discussed the importance of addressing any underlying lipid abnormalities, as managing hyperlipidemia could reduce the risk of recurrence. The patient expressed understanding of the procedure and risks and agreed to proceed with scheduling the excision.  Excisions 1% lidocaine with epinephrine infiltrated into the lesions. A Westcott Eyelid Scissors was used to delicately excise the lesions.  Size: 0.3 x 0.2 Quantity: 5 Electrocautery was used for hemostasis and the lesions were dressed with petrolatum.  Return in about 4 weeks (around 11/30/2023) for wound check.  I, Tillie Fantasia, CMA, am acting as scribe for Gwenith Daily, MD.   Documentation: I have reviewed the above documentation for accuracy and completeness, and I agree with the above.  Gwenith Daily, MD

## 2023-11-02 NOTE — Patient Instructions (Signed)

## 2023-11-06 ENCOUNTER — Encounter (INDEPENDENT_AMBULATORY_CARE_PROVIDER_SITE_OTHER): Payer: Self-pay | Admitting: Internal Medicine

## 2023-11-06 ENCOUNTER — Ambulatory Visit: Payer: 59 | Admitting: Podiatry

## 2023-11-06 ENCOUNTER — Ambulatory Visit (INDEPENDENT_AMBULATORY_CARE_PROVIDER_SITE_OTHER): Payer: 59 | Admitting: Internal Medicine

## 2023-11-06 VITALS — BP 121/86 | HR 100 | Temp 98.2°F | Ht 66.0 in | Wt 218.0 lb

## 2023-11-06 DIAGNOSIS — E559 Vitamin D deficiency, unspecified: Secondary | ICD-10-CM

## 2023-11-06 DIAGNOSIS — R7303 Prediabetes: Secondary | ICD-10-CM

## 2023-11-06 DIAGNOSIS — Z6836 Body mass index (BMI) 36.0-36.9, adult: Secondary | ICD-10-CM

## 2023-11-06 DIAGNOSIS — K760 Fatty (change of) liver, not elsewhere classified: Secondary | ICD-10-CM | POA: Insufficient documentation

## 2023-11-06 DIAGNOSIS — E66812 Obesity, class 2: Secondary | ICD-10-CM

## 2023-11-06 DIAGNOSIS — G4733 Obstructive sleep apnea (adult) (pediatric): Secondary | ICD-10-CM | POA: Diagnosis not present

## 2023-11-06 DIAGNOSIS — R748 Abnormal levels of other serum enzymes: Secondary | ICD-10-CM | POA: Insufficient documentation

## 2023-11-06 DIAGNOSIS — E7800 Pure hypercholesterolemia, unspecified: Secondary | ICD-10-CM

## 2023-11-06 MED ORDER — VITAMIN D (ERGOCALCIFEROL) 1.25 MG (50000 UNIT) PO CAPS: 50000.0000 [IU] | ORAL_CAPSULE | ORAL | 0 refills | Status: DC

## 2023-11-06 NOTE — Assessment & Plan Note (Signed)
Most recent vitamin D levels  Lab Results  Component Value Date   VD25OH 19.0 (L) 10/25/2023   VD25OH 27 (L) 08/24/2022   VD25OH 19.16 (L) 01/27/2017     Deficiency state associated with adiposity and may result in leptin resistance, weight gain and fatigue. Currently on vitamin D supplementation without any adverse effects.  Plan: After discussion of benefits, alternative treatment options and side effects patient will be started on vitamin D2 50,000 units 1 tablet weekly for 3-4 months. for a treatment goal level of 50-60 mg/dl. Check levels at that time for response monitoring.

## 2023-11-06 NOTE — Assessment & Plan Note (Signed)
 See obesity treatment plan

## 2023-11-06 NOTE — Assessment & Plan Note (Signed)
LDL is at goal. Elevated LDL may be secondary to nutrition, genetics and spillover effect from excess adiposity. Recommended LDL goal is <70 to reduce the risk of fatty streaks and the progression to obstructive ASCVD in the future.   Her 10 year risk is: The 10-year ASCVD risk score (Arnett DK, et al., 2019) is: 0.6%  Lab Results  Component Value Date   CHOL 165 05/22/2023   HDL 50 05/22/2023   LDLCALC 94 05/22/2023   TRIG 114 05/22/2023   CHOLHDL 3.3 05/22/2023    Continue weight loss therapy, losing 10% or more of body weight may improve condition. Also advised to reduce saturated fats in diet to less than 10% of daily calories.

## 2023-11-06 NOTE — Assessment & Plan Note (Signed)
I reviewed previous labs she has had multiple elevations in the past.  She denies risk factor for viral hepatitis and drinks alcohol infrequently.  She had some family members that were alcoholics and one of them may have had liver disease.  She had negative hepatitis serologies and autoimmune workup but not for autoimmune liver disease.  Abdominal imaging in the past showed hepatic steatosis.  She also had a hepatic cyst.  I will like to obtain a follow-up ultrasound of the liver.  We will also complete first tier evaluation please refer to orders.  We discussed the importance of maintaining a diet low in saturated fats and simple and added sugars.  Losing 10 to 15% of body weight may improve condition.  She is also a good candidate for GLP-1 therapy.

## 2023-11-06 NOTE — Progress Notes (Signed)
Office: [phone]  /  Fax: [phone]  Weight Summary And Biometrics  Vitals Temp: 98.2 F (36.8 C) BP: 121/86 Pulse Rate: 100 SpO2: 98 %   Anthropometric Measurements Height: 5\' 6"  (1.676 m) Weight: 218 lb (98.9 kg) BMI (Calculated): 35.2 Weight at Last Visit: 217lb Weight Lost Since Last Visit: 0lb Weight Gained Since Last Visit: 1lb Starting Weight: 217lb Total Weight Loss (lbs): 0 lb (0 kg) Peak Weight: 226lb   Body Composition  Body Fat %: 44.2 % Fat Mass (lbs): 96.8 lbs Muscle Mass (lbs): 115.8 lbs Total Body Water (lbs): 79.2 lbs Visceral Fat Rating : 11    No data recorded Today's Visit #: 2  Starting Date: 10/25/23   Subjective   Chief Complaint: Obesity  Yuritzi is here to discuss her progress with her obesity treatment plan. She is on the the Category 2 Plan and states she is following her eating plan approximately 50 % of the time. She states she is exercising 0 minutes 0 times per week.  Interval History:   Discussed the use of AI scribe software for clinical note transcription with the patient, who gave verbal consent to proceed.  History of Present Illness   Patient presents today for medical weight management.  She is currently on weight-promoting medications, including trazodone and Effexor.  She reports a shift towards healthier eating habits, incorporating more salads into her diet, particularly during a recent period of working from home due to personal and family health issues.  The patient has a history of migraines, which continue to affect her. She also has a history of elevated liver enzymes, which have been attributed to multiple pregnancies in the past. However, a recent blood work showed a slight elevation in liver enzymes, prompting a need for further investigation. The patient denies any risk factors for hepatitis B or C, and reports only occasional alcohol consumption.  The patient's blood work also revealed prediabetes,  with an A1c of 5.7 and insulin levels of 11, indicating a potential fatigue in the pancreas. The patient also has a diagnosis of fatty liver disease, which is part of the obesity spectrum and is sensitive to carbohydrate intake.  The patient is committed to improving her health through dietary changes, focusing on reducing simple sugars and saturated fats, and increasing protein and fiber intake. She is also considering the use of a nutrition app to assess the healthiness of her food choices. The patient is open to the idea of medication to assist with weight loss, and plans to research options further.     Orexigenic Control:  Reports problems with appetite and hunger signals.  Denies problems with satiety and satiation.  Denies problems with eating patterns and portion control.  Reports abnormal cravings. Denies feeling deprived or restricted.   Barriers identified: low volume of physical activity at present , presence of obesogenic drugs, and sleep apnea.   Pharmacotherapy for weight loss: She is currently taking no anti-obesity medication.   Assessment and Plan   Treatment Plan For Obesity:  Recommended Dietary Goals  Any is currently in the action stage of change. As such, her goal is to continue weight management plan. She has agreed to: continue current plan  Behavioral Intervention  We discussed the following Behavioral Modification Strategies today: continue to work on maintaining a reduced calorie state, getting the recommended amount of protein, incorporating whole foods, making healthy choices, staying well hydrated and practicing mindfulness when eating..  Additional resources provided today: None  Recommended Physical Activity  Goals  Lorilynn has been advised to work up to 150 minutes of moderate intensity aerobic activity a week and strengthening exercises 2-3 times per week for cardiovascular health, weight loss maintenance and preservation of muscle mass.   She  has agreed to :  Think about enjoyable ways to increase daily physical activity and overcoming barriers to exercise and Increase physical activity in their day and reduce sedentary time (increase NEAT).  Pharmacotherapy  We discussed various medication options to help Ravina with her weight loss efforts and we both agreed to :  Patient is a good candidate for GLP-1 therapy we will discuss initiation at the next office visit  Associated Conditions Addressed and Impacted by Obesity Treatment  Vitamin D deficiency Assessment & Plan: Most recent vitamin D levels  Lab Results  Component Value Date   VD25OH 19.0 (L) 10/25/2023   VD25OH 27 (L) 08/24/2022   VD25OH 19.16 (L) 01/27/2017     Deficiency state associated with adiposity and may result in leptin resistance, weight gain and fatigue. Currently on vitamin D supplementation without any adverse effects.  Plan: After discussion of benefits, alternative treatment options and side effects patient will be started on vitamin D2 50,000 units 1 tablet weekly for 3-4 months. for a treatment goal level of 50-60 mg/dl. Check levels at that time for response monitoring.    Class 2 severe obesity due to excess calories with serious comorbidity and body mass index (BMI) of 36.0 to 36.9 in adult [facility]) Assessment & Plan: See obesity treatment plan   Prediabetes Assessment & Plan: Most recent A1c is  Lab Results  Component Value Date   HGBA1C 5.7 (H) 10/25/2023   HGBA1C 5.3 10/16/2015    Patient aware of disease state and risk of progression. This may contribute to abnormal cravings, fatigue and diabetic complications without having diabetes.   We have discussed treatment options which include: losing 7 to 10% of body weight, increasing physical activity to a goal of 150 minutes a week at moderate intensity.  Advised to maintain a diet low on simple and processed carbohydrates.  She may also be a candidate for pharmacoprophylaxis with  metformin or incretin mimetic.     OSA (obstructive sleep apnea) Assessment & Plan: Patient reports having severe sleep apnea on an in-house sleep study.  She reports good compliance with PAP therapy.  Losing 15% of body weight may reduce AHI she is also a good candidate for GLP-1 therapy we briefly discussed the role of Zepbound and weight management she will read more about medication.  We will discuss further at the next office visit   Abnormal liver enzymes -     CMP14+EGFR -     Gamma GT -     Hepatitis B core antibody, IgM -     Hepatitis B surface antibody,qualitative -     Hepatitis B surface antigen -     Protime-INR -     Iron and TIBC -     Ferritin -     US ABDOMEN LIMITED RUQ (LIVER/GB); Future  Metabolic dysfunction-associated steatotic liver disease (MASLD) Assessment & Plan: I reviewed previous labs she has had multiple elevations in the past.  She denies risk factor for viral hepatitis and drinks alcohol infrequently.  She had some family members that were alcoholics and one of them may have had liver disease.  She had negative hepatitis serologies and autoimmune workup but not for autoimmune liver disease.  Abdominal imaging in the past showed hepatic steatosis.  She also had a hepatic cyst.  I will like to obtain a follow-up ultrasound of the liver.  We will also complete first tier evaluation please refer to orders.  We discussed the importance of maintaining a diet low in saturated fats and simple and added sugars.  Losing 10 to 15% of body weight may improve condition.  She is also a good candidate for GLP-1 therapy.   Pure hypercholesterolemia Assessment & Plan: LDL is at goal. Elevated LDL may be secondary to nutrition, genetics and spillover effect from excess adiposity. Recommended LDL goal is <70 to reduce the risk of fatty streaks and the progression to obstructive ASCVD in the future.   Her 10 year risk is: The 10-year ASCVD risk score (Arnett DK, et al.,  2019) is: 0.6%  Lab Results  Component Value Date   CHOL 165 05/22/2023   HDL 50 05/22/2023   LDLCALC 94 05/22/2023   TRIG 114 05/22/2023   CHOLHDL 3.3 05/22/2023    Continue weight loss therapy, losing 10% or more of body weight may improve condition. Also advised to reduce saturated fats in diet to less than 10% of daily calories.        Other orders -     Vitamin D (Ergocalciferol); Take 1 capsule (50,000 Units total) by mouth every 7 (seven) days.  Dispense: 16 capsule; Refill: 0     Objective   Physical Exam:  Blood pressure 121/86, pulse 100, temperature 98.2 F (36.8 C), height 5\' 6"  (1.676 m), weight 218 lb (98.9 kg), SpO2 98%. Body mass index is 35.19 kg/m.  General: She is overweight, cooperative, alert, well developed, and in no acute distress. PSYCH: Has normal mood, affect and thought process.   HEENT: EOMI, sclerae are anicteric. Lungs: Normal breathing effort, no conversational dyspnea. Extremities: No edema.  Neurologic: No gross sensory or motor deficits. No tremors or fasciculations noted.    Diagnostic Data Reviewed:  BMET    Component Value Date/Time   NA 138 10/25/2023 0940   NA 137 05/26/2014 1855   K 4.3 10/25/2023 0940   K 3.3 (L) 05/26/2014 1855   CL 104 10/25/2023 0940   CL 105 05/26/2014 1855   CO2 21 10/25/2023 0940   CO2 20 (L) 05/26/2014 1855   GLUCOSE 83 10/25/2023 0940   GLUCOSE 92 05/22/2023 1015   GLUCOSE 131 (H) 05/26/2014 1855   BUN 8 10/25/2023 0940   BUN 5 (L) 05/26/2014 1855   CREATININE 0.85 10/25/2023 0940   CREATININE 0.81 05/22/2023 1015   CALCIUM 9.1 10/25/2023 0940   CALCIUM 8.4 (L) 05/26/2014 1855   GFRNONAA >60 01/24/2018 1543   GFRNONAA >60 05/26/2014 1855   GFRAA >60 01/24/2018 1543   GFRAA >60 05/26/2014 1855   Lab Results  Component Value Date   HGBA1C 5.7 (H) 10/25/2023   HGBA1C 5.3 10/16/2015   Lab Results  Component Value Date   INSULIN 11.0 10/25/2023   Lab Results  Component Value Date    TSH 1.44 08/24/2022   CBC    Component Value Date/Time   WBC 8.6 05/22/2023 1015   RBC 4.65 05/22/2023 1015   HGB 13.3 05/22/2023 1015   HGB 13.5 12/02/2021 1557   HCT 40.5 05/22/2023 1015   HCT 39.5 12/02/2021 1557   PLT 121 (L) 05/22/2023 1015   PLT 227 12/02/2021 1557   MCV 87.1 05/22/2023 1015   MCV 83 12/02/2021 1557   MCV 87 05/26/2014 1855   MCH 28.6 05/22/2023 1015   MCHC  32.8 05/22/2023 1015   RDW 13.1 05/22/2023 1015   RDW 13.5 12/02/2021 1557   RDW 18.2 (H) 05/26/2014 1855   Iron Studies No results found for: "IRON", "TIBC", "FERRITIN", "IRONPCTSAT" Lipid Panel     Component Value Date/Time   CHOL 165 05/22/2023 1015   TRIG 114 05/22/2023 1015   HDL 50 05/22/2023 1015   CHOLHDL 3.3 05/22/2023 1015   VLDL 13.4 10/16/2015 0910   LDLCALC 94 05/22/2023 1015   Hepatic Function Panel     Component Value Date/Time   PROT 7.0 10/25/2023 0940   PROT 7.5 12/02/2013 1017   ALBUMIN 4.1 10/25/2023 0940   ALBUMIN 3.4 12/02/2013 1017   AST 19 10/25/2023 0940   AST 21 12/02/2013 1017   ALT 34 (H) 10/25/2023 0940   ALT 49 12/02/2013 1017   ALKPHOS 172 (H) 10/25/2023 0940   ALKPHOS 90 12/02/2013 1017   BILITOT 0.3 10/25/2023 0940   BILITOT 0.3 12/02/2013 1017   BILIDIR 0.1 01/27/2017 0844      Component Value Date/Time   TSH 1.44 08/24/2022 1123   Nutritional Lab Results  Component Value Date   VD25OH 19.0 (L) 10/25/2023   VD25OH 27 (L) 08/24/2022   VD25OH 19.16 (L) 01/27/2017    Follow-Up   Return in about 3 weeks (around 11/27/2023) for For Weight Mangement with Dr. Rikki Spearing.Marland Kitchen She was informed of the importance of frequent follow up visits to maximize her success with intensive lifestyle modifications for her multiple health conditions.  Attestation Statement   Reviewed by clinician on day of visit: allergies, medications, problem list, medical history, surgical history, family history, social history, and previous encounter notes.   I have spent  40 minutes in the care of the patient today including: preparing to see patient (e.g. review and interpretation of tests, old notes ), obtaining and/or reviewing separately obtained history, performing a medically appropriate examination or evaluation, counseling and educating the patient, ordering medications, test or procedures, documenting clinical information in the electronic or other health care record, and independently interpreting results and communicating results to the patient, family, or caregiver   Worthy Rancher, MD

## 2023-11-06 NOTE — Assessment & Plan Note (Signed)
Patient reports having severe sleep apnea on an in-house sleep study.  She reports good compliance with PAP therapy.  Losing 15% of body weight may reduce AHI she is also a good candidate for GLP-1 therapy we briefly discussed the role of Zepbound and weight management she will read more about medication.  We will discuss further at the next office visit

## 2023-11-06 NOTE — Assessment & Plan Note (Signed)
Most recent A1c is  Lab Results  Component Value Date   HGBA1C 5.7 (H) 10/25/2023   HGBA1C 5.3 10/16/2015    Patient aware of disease state and risk of progression. This may contribute to abnormal cravings, fatigue and diabetic complications without having diabetes.   We have discussed treatment options which include: losing 7 to 10% of body weight, increasing physical activity to a goal of 150 minutes a week at moderate intensity.  Advised to maintain a diet low on simple and processed carbohydrates.  She may also be a candidate for pharmacoprophylaxis with metformin or incretin mimetic.

## 2023-11-07 ENCOUNTER — Other Ambulatory Visit (INDEPENDENT_AMBULATORY_CARE_PROVIDER_SITE_OTHER): Payer: Self-pay | Admitting: Internal Medicine

## 2023-11-07 LAB — CMP14+EGFR
ALT: 35 [IU]/L — ABNORMAL HIGH (ref 0–32)
AST: 29 [IU]/L (ref 0–40)
Albumin: 4.2 g/dL (ref 3.9–4.9)
Alkaline Phosphatase: 133 [IU]/L — ABNORMAL HIGH (ref 44–121)
BUN/Creatinine Ratio: 18 (ref 9–23)
BUN: 13 mg/dL (ref 6–24)
Bilirubin Total: 0.3 mg/dL (ref 0.0–1.2)
CO2: 21 mmol/L (ref 20–29)
Calcium: 9.2 mg/dL (ref 8.7–10.2)
Chloride: 103 mmol/L (ref 96–106)
Creatinine, Ser: 0.74 mg/dL (ref 0.57–1.00)
Globulin, Total: 2.8 g/dL (ref 1.5–4.5)
Glucose: 81 mg/dL (ref 70–99)
Potassium: 4.4 mmol/L (ref 3.5–5.2)
Sodium: 137 mmol/L (ref 134–144)
Total Protein: 7 g/dL (ref 6.0–8.5)
eGFR: 102 mL/min/{1.73_m2} (ref >59)

## 2023-11-07 LAB — GAMMA GT: GGT: 90 [IU]/L — ABNORMAL HIGH (ref 0–60)

## 2023-11-07 LAB — HEPATITIS B CORE ANTIBODY, IGM: Hep B C IgM: NEGATIVE

## 2023-11-07 LAB — FERRITIN: Ferritin: 14 ng/mL — ABNORMAL LOW (ref 15–150)

## 2023-11-07 LAB — IRON AND TIBC
Iron Saturation: 23 % (ref 15–55)
Iron: 63 ug/dL (ref 27–159)
Total Iron Binding Capacity: 276 ug/dL (ref 250–450)
UIBC: 213 ug/dL (ref 131–425)

## 2023-11-07 LAB — HEPATITIS B SURFACE ANTIGEN: Hepatitis B Surface Ag: NEGATIVE

## 2023-11-07 LAB — HEPATITIS B SURFACE ANTIBODY,QUALITATIVE: Hep B Surface Ab, Qual: REACTIVE

## 2023-11-07 LAB — PROTIME-INR
INR: 1 (ref 0.9–1.2)
Prothrombin Time: 11.5 s (ref 9.1–12.0)

## 2023-11-09 ENCOUNTER — Encounter (INDEPENDENT_AMBULATORY_CARE_PROVIDER_SITE_OTHER): Payer: Self-pay | Admitting: Internal Medicine

## 2023-11-14 ENCOUNTER — Other Ambulatory Visit (INDEPENDENT_AMBULATORY_CARE_PROVIDER_SITE_OTHER): Payer: Self-pay | Admitting: Internal Medicine

## 2023-11-14 ENCOUNTER — Encounter: Payer: 59 | Admitting: Internal Medicine

## 2023-11-14 MED ORDER — FERROUS SULFATE 325 (65 FE) MG PO TABS: 325.0000 mg | ORAL_TABLET | Freq: Every day | ORAL | 0 refills | Status: DC

## 2023-11-20 ENCOUNTER — Ambulatory Visit: Payer: 59 | Admitting: Dermatology

## 2023-11-21 ENCOUNTER — Other Ambulatory Visit (HOSPITAL_BASED_OUTPATIENT_CLINIC_OR_DEPARTMENT_OTHER): Payer: 59

## 2023-11-22 ENCOUNTER — Ambulatory Visit: Payer: 59 | Admitting: Podiatry

## 2023-11-23 ENCOUNTER — Ambulatory Visit
Admission: RE | Admit: 2023-11-23 | Discharge: 2023-11-23 | Disposition: A | Discharge status: Home / Self Care | Payer: 59 | Source: Ambulatory Visit | Attending: Internal Medicine | Admitting: Internal Medicine

## 2023-11-23 DIAGNOSIS — R748 Abnormal levels of other serum enzymes: Secondary | ICD-10-CM | POA: Diagnosis present

## 2023-11-29 ENCOUNTER — Encounter: Payer: 59 | Admitting: Internal Medicine

## 2023-11-30 ENCOUNTER — Encounter: Payer: Self-pay | Admitting: Internal Medicine

## 2023-12-01 ENCOUNTER — Other Ambulatory Visit: Payer: Self-pay

## 2023-12-01 MED ORDER — IBUPROFEN 800 MG PO TABS: 800.0000 mg | ORAL_TABLET | Freq: Three times a day (TID) | ORAL | 0 refills | Status: DC | PRN

## 2023-12-03 ENCOUNTER — Other Ambulatory Visit (INDEPENDENT_AMBULATORY_CARE_PROVIDER_SITE_OTHER): Payer: Self-pay | Admitting: Internal Medicine

## 2023-12-04 ENCOUNTER — Ambulatory Visit
Admission: RE | Admit: 2023-12-04 | Discharge: 2023-12-04 | Disposition: A | Discharge status: Home / Self Care | Payer: 59 | Source: Ambulatory Visit | Attending: Obstetrics and Gynecology | Admitting: Obstetrics and Gynecology

## 2023-12-04 DIAGNOSIS — Z1231 Encounter for screening mammogram for malignant neoplasm of breast: Secondary | ICD-10-CM | POA: Insufficient documentation

## 2023-12-05 ENCOUNTER — Encounter (INDEPENDENT_AMBULATORY_CARE_PROVIDER_SITE_OTHER): Payer: Self-pay | Admitting: Internal Medicine

## 2023-12-05 ENCOUNTER — Ambulatory Visit (INDEPENDENT_AMBULATORY_CARE_PROVIDER_SITE_OTHER): Payer: 59 | Admitting: Internal Medicine

## 2023-12-05 VITALS — BP 115/76 | HR 77 | Temp 98.3°F | Ht 66.0 in | Wt 218.0 lb

## 2023-12-05 DIAGNOSIS — G4733 Obstructive sleep apnea (adult) (pediatric): Secondary | ICD-10-CM | POA: Diagnosis not present

## 2023-12-05 DIAGNOSIS — R7303 Prediabetes: Secondary | ICD-10-CM

## 2023-12-05 DIAGNOSIS — D1803 Hemangioma of intra-abdominal structures: Secondary | ICD-10-CM | POA: Diagnosis not present

## 2023-12-05 DIAGNOSIS — K760 Fatty (change of) liver, not elsewhere classified: Secondary | ICD-10-CM

## 2023-12-05 DIAGNOSIS — E66812 Obesity, class 2: Secondary | ICD-10-CM

## 2023-12-05 DIAGNOSIS — Z6836 Body mass index (BMI) 36.0-36.9, adult: Secondary | ICD-10-CM

## 2023-12-05 MED ORDER — ZEPBOUND 2.5 MG/0.5ML ~~LOC~~ SOAJ: 2.5000 mg | SUBCUTANEOUS | 0 refills | Status: DC

## 2023-12-05 NOTE — Assessment & Plan Note (Signed)
1 cm detected on ultrasound of the liver January 2025.  She will be due for follow-up surveillance in July 2025

## 2023-12-05 NOTE — Progress Notes (Signed)
Office: [phone]  /  Fax: [phone]  Weight Summary And Biometrics  Vitals Temp: 98.3 F (36.8 C) BP: 115/76 Pulse Rate: 77 SpO2: 97 %   Anthropometric Measurements Height: 5\' 6"  (1.676 m) Weight: 218 lb (98.9 kg) BMI (Calculated): 35.2 Weight at Last Visit: 2118 lb Weight Lost Since Last Visit: 0 lb Weight Gained Since Last Visit: 0 lb Starting Weight: 217 lb Total Weight Loss (lbs): 0 lb (0 kg) Peak Weight: 226 lb   Body Composition  Body Fat %: 44.4 % Fat Mass (lbs): 96.8 lbs Muscle Mass (lbs): 115 lbs Total Body Water (lbs): 81 lbs Visceral Fat Rating : 11    No data recorded Today's Visit #: 3  Starting Date: 11/24/22   Subjective   Chief Complaint: Obesity  Martha Page is here to discuss her progress with her obesity treatment plan. She is on the the Category 2 Plan and states she is following her eating plan approximately 50 % of the time. She states she is not exercising.  Weight Progress Since Last Visit:  Discussed the use of AI scribe software for clinical note transcription with the patient, who gave verbal consent to proceed.  History of Present Illness   The patient, with obesity, MASLD, prediabetes, hypercholesterolemia, and severe sleep apnea, presents for medical weight management.  She struggles to maintain a caloric intake of 1200 calories per day, often consuming around 1500 calories, which is below her metabolic rate of 2956 calories but above the target for weight loss. Strong hunger signals make it difficult to reduce caloric intake further. Her diet includes fruits and vegetables, with an apple daily and either salad or broccoli with lunch and dinner, estimating that these make up close to half of her plate. She drinks two to three 40-ounce cups of water daily and consumes about 90 grams of protein per day, occasionally using [employer]life protein shakes as a meal replacement. Despite these efforts, she experiences significant hunger,  particularly after lunch.  She is currently taking medications that may contribute to weight gain, including trazodone and Effexor. 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 This nerve pain limits her physical activity, causing her to need frequent breaks when walking.  She mentions a family history of liver cancer, with her grandfather having died from the condition, likely related to his history of alcoholism.     Challenges affecting patient progress: strong hunger signals and/or impaired satiety / inhibitory control, low volume of physical activity at present , and orthopedic problems, medical conditions or chronic pain affecting mobility.   Orexigenic Control: Reports problems with appetite and hunger signals.  Reports problems with satiety and satiation.  Reports problems with eating patterns and portion control.  Denies abnormal cravings. Denies feeling deprived or restricted.   Pharmacotherapy for weight management: She is currently taking no anti-obesity medication.   Assessment and Plan   Treatment Plan For Obesity:  Recommended Dietary Goals  Martha Page is currently in the action stage of change. As such, her goal is to continue weight management plan. She has agreed to: continue current plan  Behavioral Health and Counseling  We discussed the following behavioral modification strategies today: continue to work on maintaining a reduced calorie state, getting the recommended amount of protein, incorporating whole foods, making healthy choices, staying well hydrated and practicing mindfulness when eating..  Additional education and resources provided today: Handout guide to GLP-1 therapy  Recommended Physical Activity Goals  Martha Page has been advised to work up to 150 minutes of moderate intensity aerobic activity a week and strengthening exercises 2-3 times per week for cardiovascular  health, weight loss maintenance and preservation of muscle mass.   She has agreed to :  Think about enjoyable ways to increase daily physical activity and overcoming barriers to exercise and Increase physical activity in their day and reduce sedentary time (increase NEAT).  Pharmacotherapy  We discussed various medication options to help Martha Page with her weight loss efforts and we both agreed to : In addition to reduced calorie nutrition plan (RCNP), behavioral strategies and physical activity, Martha Page would benefit from pharmacotherapy to assist with hunger signals, satiety and cravings. This will reduce obesity-related health risks by inducing weight loss, and help reduce food consumption and adherence to [facility]) . It may also improve QOL by improving self-confidence and reduce the  setbacks associated with metabolic adaptations.  She also has several high risk obesity related medical conditions including severe OSA, prediabetes, MASLD, hypercholesterolemia and therefore benefits from GLP-1 therapy.  After discussion of treatment options, mechanisms of action, benefits, side effects, contraindications and shared decision making she is agreeable to starting Zepbound 2.5 mg once a week. Patient also made aware that medication is indicated for long-term management of obesity and the risk of weight regain following discontinuation of treatment and hence the importance of adhering to medical weight loss plan.  We demonstrated use of device and patient using teach back method was able to demonstrate proper technique.  Associated Conditions Impacted by Obesity Treatment  Class 2 severe obesity due to excess calories with serious comorbidity and body mass index (BMI) of 36.0 to 36.9 in adult (HCC) -     Zepbound; Inject 2.5 mg into the skin once a week.  Dispense: 2 mL; Refill: 0  OSA (obstructive sleep apnea) Assessment & Plan: I reviewed sleep study from 2020 she has severe OSA particularly in the supine  position with an AHI of 56.  She is currently on PAP therapy.  Losing 15% of body weight may reduce AHI and improve outcomes.  She would also benefit from GLP-1 therapy.  After discussion of benefits and side effects she will be started on Zepbound 2.5 mg once a week.  Orders: -     Zepbound; Inject 2.5 mg into the skin once a week.  Dispense: 2 mL; Refill: 0  Metabolic dysfunction-associated steatotic liver disease (MASLD) Assessment & Plan: I reviewed previous labs she has had multiple elevations in the past.  She denies risk factor for viral hepatitis and drinks alcohol infrequently.  She had some family members that were alcoholics and one of them may have had liver disease.  She had negative hepatitis serologies and autoimmune workup but not for autoimmune liver disease.  Abdominal imaging in the past showed hepatic steatosis.  She also had a hepatic cyst.    Follow-up liver enzymes showed a decrease in alkaline phosphatase mild elevation of ALT at 35 and elevated GGT confirming hepatic source of liver enzyme elevation.  Her iron studies did not suggest iron overload.  Her hepatitis serologies were reactive for her hepatitis B surface antibody negative for surface antigen and core antibody.  She is nonreactive to hepatitis C.  Liver ultrasound showed a 1.0 cm echogenic mass in the right hepatic lobe likely a hepatic hemangioma.  A follow-up was recommended in 6 months.    We discussed the importance of maintaining a diet low in saturated fats and simple and added  sugars.  Losing 10 to 15% of body weight may improve condition.  She is also a good candidate for GLP-1 therapy.   Prediabetes Assessment & Plan: Most recent A1c is  Lab Results  Component Value Date   HGBA1C 5.7 (H) 10/25/2023   HGBA1C 5.3 10/16/2015    Patient aware of disease state and risk of progression. This may contribute to abnormal cravings, fatigue and diabetic complications without having diabetes.   We have  discussed treatment options which include: losing 7 to 10% of body weight, increasing physical activity to a goal of 150 minutes a week at moderate intensity.  Advised to maintain a diet low on simple and processed carbohydrates.  She will be started on Zepbound 2.5 mg once a week for pharmacoprophylaxis.    Hepatic hemangioma Assessment & Plan: 1 cm detected on ultrasound of the liver January 2025.  She will be due for follow-up surveillance in July 2025      Objective   Physical Exam:  Blood pressure 115/76, pulse 77, temperature 98.3 F (36.8 C), height 5\' 6"  (1.676 m), weight 218 lb (98.9 kg), SpO2 97%. Body mass index is 35.19 kg/m.  General: She is overweight, cooperative, alert, well developed, and in no acute distress. PSYCH: Has normal mood, affect and thought process.   HEENT: EOMI, sclerae are anicteric. Lungs: Normal breathing effort, no conversational dyspnea. Extremities: No edema.  Neurologic: No gross sensory or motor deficits. No tremors or fasciculations noted.    Diagnostic Data Reviewed:  BMET    Component Value Date/Time   NA 137 11/06/2023 1328   NA 137 05/26/2014 1855   K 4.4 11/06/2023 1328   K 3.3 (L) 05/26/2014 1855   CL 103 11/06/2023 1328   CL 105 05/26/2014 1855   CO2 21 11/06/2023 1328   CO2 20 (L) 05/26/2014 1855   GLUCOSE 81 11/06/2023 1328   GLUCOSE 92 05/22/2023 1015   GLUCOSE 131 (H) 05/26/2014 1855   BUN 13 11/06/2023 1328   BUN 5 (L) 05/26/2014 1855   CREATININE 0.74 11/06/2023 1328   CREATININE 0.81 05/22/2023 1015   CALCIUM 9.2 11/06/2023 1328   CALCIUM 8.4 (L) 05/26/2014 1855   GFRNONAA >60 01/24/2018 1543   GFRNONAA >60 05/26/2014 1855   GFRAA >60 01/24/2018 1543   GFRAA >60 05/26/2014 1855   Lab Results  Component Value Date   HGBA1C 5.7 (H) 10/25/2023   HGBA1C 5.3 10/16/2015   Lab Results  Component Value Date   INSULIN 11.0 10/25/2023   Lab Results  Component Value Date   TSH 1.44 08/24/2022   CBC     Component Value Date/Time   WBC 8.6 05/22/2023 1015   RBC 4.65 05/22/2023 1015   HGB 13.3 05/22/2023 1015   HGB 13.5 12/02/2021 1557   HCT 40.5 05/22/2023 1015   HCT 39.5 12/02/2021 1557   PLT 121 (L) 05/22/2023 1015   PLT 227 12/02/2021 1557   MCV 87.1 05/22/2023 1015   MCV 83 12/02/2021 1557   MCV 87 05/26/2014 1855   MCH 28.6 05/22/2023 1015   MCHC 32.8 05/22/2023 1015   RDW 13.1 05/22/2023 1015   RDW 13.5 12/02/2021 1557   RDW 18.2 (H) 05/26/2014 1855   Iron Studies    Component Value Date/Time   IRON 63 11/06/2023 1328   TIBC 276 11/06/2023 1328   FERRITIN 14 (L) 11/06/2023 1328   IRONPCTSAT 23 11/06/2023 1328   Lipid Panel     Component Value Date/Time   CHOL 165  05/22/2023 1015   TRIG 114 05/22/2023 1015   HDL 50 05/22/2023 1015   CHOLHDL 3.3 05/22/2023 1015   VLDL 13.4 10/16/2015 0910   LDLCALC 94 05/22/2023 1015   Hepatic Function Panel     Component Value Date/Time   PROT 7.0 11/06/2023 1328   PROT 7.5 12/02/2013 1017   ALBUMIN 4.2 11/06/2023 1328   ALBUMIN 3.4 12/02/2013 1017   AST 29 11/06/2023 1328   AST 21 12/02/2013 1017   ALT 35 (H) 11/06/2023 1328   ALT 49 12/02/2013 1017   ALKPHOS 133 (H) 11/06/2023 1328   ALKPHOS 90 12/02/2013 1017   BILITOT 0.3 11/06/2023 1328   BILITOT 0.3 12/02/2013 1017   BILIDIR 0.1 01/27/2017 0844      Component Value Date/Time   TSH 1.44 08/24/2022 1123   Nutritional Lab Results  Component Value Date   VD25OH 19.0 (L) 10/25/2023   VD25OH 27 (L) 08/24/2022   VD25OH 19.16 (L) 01/27/2017    Follow-Up   Return in about 4 weeks (around 01/02/2024) for For Weight Mangement with Dr. Rikki Spearing.Marland Kitchen She was informed of the importance of frequent follow up visits to maximize her success with intensive lifestyle modifications for her multiple health conditions.  Attestation Statement   Reviewed by clinician on day of visit: allergies, medications, problem list, medical history, surgical history, family history,  social history, and previous encounter notes.   I have spent 40 minutes in the care of the patient today including: preparing to see patient (e.g. review and interpretation of tests, old notes ), obtaining and/or reviewing separately obtained history, performing a medically appropriate examination or evaluation, counseling and educating the patient, ordering medications, test or procedures, documenting clinical information in the electronic or other health care record, and independently interpreting results and communicating results to the patient, family, or caregiver   Worthy Rancher, MD

## 2023-12-05 NOTE — Assessment & Plan Note (Signed)
I reviewed sleep study from 2020 she has severe OSA particularly in the supine position with an AHI of 56.  She is currently on PAP therapy.  Losing 15% of body weight may reduce AHI and improve outcomes.  She would also benefit from GLP-1 therapy.  After discussion of benefits and side effects she will be started on Zepbound 2.5 mg once a week.

## 2023-12-05 NOTE — Assessment & Plan Note (Signed)
Most recent A1c is  Lab Results  Component Value Date   HGBA1C 5.7 (H) 10/25/2023   HGBA1C 5.3 10/16/2015    Patient aware of disease state and risk of progression. This may contribute to abnormal cravings, fatigue and diabetic complications without having diabetes.   We have discussed treatment options which include: losing 7 to 10% of body weight, increasing physical activity to a goal of 150 minutes a week at moderate intensity.  Advised to maintain a diet low on simple and processed carbohydrates.  She will be started on Zepbound 2.5 mg once a week for pharmacoprophylaxis.

## 2023-12-05 NOTE — Assessment & Plan Note (Signed)
I reviewed previous labs she has had multiple elevations in the past.  She denies risk factor for viral hepatitis and drinks alcohol infrequently.  She had some family members that were alcoholics and one of them may have had liver disease.  She had negative hepatitis serologies and autoimmune workup but not for autoimmune liver disease.  Abdominal imaging in the past showed hepatic steatosis.  She also had a hepatic cyst.    Follow-up liver enzymes showed a decrease in alkaline phosphatase mild elevation of ALT at 35 and elevated GGT confirming hepatic source of liver enzyme elevation.  Her iron studies did not suggest iron overload.  Her hepatitis serologies were reactive for her hepatitis B surface antibody negative for surface antigen and core antibody.  She is nonreactive to hepatitis C.  Liver ultrasound showed a 1.0 cm echogenic mass in the right hepatic lobe likely a hepatic hemangioma.  A follow-up was recommended in 6 months.    We discussed the importance of maintaining a diet low in saturated fats and simple and added sugars.  Losing 10 to 15% of body weight may improve condition.  She is also a good candidate for GLP-1 therapy.

## 2023-12-14 ENCOUNTER — Encounter: Payer: Self-pay | Admitting: Internal Medicine

## 2023-12-14 MED ORDER — FERROUS SULFATE 325 (65 FE) MG PO TABS: 325.0000 mg | ORAL_TABLET | Freq: Two times a day (BID) | ORAL | 0 refills | Status: DC

## 2023-12-14 NOTE — Progress Notes (Signed)
 Medication refill

## 2023-12-18 ENCOUNTER — Other Ambulatory Visit: Payer: Self-pay | Admitting: Urology

## 2023-12-18 ENCOUNTER — Encounter (INDEPENDENT_AMBULATORY_CARE_PROVIDER_SITE_OTHER): Payer: Self-pay | Admitting: Internal Medicine

## 2023-12-18 DIAGNOSIS — R102 Pelvic and perineal pain: Secondary | ICD-10-CM

## 2023-12-19 ENCOUNTER — Telehealth (INDEPENDENT_AMBULATORY_CARE_PROVIDER_SITE_OTHER): Payer: Self-pay

## 2023-12-19 NOTE — Telephone Encounter (Signed)
PA for Zepbound started.

## 2023-12-20 ENCOUNTER — Encounter (INDEPENDENT_AMBULATORY_CARE_PROVIDER_SITE_OTHER): Payer: Self-pay

## 2023-12-20 NOTE — Telephone Encounter (Signed)
PA for Zepbound denied, pt notified

## 2023-12-22 ENCOUNTER — Encounter: Payer: Self-pay | Admitting: Internal Medicine

## 2023-12-22 ENCOUNTER — Ambulatory Visit: Payer: 59 | Admitting: Internal Medicine

## 2023-12-22 VITALS — BP 110/70 | Ht 66.0 in | Wt 219.4 lb

## 2023-12-22 DIAGNOSIS — E66812 Obesity, class 2: Secondary | ICD-10-CM | POA: Diagnosis not present

## 2023-12-22 DIAGNOSIS — Z23 Encounter for immunization: Secondary | ICD-10-CM | POA: Diagnosis not present

## 2023-12-22 DIAGNOSIS — Z0001 Encounter for general adult medical examination with abnormal findings: Secondary | ICD-10-CM

## 2023-12-22 DIAGNOSIS — Z6835 Body mass index (BMI) 35.0-35.9, adult: Secondary | ICD-10-CM

## 2023-12-22 NOTE — Assessment & Plan Note (Signed)
Encourage diet and exercise for weight loss

## 2023-12-22 NOTE — Progress Notes (Signed)
Subjective:    Patient ID: Martha Page, female    DOB: April 18, 1981, 43 y.o.   MRN: [id number]  HPI  Patient presents to clinic today for her annual exam.  Flu: 07/2022 Tetanus: 08/2014 COVID: x 3 Pap smear: 07/2023 Mammogram: 11/2023 Vision screening: annually Dentist: biannually  Diet: She does eat meat. She consumes fruits and veggies. She does eat some fried foods. She drinks mostly coffee and water. Exercise: None  Review of Systems  Past Medical History:  Diagnosis Date   Acne    iPledge: [phone]   Anxiety    Bursitis of right hip    Chronic constipation    Cluster headaches    Depression    Dermatitis    eczema   Elevated liver enzymes    Frequent headaches    GERD (gastroesophageal reflux disease)    Hx of dysplastic nevus 04/02/2013   Left distal knee. Mild atypia, margins free   Insomnia    Left knee pain    Migraine    2x/6 mos   Obesity    OSA (obstructive sleep apnea)    Osteoarthritis    Overactive bladder    Pneumonia 06/30/2016   has finished antibiotics.  still has lingering nighttime cough   POTS (postural orthostatic tachycardia syndrome)    Recurrent oral ulcers    Shoulder pain    Sleep apnea    uses CPAP   Vitamin B 12 deficiency    Vitamin D deficiency     Current Outpatient Medications  Medication Sig Dispense Refill   cetirizine (ZYRTEC) 10 MG tablet TAKE 1 TABLET BY MOUTH EVERY DAY 90 tablet 0   cyanocobalamin (VITAMIN B12) 1000 MCG/ML injection INJECT INTO THE MUSCLE EVERY 30 DAYS 1 mL 4   doxycycline (MONODOX) 100 MG capsule Take 1 capsule (100 mg total) by mouth 2 (two) times daily. Take with food and drink 60 capsule 11   ferrous sulfate 325 (65 FE) MG tablet Take 1 tablet (325 mg total) by mouth 2 (two) times daily with a meal. 60 tablet 0   ferrous sulfate 325 (65 FE) MG tablet Take 1 tablet (325 mg total) by mouth daily with breakfast. 60 tablet 0   ibuprofen (ADVIL) 800 MG tablet Take 1 tablet (800 mg total) by  mouth every 8 (eight) hours as needed. 90 tablet 0   ketoconazole (NIZORAL) 2 % shampoo Apply 1 Application topically 3 (three) times a week. Wash scalp, chest, back 3 times weekly, let sit 5-10 minutes and rinse out 120 mL 11   LORazepam (ATIVAN) 0.5 MG tablet Take 0.25 mg by mouth 2 (two) times daily as needed.     MIRENA, 52 MG, 20 MCG/24HR IUD      pantoprazole (PROTONIX) 40 MG tablet TAKE 1 TABLET BY MOUTH TWICE A DAY 180 tablet 1   SUMAtriptan (IMITREX) 100 MG tablet Take 1 tablet (100 mg total) by mouth every 2 (two) hours as needed for migraine. May repeat in 2 hours if headache persists or recurs. 10 tablet 0   tirzepatide (ZEPBOUND) 2.5 MG/0.5ML Pen Inject 2.5 mg into the skin once a week. 2 mL 0   traZODone (DESYREL) 50 MG tablet Take 100 mg by mouth at bedtime as needed.     venlafaxine (EFFEXOR) 75 MG tablet Take 1 tablet every day by oral route.     Vitamin D, Ergocalciferol, (DRISDOL) 1.25 MG (50000 UNIT) CAPS capsule Take 1 capsule (50,000 Units total) by mouth every 7 (seven)  days. 16 capsule 0   No current facility-administered medications for this visit.    Allergies  Allergen Reactions   Cefazolin Rash and Shortness Of Breath   Codeine Sulfate Itching   Red Dye #40 (Allura Red) Hives   Sulfa Antibiotics Other (See Comments)    "Stomach pain"   Amoxicillin Rash   Keflex [Cephalexin] Swelling and Rash   Penicillin V Potassium Rash    Family History  Problem Relation Age of Onset   Stroke Mother    Depression Mother    Interstitial cystitis Mother    Fibromyalgia Mother    Diabetes Mother    Post-traumatic stress disorder Mother    Anxiety disorder Mother    Obesity Mother    Heart disease Father    Throat cancer Father    Hypertension Father    High Cholesterol Father    Depression Father    Alcoholism Father    Healthy Sister    Aortic aneurysm Brother 30   Arthritis Paternal Grandmother    Lung cancer Paternal Grandmother    Heart disease Paternal  Grandmother    Stroke Paternal Grandmother    Hypertension Paternal Grandmother    Breast cancer Neg Hx     Social History   Socioeconomic History   Marital status: Married    Spouse name: Tinnie Gens   Number of children: 6   Years of education: Not on file   Highest education level: Some college, no degree  Occupational History   Occupation: Admin Assist  Tobacco Use   Smoking status: Former    Current packs/day: 0.00    Types: Cigarettes    Quit date: 07/01/2011    Years since quitting: 12.4    Passive exposure: Past   Smokeless tobacco: Never  Vaping Use   Vaping status: Never Used  Substance and Sexual Activity   Alcohol use: No    Alcohol/week: 0.0 standard drinks of alcohol   Drug use: No   Sexual activity: Yes    Birth control/protection: I.U.D.  Other Topics Concern   Not on file  Social History Narrative   Married.   6 children.   Works as a Futures trader.      Social Drivers of Corporate investment banker Strain: Not on file  Food Insecurity: Not on file  Transportation Needs: Not on file  Physical Activity: Not on file  Stress: Not on file  Social Connections: Not on file  Intimate Partner Violence: Not on file     Constitutional: Patient reports intermittent headaches.  Denies fever, malaise, fatigue, or abrupt weight changes.  HEENT: Denies eye pain, eye redness, ear pain, ringing in the ears, wax buildup, runny nose, nasal congestion, bloody nose, or sore throat. Respiratory: Denies difficulty breathing, shortness of breath, cough or sputum production.   Cardiovascular: Denies chest pain, chest tightness, palpitations or swelling in the hands or feet.  Gastrointestinal: Pt reports intermittent constipation, chronic pelvic pain. Denies bloating,  diarrhea or blood in the stool.  GU: Denies urgency, frequency, pain with urination, burning sensation, blood in urine, odor or discharge. Musculoskeletal: Denies decrease in range of motion, difficulty with  gait, muscle pain or joint pain and swelling.  Skin: Denies redness, rashes, lesions or ulcercations.  Neurological: Patient reports insomnia.  Denies dizziness, difficulty with memory, difficulty with speech or problems with balance and coordination.  Psych: Patient has a history of anxiety and depression.  Denies SI/HI.  No other specific complaints in a complete review of systems (except  as listed in HPI above).     Objective:   Physical Exam BP 110/70 (BP Location: Left Arm, Patient Position: Sitting, Cuff Size: Large)   Ht 5\' 6"  (1.676 m)   Wt 219 lb 6.4 oz (99.5 kg)   BMI 35.41 kg/m    Wt Readings from Last 3 Encounters:  12/05/23 218 lb (98.9 kg)  11/06/23 218 lb (98.9 kg)  10/25/23 217 lb (98.4 kg)    General: Appears her stated age, obese, in NAD. Skin: Warm, dry and intact.  HEENT: Head: normal shape and size; Eyes: sclera white, no icterus, conjunctiva pink, PERRLA and EOMs intact;  Neck:  Neck supple, trachea midline. No masses, lumps or thyromegaly present.  Cardiovascular: Normal rate and rhythm. S1,S2 noted.  No murmur, rubs or gallops noted. No JVD or BLE edema.  Pulmonary/Chest: Normal effort and positive vesicular breath sounds. No respiratory distress. No wheezes, rales or ronchi noted.  Abdomen: Normal bowel sounds.  Musculoskeletal: Strength 5/5 BUE/BLE.  No difficulty with gait.  Neurological: Alert and oriented. Cranial nerves II-XII grossly intact. Coordination normal.  Psychiatric: Mood and affect normal. Behavior is normal. Judgment and thought content normal.   BMET    Component Value Date/Time   NA 137 11/06/2023 1328   NA 137 05/26/2014 1855   K 4.4 11/06/2023 1328   K 3.3 (L) 05/26/2014 1855   CL 103 11/06/2023 1328   CL 105 05/26/2014 1855   CO2 21 11/06/2023 1328   CO2 20 (L) 05/26/2014 1855   GLUCOSE 81 11/06/2023 1328   GLUCOSE 92 05/22/2023 1015   GLUCOSE 131 (H) 05/26/2014 1855   BUN 13 11/06/2023 1328   BUN 5 (L) 05/26/2014 1855    CREATININE 0.74 11/06/2023 1328   CREATININE 0.81 05/22/2023 1015   CALCIUM 9.2 11/06/2023 1328   CALCIUM 8.4 (L) 05/26/2014 1855   GFRNONAA >60 01/24/2018 1543   GFRNONAA >60 05/26/2014 1855   GFRAA >60 01/24/2018 1543   GFRAA >60 05/26/2014 1855    Lipid Panel     Component Value Date/Time   CHOL 165 05/22/2023 1015   TRIG 114 05/22/2023 1015   HDL 50 05/22/2023 1015   CHOLHDL 3.3 05/22/2023 1015   VLDL 13.4 10/16/2015 0910   LDLCALC 94 05/22/2023 1015    CBC    Component Value Date/Time   WBC 8.6 05/22/2023 1015   RBC 4.65 05/22/2023 1015   HGB 13.3 05/22/2023 1015   HGB 13.5 12/02/2021 1557   HCT 40.5 05/22/2023 1015   HCT 39.5 12/02/2021 1557   PLT 121 (L) 05/22/2023 1015   PLT 227 12/02/2021 1557   MCV 87.1 05/22/2023 1015   MCV 83 12/02/2021 1557   MCV 87 05/26/2014 1855   MCH 28.6 05/22/2023 1015   MCHC 32.8 05/22/2023 1015   RDW 13.1 05/22/2023 1015   RDW 13.5 12/02/2021 1557   RDW 18.2 (H) 05/26/2014 1855   LYMPHSABS 2.4 12/02/2021 1557   MONOABS 0.4 04/14/2015 1644   EOSABS 0.0 12/02/2021 1557   BASOSABS 0.0 12/02/2021 1557    Hgb A1C Lab Results  Component Value Date   HGBA1C 5.7 (H) 10/25/2023            Assessment & Plan:   Preventative Health Maintenance:  Flu shot today Tetanus UTD Encouraged her to get her COVID booster Pap smear UTD Mammogram UTD Encouraged her to a balanced diet and exercise regimen Advised her to see an eye doctor and is annually Recent labs reviewed  RTC in  6 months, follow-up chronic conditions Nicki Reaper, NP

## 2023-12-22 NOTE — Patient Instructions (Signed)

## 2023-12-26 ENCOUNTER — Ambulatory Visit
Admission: RE | Admit: 2023-12-26 | Discharge: 2023-12-26 | Disposition: A | Discharge status: Home / Self Care | Payer: 59 | Source: Ambulatory Visit | Attending: Urology | Admitting: Urology

## 2023-12-26 DIAGNOSIS — R102 Pelvic and perineal pain: Secondary | ICD-10-CM | POA: Diagnosis present

## 2023-12-28 ENCOUNTER — Other Ambulatory Visit: Payer: Self-pay | Admitting: Internal Medicine

## 2023-12-29 ENCOUNTER — Encounter: Payer: Self-pay | Admitting: Internal Medicine

## 2023-12-29 NOTE — Telephone Encounter (Signed)
 Requested Prescriptions  Pending Prescriptions Disp Refills   pantoprazole (PROTONIX) 40 MG tablet [Pharmacy Med Name: PANTOPRAZOLE SOD DR 40 MG TAB] 180 tablet 0    Sig: TAKE 1 TABLET BY MOUTH TWICE A DAY     Gastroenterology: Proton Pump Inhibitors Passed - 12/29/2023  9:23 AM      Passed - Valid encounter within last 12 months    Recent Outpatient Visits           7 months ago Pure hypercholesterolemia   [facility] [place], Salvadore Oxford, NP   1 year ago Encounter for general adult medical examination with abnormal findings   [facility] [place], Salvadore Oxford, NP   1 year ago Hot flashes   [facility] [place], Salvadore Oxford, NP   1 year ago Medication monitoring encounter   [facility] [place], Salvadore Oxford, NP   1 year ago Nail problem   [place] [facility] [place], Salvadore Oxford, NP       Future Appointments             In 2 months Florian Buff, Rochel Brome, MD [facility] Urogy[facility]cology at [facility], [facility]   In 5 months Baity, Salvadore Oxford, NP  [facility]   In 8 months Deirdre Evener, MD [facility]

## 2024-01-02 ENCOUNTER — Encounter (INDEPENDENT_AMBULATORY_CARE_PROVIDER_SITE_OTHER): Payer: Self-pay | Admitting: Internal Medicine

## 2024-01-02 ENCOUNTER — Ambulatory Visit (INDEPENDENT_AMBULATORY_CARE_PROVIDER_SITE_OTHER): Payer: 59 | Admitting: Internal Medicine

## 2024-01-02 VITALS — BP 110/73 | HR 82 | Temp 98.3°F | Ht 66.0 in | Wt 212.0 lb

## 2024-01-02 DIAGNOSIS — Z6835 Body mass index (BMI) 35.0-35.9, adult: Secondary | ICD-10-CM

## 2024-01-02 DIAGNOSIS — E66812 Obesity, class 2: Secondary | ICD-10-CM | POA: Diagnosis not present

## 2024-01-02 DIAGNOSIS — R638 Other symptoms and signs concerning food and fluid intake: Secondary | ICD-10-CM | POA: Insufficient documentation

## 2024-01-02 DIAGNOSIS — G4733 Obstructive sleep apnea (adult) (pediatric): Secondary | ICD-10-CM | POA: Diagnosis not present

## 2024-01-02 DIAGNOSIS — R7303 Prediabetes: Secondary | ICD-10-CM

## 2024-01-02 MED ORDER — METFORMIN HCL ER 500 MG PO TB24
500.0000 mg | ORAL_TABLET | Freq: Two times a day (BID) | ORAL | 0 refills | Status: DC
Start: 2024-01-02 — End: 2024-01-18

## 2024-01-02 NOTE — Progress Notes (Signed)
 Office: [phone]  /  Fax: [phone]  Weight Summary And Biometrics  Vitals Temp: 98.3 F (36.8 C) BP: 110/73 Pulse Rate: 82 SpO2: 95 %   Anthropometric Measurements Height: 5\' 6"  (1.676 m) Weight: 212 lb (96.2 kg) BMI (Calculated): 34.23 Weight at Last Visit: 218 lb Weight Lost Since Last Visit: 6 lb Weight Gained Since Last Visit: 0 lb Starting Weight: 217 lb Total Weight Loss (lbs): 5 lb (2.268 kg) Peak Weight: 226 lb   Body Composition  Body Fat %: 43.9 % Fat Mass (lbs): 93.4 lbs Muscle Mass (lbs): 113.2 lbs Total Body Water (lbs): 80 lbs Visceral Fat Rating : 11    No data recorded Today's Visit #: 4  Starting Date: 11/24/22   Subjective   Chief Complaint: Obesity  Discussed the use of AI scribe software for clinical note transcription with the patient, who gave verbal consent to proceed.  History of Present Illness   Martha Page is a 43 year old female with obesity, obstructive sleep apnea, prediabetes, and hypercholesterolemia who presents for medical weight management.  She is seeking medical weight management due to obesity, obstructive sleep apnea, prediabetes, and hypercholesterolemia. Her insurance denied coverage for Zepbound, a medication previously prescribed for her condition, and she is interested in discussing alternative medication management options.  She has a history of using various weight management medications. She was on Adipex (phentermine) for six to nine months, during which she lost 35-40 pounds but eventually hit a plateau. She also tried Qsymia (phentermine/topiramate) for one month but did not notice a significant difference, and her insurance did not cover it.   She experiences persistent hunger, particularly in the late afternoons, and attempts to manage it by choosing healthier options like apples. She follows a nutritional plan and was previously on Weight Watchers, which she combined with phentermine, leading to  a period of nine months without candy consumption. Martha Page is her 'thing' and she managed to avoid it except for sugar-free Lifesavers occasionally.  She has been working on her nutrition and reports a recent weight loss of six pounds. She attributes this to following her plan and maintaining a high protein intake. She also mentions having a hip injection for bursitis and plans to increase her physical activity. She is preparing for a trip to [facility] in April and aims to be more active by then.  She has a family history of diabetes, with her mother recently being tapered off diabetic medications and her sister having a high A1c. She is aware of her prediabetic status and her insulin levels were noted to be 11. She is currently on Effexor and has not been on metformin before.         Assessment and Plan   Treatment Plan For Obesity:  Recommended Dietary Goals  Martha Page is currently in the action stage of change. As such, her goal is to continue weight management plan. She has agreed to: continue current plan  Behavioral Health and Counseling  We discussed the following behavioral modification strategies today: continue to work on maintaining a reduced calorie state, getting the recommended amount of protein, incorporating whole foods, making healthy choices, staying well hydrated and practicing mindfulness when eating..  Additional education and resources provided today: None  Recommended Physical Activity Goals  Martha Page has been advised to work up to 150 minutes of moderate intensity aerobic activity a week and strengthening exercises 2-3 times per week for cardiovascular health, weight loss maintenance and preservation of muscle mass.  She has agreed to :  continue to gradually increase the amount and intensity of exercise routine  Pharmacotherapy  We discussed various medication options to help Martha Page with her weight loss efforts and we both agreed to :  Start metformin XR 500 mg  twice daily.  She had been prescribed Zepbound but her insurance denied coverage medication is excluded from her plan  Associated Conditions Impacted by Obesity Treatment  Prediabetes - Plan: metFORMIN (GLUCOPHAGE-XR) 500 MG 24 hr tablet  OSA (obstructive sleep apnea)  Class 2 severe obesity due to excess calories with serious comorbidity and body mass index (BMI) of 35.0 to 35.9 in adult [facility])  Abnormal food appetite   Assessment and Plan    Obesity BMI of 34. Previously on Adipex and Qsymia with some success but hit a plateau. Insurance denied Zepbound for severe obstructive sleep apnea. Discussed environmental control and reducing sugar intake. Metformin discussed for weight management, including benefits such as reducing sugar cravings, lowering insulin levels, preventing diabetes, aiding weight loss, improving gut bacteria, and reducing visceral fat. Plan to start metformin first to evaluate response before adding phentermine. - Start metformin 500 mg twice daily with breakfast and dinner. Begin with one tablet in the morning for one week, then increase to one tablet twice daily if tolerated. - Consider adding phentermine in the morning after evaluating the response to metformin. - Encourage high protein intake (30 grams per meal, three times a day) and increased fiber from vegetables. Engineer, maintenance (IT) insurance for an appeal regarding Zepbound coverage.  Abnormal food appetite She has increased orexigenic signaling, impaired satiety and inhibitory control. This is secondary to an abnormal energy regulation system and pathological neurohormonal pathways characteristic of excess adiposity.  In addition to nutritional and behavioral strategies she benefits from pharmacotherapy.    Prediabetes Hemoglobin A1c of 5.7 in December, insulin level of 11. Discussed benefits of metformin in managing prediabetes, including reducing sugar cravings, lowering insulin levels, preventing diabetes, aiding  weight loss, improving gut bacteria, and reducing visceral fat. - Start metformin 500 mg twice daily with breakfast and dinner. Begin with one tablet in the morning for one week, then increase to one tablet twice daily if tolerated.  Obstructive Sleep Apnea Severe obstructive sleep apnea, qualifying her for Zepbound. Insurance denied coverage, possibly due to lack of acceptance of new guidelines. Discussed the importance of appealing the decision. - Contact insurance for an appeal regarding Zepbound coverage.   General Health Maintenance Discussed the importance of a balanced diet, high in protein and fiber, and low in sugars and artificial sweeteners. Emphasized the role of environmental control in managing sugar cravings and overall health. - Encourage high protein intake (30 grams per meal, three times a day) and increased fiber from vegetables. - Advise avoiding artificial sweeteners and reducing sugar intake to manage cravings and improve gut health.  Follow-up - Follow up in one month to evaluate the response to metformin.          Objective   Physical Exam:  Blood pressure 110/73, pulse 82, temperature 98.3 F (36.8 C), height 5\' 6"  (1.676 m), weight 212 lb (96.2 kg), SpO2 95%. Body mass index is 34.22 kg/m.  General: She is overweight, cooperative, alert, well developed, and in no acute distress. PSYCH: Has normal mood, affect and thought process.   HEENT: EOMI, sclerae are anicteric. Lungs: Normal breathing effort, no conversational dyspnea. Extremities: No edema.  Neurologic: No gross sensory or motor deficits. No tremors or fasciculations noted.  Diagnostic Data Reviewed:  BMET    Component Value Date/Time   NA 137 11/06/2023 1328   NA 137 05/26/2014 1855   K 4.4 11/06/2023 1328   K 3.3 (L) 05/26/2014 1855   CL 103 11/06/2023 1328   CL 105 05/26/2014 1855   CO2 21 11/06/2023 1328   CO2 20 (L) 05/26/2014 1855   GLUCOSE 81 11/06/2023 1328   GLUCOSE 92  05/22/2023 1015   GLUCOSE 131 (H) 05/26/2014 1855   BUN 13 11/06/2023 1328   BUN 5 (L) 05/26/2014 1855   CREATININE 0.74 11/06/2023 1328   CREATININE 0.81 05/22/2023 1015   CALCIUM 9.2 11/06/2023 1328   CALCIUM 8.4 (L) 05/26/2014 1855   GFRNONAA >60 01/24/2018 1543   GFRNONAA >60 05/26/2014 1855   GFRAA >60 01/24/2018 1543   GFRAA >60 05/26/2014 1855   Lab Results  Component Value Date   HGBA1C 5.7 (H) 10/25/2023   HGBA1C 5.3 10/16/2015   Lab Results  Component Value Date   INSULIN 11.0 10/25/2023   Lab Results  Component Value Date   TSH 1.44 08/24/2022   CBC    Component Value Date/Time   WBC 8.6 05/22/2023 1015   RBC 4.65 05/22/2023 1015   HGB 13.3 05/22/2023 1015   HGB 13.5 12/02/2021 1557   HCT 40.5 05/22/2023 1015   HCT 39.5 12/02/2021 1557   PLT 121 (L) 05/22/2023 1015   PLT 227 12/02/2021 1557   MCV 87.1 05/22/2023 1015   MCV 83 12/02/2021 1557   MCV 87 05/26/2014 1855   MCH 28.6 05/22/2023 1015   MCHC 32.8 05/22/2023 1015   RDW 13.1 05/22/2023 1015   RDW 13.5 12/02/2021 1557   RDW 18.2 (H) 05/26/2014 1855   Iron Studies    Component Value Date/Time   IRON 63 11/06/2023 1328   TIBC 276 11/06/2023 1328   FERRITIN 14 (L) 11/06/2023 1328   IRONPCTSAT 23 11/06/2023 1328   Lipid Panel     Component Value Date/Time   CHOL 165 05/22/2023 1015   TRIG 114 05/22/2023 1015   HDL 50 05/22/2023 1015   CHOLHDL 3.3 05/22/2023 1015   VLDL 13.4 10/16/2015 0910   LDLCALC 94 05/22/2023 1015   Hepatic Function Panel     Component Value Date/Time   PROT 7.0 11/06/2023 1328   PROT 7.5 12/02/2013 1017   ALBUMIN 4.2 11/06/2023 1328   ALBUMIN 3.4 12/02/2013 1017   AST 29 11/06/2023 1328   AST 21 12/02/2013 1017   ALT 35 (H) 11/06/2023 1328   ALT 49 12/02/2013 1017   ALKPHOS 133 (H) 11/06/2023 1328   ALKPHOS 90 12/02/2013 1017   BILITOT 0.3 11/06/2023 1328   BILITOT 0.3 12/02/2013 1017   BILIDIR 0.1 01/27/2017 0844      Component Value Date/Time   TSH  1.44 08/24/2022 1123   Nutritional Lab Results  Component Value Date   VD25OH 19.0 (L) 10/25/2023   VD25OH 27 (L) 08/24/2022   VD25OH 19.16 (L) 01/27/2017    Follow-Up   Return in about 4 weeks (around 01/30/2024) for For Weight Mangement with Dr. Rikki Spearing.Marland Kitchen She was informed of the importance of frequent follow up visits to maximize her success with intensive lifestyle modifications for her multiple health conditions.  Attestation Statement   Reviewed by clinician on day of visit: allergies, medications, problem list, medical history, surgical history, family history, social history, and previous encounter notes.     Worthy Rancher, MD

## 2024-01-03 ENCOUNTER — Other Ambulatory Visit: Payer: Self-pay | Admitting: Internal Medicine

## 2024-01-04 NOTE — Telephone Encounter (Signed)
 Requested Prescriptions  Pending Prescriptions Disp Refills   ibuprofen (ADVIL) 800 MG tablet [Pharmacy Med Name: IBUPROFEN 800 MG TABLET] 90 tablet 0    Sig: TAKE 1 TABLET BY MOUTH EVERY 8 HOURS AS NEEDED     Analgesics:  NSAIDS Failed - 01/04/2024 11:55 AM      Failed - Manual Review: Labs are only required if the patient has taken medication for more than 8 weeks.      Failed - PLT in normal range and within 360 days    Platelets  Date Value Ref Range Status  05/22/2023 121 (L) 140 - 400 Thousand/uL Final  12/02/2021 227 150 - 450 x10E3/uL Final         Passed - Cr in normal range and within 360 days    Creat  Date Value Ref Range Status  05/22/2023 0.81 0.50 - 0.99 mg/dL Final   Creatinine, Ser  Date Value Ref Range Status  11/06/2023 0.74 0.57 - 1.00 mg/dL Final         Passed - HGB in normal range and within 360 days    Hemoglobin  Date Value Ref Range Status  05/22/2023 13.3 11.7 - 15.5 g/dL Final  29/56/2130 86.5 11.1 - 15.9 g/dL Final         Passed - HCT in normal range and within 360 days    HCT  Date Value Ref Range Status  05/22/2023 40.5 35.0 - 45.0 % Final   Hematocrit  Date Value Ref Range Status  12/02/2021 39.5 34.0 - 46.6 % Final         Passed - eGFR is 30 or above and within 360 days    EGFR (African American)  Date Value Ref Range Status  05/26/2014 >60  Final   GFR calc Af Amer  Date Value Ref Range Status  01/24/2018 >60 >60 mL/min Final    Comment:    (NOTE) The eGFR has been calculated using the CKD EPI equation. This calculation has not been validated in all clinical situations. eGFR's persistently <60 mL/min signify possible Chronic Kidney Disease.    EGFR (Non-African Amer.)  Date Value Ref Range Status  05/26/2014 >60  Final    Comment:    eGFR values <24mL/min/1.73 m2 may be an indication of chronic kidney disease (CKD). Calculated eGFR is useful in patients with stable renal function. The eGFR calculation will not be  reliable in acutely ill patients when serum creatinine is changing rapidly. It is not useful in  patients on dialysis. The eGFR calculation may not be applicable to patients at the low and high extremes of body sizes, pregnant women, and vegetarians.    GFR calc non Af Amer  Date Value Ref Range Status  01/24/2018 >60 >60 mL/min Final   GFR  Date Value Ref Range Status  08/12/2016 80.81 >60.00 mL/min Final   eGFR  Date Value Ref Range Status  11/06/2023 102 >59 mL/min/1.73 Final         Passed - Patient is not pregnant      Passed - Valid encounter within last 12 months    Recent Outpatient Visits           7 months ago Pure hypercholesterolemia   [facility] [place], Salvadore Oxford, NP   1 year ago Encounter for general adult medical examination with abnormal findings   [place] [facility] [place], Salvadore Oxford, NP   1 year ago Hot flashes   [facility] [place], Salvadore Oxford, NP   1 year ago Medication monitoring encounter   [place] [facility] [place], Salvadore Oxford, NP   1 year ago Nail problem   [place] [facility] [place], Salvadore Oxford, NP       Future Appointments             In 1 month Florian Buff, Rochel Brome, MD [facility] Urogynecology at [facility], [facility]   In 5 months Baity, Salvadore Oxford, NP [facility], [place]   In 8 months Deirdre Evener, MD [facility]

## 2024-01-18 ENCOUNTER — Other Ambulatory Visit (INDEPENDENT_AMBULATORY_CARE_PROVIDER_SITE_OTHER): Payer: Self-pay | Admitting: Internal Medicine

## 2024-01-18 ENCOUNTER — Other Ambulatory Visit: Payer: Self-pay | Admitting: Internal Medicine

## 2024-01-18 DIAGNOSIS — R7303 Prediabetes: Secondary | ICD-10-CM

## 2024-01-19 ENCOUNTER — Other Ambulatory Visit: Payer: Self-pay | Admitting: Internal Medicine

## 2024-01-19 NOTE — Telephone Encounter (Signed)
 Requested Prescriptions  Refused Prescriptions Disp Refills   ibuprofen (ADVIL) 800 MG tablet [Pharmacy Med Name: IBUPROFEN 800 MG TABLET] 90 tablet 0    Sig: TAKE 1 TABLET BY MOUTH EVERY 8 HOURS AS NEEDED     Analgesics:  NSAIDS Failed - 01/19/2024 11:45 AM      Failed - Manual Review: Labs are only required if the patient has taken medication for more than 8 weeks.      Failed - PLT in normal range and within 360 days    Platelets  Date Value Ref Range Status  05/22/2023 121 (L) 140 - 400 Thousand/uL Final  12/02/2021 227 150 - 450 x10E3/uL Final         Passed - Cr in normal range and within 360 days    Creat  Date Value Ref Range Status  05/22/2023 0.81 0.50 - 0.99 mg/dL Final   Creatinine, Ser  Date Value Ref Range Status  11/06/2023 0.74 0.57 - 1.00 mg/dL Final         Passed - HGB in normal range and within 360 days    Hemoglobin  Date Value Ref Range Status  05/22/2023 13.3 11.7 - 15.5 g/dL Final  82/95/6213 08.6 11.1 - 15.9 g/dL Final         Passed - HCT in normal range and within 360 days    HCT  Date Value Ref Range Status  05/22/2023 40.5 35.0 - 45.0 % Final   Hematocrit  Date Value Ref Range Status  12/02/2021 39.5 34.0 - 46.6 % Final         Passed - eGFR is 30 or above and within 360 days    EGFR (African American)  Date Value Ref Range Status  05/26/2014 >60  Final   GFR calc Af Amer  Date Value Ref Range Status  01/24/2018 >60 >60 mL/min Final    Comment:    (NOTE) The eGFR has been calculated using the CKD EPI equation. This calculation has not been validated in all clinical situations. eGFR's persistently <60 mL/min signify possible Chronic Kidney Disease.    EGFR (Non-African Amer.)  Date Value Ref Range Status  05/26/2014 >60  Final    Comment:    eGFR values <37mL/min/1.73 m2 may be an indication of chronic kidney disease (CKD). Calculated eGFR is useful in patients with stable renal function. The eGFR calculation will not be  reliable in acutely ill patients when serum creatinine is changing rapidly. It is not useful in  patients on dialysis. The eGFR calculation may not be applicable to patients at the low and high extremes of body sizes, pregnant women, and vegetarians.    GFR calc non Af Amer  Date Value Ref Range Status  01/24/2018 >60 >60 mL/min Final   GFR  Date Value Ref Range Status  08/12/2016 80.81 >60.00 mL/min Final   eGFR  Date Value Ref Range Status  11/06/2023 102 >59 mL/min/1.73 Final         Passed - Patient is not pregnant      Passed - Valid encounter within last 12 months    Recent Outpatient Visits           8 months ago Pure hypercholesterolemia   [place] [facility] [place], Salvadore Oxford, NP   1 year ago Encounter for general adult medical examination with abnormal findings    [facility] [place], Salvadore Oxford, NP   1 year ago Hot flashes   [facility] [place], Salvadore Oxford, NP   1 year ago Medication monitoring encounter   [place] [facility] [place], Salvadore Oxford, NP   1 year ago Nail problem   [place] [facility] [place], Salvadore Oxford, NP       Future Appointments             In 1 month Florian Buff, Rochel Brome, MD [facility] Health Urogy[facility]cology at [facility], [facility]   In 5 months Baity, Salvadore Oxford, NP [place] [facility], [place]   In 7 months Deirdre Evener, MD [facility] [place] [facility]

## 2024-01-22 NOTE — Telephone Encounter (Signed)
 Request too soon for refill, last refill 12/13/23 for 90 days.  Requested Prescriptions  Pending Prescriptions Disp Refills   pantoprazole (PROTONIX) 40 MG tablet [Pharmacy Med Name: PANTOPRAZOLE SOD DR 40 MG TAB] 180 tablet 2    Sig: TAKE 1 TABLET BY MOUTH TWICE A DAY     Gastroenterology: Proton Pump Inhibitors Passed - 01/22/2024  9:06 AM      Passed - Valid encounter within last 12 months    Recent Outpatient Visits           8 months ago Pure hypercholesterolemia   [facility] [place], Salvadore Oxford, NP   1 year ago Encounter for general adult medical examination with abnormal findings   [place] [facility] [place], Salvadore Oxford, NP   1 year ago Hot flashes   [facility] [place], Salvadore Oxford, NP   1 year ago Medication monitoring encounter   [facility] [place], Salvadore Oxford, NP   1 year ago Nail problem   [place] [place], Salvadore Oxford, NP       Future Appointments             In 1 month Florian Buff, Rochel Brome, MD [facility] at [facility], [facility]   In 5 months Baity, Salvadore Oxford, NP [place]   In 7 months Deirdre Evener, MD [facility] [place] [facility]

## 2024-02-01 ENCOUNTER — Encounter (INDEPENDENT_AMBULATORY_CARE_PROVIDER_SITE_OTHER): Payer: Self-pay | Admitting: Internal Medicine

## 2024-02-01 ENCOUNTER — Ambulatory Visit (INDEPENDENT_AMBULATORY_CARE_PROVIDER_SITE_OTHER): Payer: 59 | Admitting: Internal Medicine

## 2024-02-01 VITALS — BP 106/72 | HR 77 | Temp 98.0°F | Ht 66.0 in | Wt 209.0 lb

## 2024-02-01 DIAGNOSIS — Z6833 Body mass index (BMI) 33.0-33.9, adult: Secondary | ICD-10-CM

## 2024-02-01 DIAGNOSIS — R7303 Prediabetes: Secondary | ICD-10-CM | POA: Diagnosis not present

## 2024-02-01 DIAGNOSIS — G4733 Obstructive sleep apnea (adult) (pediatric): Secondary | ICD-10-CM

## 2024-02-01 DIAGNOSIS — R638 Other symptoms and signs concerning food and fluid intake: Secondary | ICD-10-CM | POA: Diagnosis not present

## 2024-02-01 DIAGNOSIS — E66812 Obesity, class 2: Secondary | ICD-10-CM

## 2024-02-01 DIAGNOSIS — E66811 Obesity, class 1: Secondary | ICD-10-CM

## 2024-02-01 MED ORDER — LOMAIRA 8 MG PO TABS: 8.0000 mg | ORAL_TABLET | Freq: Every day | ORAL | 0 refills | Status: DC

## 2024-02-01 MED ORDER — METFORMIN HCL ER 500 MG PO TB24: 500.0000 mg | ORAL_TABLET | Freq: Two times a day (BID) | ORAL | 0 refills | Status: DC

## 2024-02-01 NOTE — Assessment & Plan Note (Signed)
 She is actively engaged in a weight management program, having lost three pounds since the last visit. She follows a category two meal plan, tracks calories, consumes whole foods, maintains hydration, and exercises four to five days a week with strength training and cardio. She reports adequate sleep and some stress. Metformin XR 500 mg twice daily was initiated due to insurance denial for Zepbound. She previously used Adipex and Qsymia with some success but experienced a plateau. Metformin provides appetite suppression, though initially caused a metallic taste. She is considering adding phentermine for additional appetite suppression. Discussed phentermine as a controlled medication with potential side effects such as dry mouth, increased blood pressure, anxiety, and palpitations. Discussed the need for a 5% weight reduction over six months to continue the medication and the long-term nature of anti-obesity medications, including psychological and financial implications of intermittent use. - Continue metformin XR 500 mg twice daily with food. - Start Lomaira (phentermine) 8 mg in the morning on an empty stomach. - Monitor for side effects and report any adverse effects immediately. - Schedule follow-up appointment in three to four weeks.

## 2024-02-01 NOTE — Assessment & Plan Note (Signed)
 She has increased orexigenic signaling, impaired satiety and inhibitory control. This is secondary to an abnormal energy regulation system and pathological neurohormonal pathways characteristic of excess adiposity.  In addition to nutritional and behavioral strategies she benefits from pharmacotherapy.  After discussion of benefits and side effect she will be started on Lomaira 8 mg in the morning.  Please refer to obesity treatment plan

## 2024-02-01 NOTE — Assessment & Plan Note (Signed)
 Most recent A1c is  Lab Results  Component Value Date   HGBA1C 5.7 (H) 10/25/2023   HGBA1C 5.3 10/16/2015    Patient aware of disease state and risk of progression. This may contribute to abnormal cravings, fatigue and diabetic complications without having diabetes.   We have discussed treatment options which include: losing 7 to 10% of body weight, increasing physical activity to a goal of 150 minutes a week at moderate intensity.  Advised to maintain a diet low on simple and processed carbohydrates.  Her insurance denied Zepbound for pharmacoprophylaxis.  She has now metformin XR 500 mg twice a day without any adverse effects.  She will continue current management

## 2024-02-01 NOTE — Progress Notes (Signed)
 Office: [phone]  /  Fax: [phone]  Weight Summary And Biometrics  Vitals Temp: 98 F (36.7 C) BP: 106/72 Pulse Rate: 77 SpO2: 97 %   Anthropometric Measurements Height: 5\' 6"  (1.676 m) Weight: 209 lb (94.8 kg) BMI (Calculated): 33.75 Weight at Last Visit: 212 lb Weight Lost Since Last Visit: 3 lb Weight Gained Since Last Visit: 0 lb Starting Weight: 217 lb Total Weight Loss (lbs): 8 lb (3.629 kg) Peak Weight: 226 lb   Body Composition  Body Fat %: 42.9 % Fat Mass (lbs): 90 lbs Muscle Mass (lbs): 113.6 lbs Total Body Water (lbs): 78.2 lbs Visceral Fat Rating : 10    No data recorded Today's Visit #: 5  Starting Date: 11/24/22   Subjective   Chief Complaint: Obesity  Interval History Discussed the use of AI scribe software for clinical note transcription with the patient, who gave verbal consent to proceed.  History of Present Illness Martha Page is a 43 year old female who presents for medical weight management.  She has lost three pounds since her last visit and adheres to a category two meal plan approximately ninety percent of the time. She tracks her calories, consumes more whole foods, meets the recommended protein intake, and maintains adequate hydration. She is not skipping meals.  She was started on metformin XR 500 mg twice a day after her insurance denied coverage for Zepbound. She has previously been on Adipex and Qsymia with some success but experienced a plateau. Metformin initially caused a 'weird' taste in her mouth, which has since improved. Her portion sizes have decreased, and she experiences a sense of fullness. Cravings for sweets have lessened, and she often opts for healthier snacks like apples or protein bars.  She exercises four to five days a week for about twenty-five to forty-five minutes, engaging in a combination of strength training and cardio. She reports getting adequate sleep but experiences some stress.  She has  a history of strong orexigenic signaling and benefits from anti-obesity medications. She has been on phentermine in the past, which helped with weight loss, but weight returned after discontinuation. She is currently on metformin and considering additional medication options for appetite suppression.    Challenges affecting patient progress: none.    Pharmacotherapy for weight management: She is currently taking Metformin (off label use for incretin effect and / or insulin resistance and / or diabetes prevention) with adequate clinical response  and without side effects..   Assessment and Plan   Treatment Plan For Obesity:  Recommended Dietary Goals  Martha Page is currently in the action stage of change. As such, her goal is to continue weight management plan. She has agreed to: continue current plan  Behavioral Health and Counseling  We discussed the following behavioral modification strategies today: continue to work on maintaining a reduced calorie state, getting the recommended amount of protein, incorporating whole foods, making healthy choices, staying well hydrated and practicing mindfulness when eating..  Additional education and resources provided today: None  Recommended Physical Activity Goals  Martha Page has been advised to work up to 150 minutes of moderate intensity aerobic activity a week and strengthening exercises 2-3 times per week for cardiovascular health, weight loss maintenance and preservation of muscle mass.   She has agreed to :  Think about enjoyable ways to increase daily physical activity and overcoming barriers to exercise and Increase physical activity in their day and reduce sedentary time (increase NEAT).  Pharmacotherapy  We discussed various medication options  to help Martha Page with her weight loss efforts and we both agreed to : start anti-obesity medication.  In addition to reduced calorie nutrition plan (RCNP), behavioral strategies and physical activity,  Martha Page would benefit from pharmacotherapy to assist with hunger signals, satiety and cravings. This will reduce obesity-related health risks by inducing weight loss, and help reduce food consumption and adherence to [facility]) . It may also improve QOL by improving self-confidence and reduce the  setbacks associated with metabolic adaptations.  After discussion of treatment options, mechanisms of action, benefits, side effects, contraindications and shared decision making she is agreeable to starting Lomaira 8 mg once daily. Patient also made aware that medication is indicated for long-term management of obesity and the risk of weight regain following discontinuation of treatment and hence the importance of adhering to medical weight loss plan.   Controlled substance agreement was reviewed and signed during today's visit We reviewed state registry last use of sympathomimetics was in 2023 which included phentermine and a short course of Qsymia We reviewed that if she does not lose 5% of body weight in 6 months medication will be discontinued. Associated Conditions Impacted by Obesity Treatment  Class 2 severe obesity due to excess calories with serious comorbidity and body mass index (BMI) of 36.0 to 36.9 in adult [facility])  Prediabetes Assessment & Plan: Most recent A1c is  Lab Results  Component Value Date   HGBA1C 5.7 (H) 10/25/2023   HGBA1C 5.3 10/16/2015    Patient aware of disease state and risk of progression. This may contribute to abnormal cravings, fatigue and diabetic complications without having diabetes.   We have discussed treatment options which include: losing 7 to 10% of body weight, increasing physical activity to a goal of 150 minutes a week at moderate intensity.  Advised to maintain a diet low on simple and processed carbohydrates.  Her insurance denied Zepbound for pharmacoprophylaxis.  She has now metformin XR 500 mg twice a day without any adverse effects.  She will continue  current management   Orders: -     metFORMIN HCl ER; Take 1 tablet (500 mg total) by mouth 2 (two) times daily with a meal.  Dispense: 60 tablet; Refill: 0  Abnormal food appetite Assessment & Plan: She has increased orexigenic signaling, impaired satiety and inhibitory control. This is secondary to an abnormal energy regulation system and pathological neurohormonal pathways characteristic of excess adiposity.  In addition to nutritional and behavioral strategies she benefits from pharmacotherapy.  After discussion of benefits and side effect she will be started on Lomaira 8 mg in the morning.  Please refer to obesity treatment plan    Class 1 obesity with serious comorbidity and body mass index (BMI) of 33.0 to 33.9 in adult, unspecified obesity type Assessment & Plan: She is actively engaged in a weight management program, having lost three pounds since the last visit. She follows a category two meal plan, tracks calories, consumes whole foods, maintains hydration, and exercises four to five days a week with strength training and cardio. She reports adequate sleep and some stress. Metformin XR 500 mg twice daily was initiated due to insurance denial for Zepbound. She previously used Adipex and Qsymia with some success but experienced a plateau. Metformin provides appetite suppression, though initially caused a metallic taste. She is considering adding phentermine for additional appetite suppression. Discussed phentermine as a controlled medication with potential side effects such as dry mouth, increased blood pressure, anxiety, and palpitations. Discussed the need for a 5%  weight reduction over six months to continue the medication and the long-term nature of anti-obesity medications, including psychological and financial implications of intermittent use. - Continue metformin XR 500 mg twice daily with food. - Start Lomaira (phentermine) 8 mg in the morning on an empty stomach. - Monitor for side  effects and report any adverse effects immediately. - Schedule follow-up appointment in three to four weeks.  Orders: Lasandra Beech; Take 1 tablet (8 mg total) by mouth daily.  Dispense: 28 tablet; Refill: 0  OSA (obstructive sleep apnea) Assessment & Plan: I reviewed sleep study from 2020 she has severe OSA particularly in the supine position with an AHI of 56.  She is currently on PAP therapy.  Losing 15% of body weight may reduce AHI and improve outcomes.  She would also benefit from GLP-1 therapy but this was denied by her insurance      Assessment & Plan  General Health Maintenance She is actively engaged in lifestyle modifications, including diet and exercise, to manage weight and improve overall health. She is aware of the importance of maintaining muscle mass and preventing muscle loss. - Encourage continued adherence to diet and exercise regimen. - Discuss the importance of maintaining muscle mass and preventing muscle loss through adequate protein intake and regular exercise.      Objective   Physical Exam:  Blood pressure 106/72, pulse 77, temperature 98 F (36.7 C), height 5\' 6"  (1.676 m), weight 209 lb (94.8 kg), SpO2 97%. Body mass index is 33.73 kg/m.  General: She is overweight, cooperative, alert, well developed, and in no acute distress. PSYCH: Has normal mood, affect and thought process.   HEENT: EOMI, sclerae are anicteric. Lungs: Normal breathing effort, no conversational dyspnea. Extremities: No edema.  Neurologic: No gross sensory or motor deficits. No tremors or fasciculations noted.    Diagnostic Data Reviewed:  BMET    Component Value Date/Time   NA 137 11/06/2023 1328   NA 137 05/26/2014 1855   K 4.4 11/06/2023 1328   K 3.3 (L) 05/26/2014 1855   CL 103 11/06/2023 1328   CL 105 05/26/2014 1855   CO2 21 11/06/2023 1328   CO2 20 (L) 05/26/2014 1855   GLUCOSE 81 11/06/2023 1328   GLUCOSE 92 05/22/2023 1015   GLUCOSE 131 (H) 05/26/2014 1855    BUN 13 11/06/2023 1328   BUN 5 (L) 05/26/2014 1855   CREATININE 0.74 11/06/2023 1328   CREATININE 0.81 05/22/2023 1015   CALCIUM 9.2 11/06/2023 1328   CALCIUM 8.4 (L) 05/26/2014 1855   GFRNONAA >60 01/24/2018 1543   GFRNONAA >60 05/26/2014 1855   GFRAA >60 01/24/2018 1543   GFRAA >60 05/26/2014 1855   Lab Results  Component Value Date   HGBA1C 5.7 (H) 10/25/2023   HGBA1C 5.3 10/16/2015   Lab Results  Component Value Date   INSULIN 11.0 10/25/2023   Lab Results  Component Value Date   TSH 1.44 08/24/2022   CBC    Component Value Date/Time   WBC 8.6 05/22/2023 1015   RBC 4.65 05/22/2023 1015   HGB 13.3 05/22/2023 1015   HGB 13.5 12/02/2021 1557   HCT 40.5 05/22/2023 1015   HCT 39.5 12/02/2021 1557   PLT 121 (L) 05/22/2023 1015   PLT 227 12/02/2021 1557   MCV 87.1 05/22/2023 1015   MCV 83 12/02/2021 1557   MCV 87 05/26/2014 1855   MCH 28.6 05/22/2023 1015   MCHC 32.8 05/22/2023 1015   RDW 13.1 05/22/2023  1015   RDW 13.5 12/02/2021 1557   RDW 18.2 (H) 05/26/2014 1855   Iron Studies    Component Value Date/Time   IRON 63 11/06/2023 1328   TIBC 276 11/06/2023 1328   FERRITIN 14 (L) 11/06/2023 1328   IRONPCTSAT 23 11/06/2023 1328   Lipid Panel     Component Value Date/Time   CHOL 165 05/22/2023 1015   TRIG 114 05/22/2023 1015   HDL 50 05/22/2023 1015   CHOLHDL 3.3 05/22/2023 1015   VLDL 13.4 10/16/2015 0910   LDLCALC 94 05/22/2023 1015   Hepatic Function Panel     Component Value Date/Time   PROT 7.0 11/06/2023 1328   PROT 7.5 12/02/2013 1017   ALBUMIN 4.2 11/06/2023 1328   ALBUMIN 3.4 12/02/2013 1017   AST 29 11/06/2023 1328   AST 21 12/02/2013 1017   ALT 35 (H) 11/06/2023 1328   ALT 49 12/02/2013 1017   ALKPHOS 133 (H) 11/06/2023 1328   ALKPHOS 90 12/02/2013 1017   BILITOT 0.3 11/06/2023 1328   BILITOT 0.3 12/02/2013 1017   BILIDIR 0.1 01/27/2017 0844      Component Value Date/Time   TSH 1.44 08/24/2022 1123   Nutritional Lab Results   Component Value Date   VD25OH 19.0 (L) 10/25/2023   VD25OH 27 (L) 08/24/2022   VD25OH 19.16 (L) 01/27/2017    Medications: Outpatient Encounter Medications as of 02/01/2024  Medication Sig   cetirizine (ZYRTEC) 10 MG tablet TAKE 1 TABLET BY MOUTH EVERY DAY   cyanocobalamin (VITAMIN B12) 1000 MCG/ML injection INJECT INTO THE MUSCLE EVERY 30 DAYS   ibuprofen (ADVIL) 800 MG tablet TAKE 1 TABLET BY MOUTH EVERY 8 HOURS AS NEEDED   ketoconazole (NIZORAL) 2 % shampoo Apply 1 Application topically 3 (three) times a week. Wash scalp, chest, back 3 times weekly, let sit 5-10 minutes and rinse out   LORazepam (ATIVAN) 0.5 MG tablet Take 0.25 mg by mouth 2 (two) times daily as needed.   MIRENA, 52 MG, 20 MCG/24HR IUD    pantoprazole (PROTONIX) 40 MG tablet TAKE 1 TABLET BY MOUTH TWICE A DAY   Phentermine HCl (LOMAIRA) 8 MG TABS Take 1 tablet (8 mg total) by mouth daily.   SUMAtriptan (IMITREX) 100 MG tablet Take 1 tablet (100 mg total) by mouth every 2 (two) hours as needed for migraine. May repeat in 2 hours if headache persists or recurs.   traZODone (DESYREL) 50 MG tablet Take 100 mg by mouth at bedtime as needed.   venlafaxine (EFFEXOR) 75 MG tablet Take 1 tablet every day by oral route.   Vitamin D, Ergocalciferol, (DRISDOL) 1.25 MG (50000 UNIT) CAPS capsule Take 1 capsule (50,000 Units total) by mouth every 7 (seven) days.   [DISCONTINUED] metFORMIN (GLUCOPHAGE-XR) 500 MG 24 hr tablet TAKE 1 TABLET BY MOUTH 2 TIMES DAILY WITH A MEAL.   metFORMIN (GLUCOPHAGE-XR) 500 MG 24 hr tablet Take 1 tablet (500 mg total) by mouth 2 (two) times daily with a meal.   [DISCONTINUED] doxycycline (MONODOX) 100 MG capsule Take 1 capsule (100 mg total) by mouth 2 (two) times daily. Take with food and drink   No facility-administered encounter medications on file as of 02/01/2024.     Follow-Up   Return in about 3 weeks (around 02/22/2024).Marland Kitchen She was informed of the importance of frequent follow up visits to  maximize her success with intensive lifestyle modifications for her multiple health conditions.  Attestation Statement   Reviewed by clinician on day of visit: allergies,  medications, problem list, medical history, surgical history, family history, social history, and previous encounter notes.     Worthy Rancher, MD

## 2024-02-01 NOTE — Assessment & Plan Note (Signed)
 I reviewed sleep study from 2020 she has severe OSA particularly in the supine position with an AHI of 56.  She is currently on PAP therapy.  Losing 15% of body weight may reduce AHI and improve outcomes.  She would also benefit from GLP-1 therapy but this was denied by her insurance

## 2024-02-06 ENCOUNTER — Encounter: Payer: Self-pay | Admitting: Internal Medicine

## 2024-02-06 MED ORDER — VENLAFAXINE HCL ER 150 MG PO CP24: 150.0000 mg | ORAL_CAPSULE | Freq: Every day | ORAL | 1 refills | Status: DC

## 2024-02-06 NOTE — Addendum Note (Signed)
 Addended by: Lorre Munroe on: 02/06/2024 09:53 AM   Modules accepted: Orders

## 2024-02-08 ENCOUNTER — Other Ambulatory Visit: Payer: Self-pay | Admitting: Internal Medicine

## 2024-02-08 ENCOUNTER — Other Ambulatory Visit: Payer: Self-pay

## 2024-02-08 ENCOUNTER — Telehealth (INDEPENDENT_AMBULATORY_CARE_PROVIDER_SITE_OTHER): Admitting: Internal Medicine

## 2024-02-08 ENCOUNTER — Encounter: Payer: Self-pay | Admitting: Internal Medicine

## 2024-02-08 DIAGNOSIS — R21 Rash and other nonspecific skin eruption: Secondary | ICD-10-CM

## 2024-02-08 DIAGNOSIS — L989 Disorder of the skin and subcutaneous tissue, unspecified: Secondary | ICD-10-CM

## 2024-02-08 MED ORDER — MUPIROCIN 2 % EX OINT: TOPICAL_OINTMENT | Freq: Two times a day (BID) | CUTANEOUS | 0 refills | Status: DC

## 2024-02-08 MED ORDER — MUPIROCIN CALCIUM 2 % EX CREA: 1.0000 | TOPICAL_CREAM | Freq: Two times a day (BID) | CUTANEOUS | 0 refills | Status: DC

## 2024-02-08 NOTE — Patient Instructions (Signed)
Folliculitis  Folliculitis occurs when hair follicles become inflamed. A hair follicle is a tiny opening in your skin where your hair grows from. This condition often occurs on the scalp, thighs, legs, back, and buttocks but can happen anywhere on the body. What are the causes? A common cause of this condition is an infection from bacteria. The type of folliculitis caused by bacteria can last a long time or go away and come back. The bacteria can live anywhere on your skin. They are often found in the nostrils. Other causes may include: An infection from a fungus. An infection from a virus. Your skin touching some chemicals, such as oils and tars. Shaving or waxing. Greasy ointments or creams put on the skin. What increases the risk? You are more likely to develop this condition if: Your body has a weak disease-fighting system (immune system). You have diabetes. You are obese. What are the signs or symptoms? Symptoms of this condition include: Redness. Soreness. Swelling. Itching. Small white or yellow, itchy spots filled with pus (pustules) that appear over a red area. If the infection goes deep into the follicle, these may turn into a boil (furuncle). A group of boils (carbuncle). These tend to form in hairy, sweaty areas of the body. How is this diagnosed? This condition is diagnosed with a skin exam. Your health care provider may take a sample of one of the pustules or boils to test in a lab. How is this treated? This condition may be treated by: Putting a warm, wet cloth (warm compress) on the affected areas. Taking antibiotics or applying them to the skin. Applying or bathing with a solution that kills germs (antiseptic). Taking an over-the-counter medicine. This can help with itching. Having a procedure to drain pustules or boils. This may be done if a pustule or boil contains a lot of pus or fluid. Having laser hair removal. This may be done when the condition lasts for a  long time. Follow these instructions at home: Managing pain and swelling  If directed, apply heat to the affected area as often as told by your health care provider. Use the heat source that your health care provider recommends, such as a moist heat pack or a heating pad. Place a towel between your skin and the heat source. Leave the heat on for 20-30 minutes. If your skin turns bright red, remove the heat right away to prevent burns. The risk of burns is higher if you cannot feel pain, heat, or cold. General instructions Take over-the-counter and prescription medicines only as told by your health care provider. If you were prescribed antibiotics, take or apply them as told by your health care provider. Do not stop using the antibiotic even if you start to feel better. Check your irritated area every day for signs of infection. Check for: More redness, swelling, or pain. Fluid or blood. Warmth. Pus or a bad smell. Do not shave irritated skin. Keep all follow-up visits. Your health care provider will check if the treatments are helping. Contact a health care provider if: You have a fever. You have any signs of infection. Red streaks are spreading from the affected area. This information is not intended to replace advice given to you by your health care provider. Make sure you discuss any questions you have with your health care provider. Document Revised: 03/29/2022 Document Reviewed: 03/29/2022 Elsevier Patient Education  2024 [employer].

## 2024-02-08 NOTE — Progress Notes (Signed)
 Virtual Visit via Video Note  I connected with Martha Page on 02/08/24 at  1:00 PM EDT by a video enabled telemedicine application and verified that I am speaking with the correct person using two identifiers.  Location: Patient: Work Provider: Engineer, structural in this video call: Martha Reaper, NP-C and Martha Page   I discussed the limitations of evaluation and management by telemedicine and the availability of in person appointments. The patient expressed understanding and agreed to proceed.  History of Present Illness:   Discussed the use of AI scribe software for clinical note transcription with the patient, who gave verbal consent to proceed.   Martha Page is a 43 year old female who presents with recurrent painful skin bumps after exercising.  Over the past month, she has been experiencing recurrent skin bumps that appear after exercising. These bumps are described as 'little pimples' and are painful, with some having drained.  The bumps are located on her legs, arms, wrists, and bra line, with a particularly painful one on her elbow that is exacerbated by resting on surfaces. No bumps are present on her face.  She has been using [place] soap but has not applied any other treatments to the bumps. She is currently taking Zyrtec daily.      Past Medical History:  Diagnosis Date   Acne    iPledge: [phone]   Anxiety    Bursitis of right hip    Chronic constipation    Cluster headaches    Depression    Dermatitis    eczema   Elevated liver enzymes    Frequent headaches    GERD (gastroesophageal reflux disease)    Hx of dysplastic nevus 04/02/2013   Left distal knee. Mild atypia, margins free   Insomnia    Left knee pain    Migraine    2x/6 mos   Obesity    OSA (obstructive sleep apnea)    Osteoarthritis    Overactive bladder    Pneumonia 06/30/2016   has finished antibiotics.  still has lingering nighttime cough   POTS (postural  orthostatic tachycardia syndrome)    Recurrent oral ulcers    Shoulder pain    Sleep apnea    uses CPAP   Vitamin B 12 deficiency    Vitamin D deficiency     Current Outpatient Medications  Medication Sig Dispense Refill   cetirizine (ZYRTEC) 10 MG tablet TAKE 1 TABLET BY MOUTH EVERY DAY 90 tablet 0   cyanocobalamin (VITAMIN B12) 1000 MCG/ML injection INJECT INTO THE MUSCLE EVERY 30 DAYS 1 mL 4   ibuprofen (ADVIL) 800 MG tablet TAKE 1 TABLET BY MOUTH EVERY 8 HOURS AS NEEDED 90 tablet 0   ketoconazole (NIZORAL) 2 % shampoo Apply 1 Application topically 3 (three) times a week. Wash scalp, chest, back 3 times weekly, let sit 5-10 minutes and rinse out 120 mL 11   LORazepam (ATIVAN) 0.5 MG tablet Take 0.25 mg by mouth 2 (two) times daily as needed.     metFORMIN (GLUCOPHAGE-XR) 500 MG 24 hr tablet Take 1 tablet (500 mg total) by mouth 2 (two) times daily with a meal. 60 tablet 0   MIRENA, 52 MG, 20 MCG/24HR IUD      pantoprazole (PROTONIX) 40 MG tablet TAKE 1 TABLET BY MOUTH TWICE A DAY 180 tablet 0   Phentermine HCl (LOMAIRA) 8 MG TABS Take 1 tablet (8 mg total) by mouth daily. 28 tablet 0   SUMAtriptan (IMITREX) 100  MG tablet Take 1 tablet (100 mg total) by mouth every 2 (two) hours as needed for migraine. May repeat in 2 hours if headache persists or recurs. 10 tablet 0   traZODone (DESYREL) 50 MG tablet Take 100 mg by mouth at bedtime as needed.     venlafaxine XR (EFFEXOR-XR) 150 MG 24 hr capsule Take 1 capsule (150 mg total) by mouth daily with breakfast. 90 capsule 1   Vitamin D, Ergocalciferol, (DRISDOL) 1.25 MG (50000 UNIT) CAPS capsule Take 1 capsule (50,000 Units total) by mouth every 7 (seven) days. 16 capsule 0   No current facility-administered medications for this visit.    Allergies  Allergen Reactions   Cefazolin Rash and Shortness Of Breath   Codeine Sulfate Itching   Red Dye #40 (Allura Red) Hives   Sulfa Antibiotics Other (See Comments)    "Stomach pain"    Amoxicillin Rash   Keflex [Cephalexin] Swelling and Rash   Penicillin V Potassium Rash    Family History  Problem Relation Age of Onset   Stroke Mother    Depression Mother    Interstitial cystitis Mother    Fibromyalgia Mother    Diabetes Mother    Post-traumatic stress disorder Mother    Anxiety disorder Mother    Obesity Mother    Heart disease Father    Throat cancer Father    Hypertension Father    High Cholesterol Father    Depression Father    Alcoholism Father    Healthy Sister    Aortic aneurysm Brother 28   Arthritis Paternal Grandmother    Lung cancer Paternal Grandmother    Heart disease Paternal Grandmother    Stroke Paternal Grandmother    Hypertension Paternal Grandmother    Breast cancer Neg Hx     Social History   Socioeconomic History   Marital status: Married    Spouse name: Tinnie Gens   Number of children: 6   Years of education: Not on file   Highest education level: Some college, no degree  Occupational History   Occupation: Admin Assist  Tobacco Use   Smoking status: Former    Current packs/day: 0.00    Types: Cigarettes    Quit date: 07/01/2011    Years since quitting: 12.6    Passive exposure: Past   Smokeless tobacco: Never  Vaping Use   Vaping status: Never Used  Substance and Sexual Activity   Alcohol use: No    Alcohol/week: 0.0 standard drinks of alcohol   Drug use: No   Sexual activity: Yes    Birth control/protection: I.U.D.  Other Topics Concern   Not on file  Social History Narrative   Married.   6 children.   Works as a Futures trader.      Social Drivers of Corporate investment banker Strain: Not on file  Food Insecurity: Not on file  Transportation Needs: Not on file  Physical Activity: Not on file  Stress: Not on file  Social Connections: Not on file  Intimate Partner Violence: Not on file     Constitutional: Denies fever, malaise, fatigue, headache or abrupt weight changes.  Respiratory: Denies difficulty  breathing, shortness of breath, cough or sputum production.   Cardiovascular: Denies chest pain, chest tightness, palpitations or swelling in the hands or feet.  Skin: Patient reports rash.  Denies ulcercations.   No other specific complaints in a complete review of systems (except as listed in HPI above).  Observations/Objective:   Wt Readings from Last 3  Encounters:  02/01/24 209 lb (94.8 kg)  01/02/24 212 lb (96.2 kg)  12/22/23 219 lb 6.4 oz (99.5 kg)    General: Appears her stated age, obese, in NAD. Skin: 4 mm papule on erythematous base noted of right wrist Pulmonary/Chest: Normal effort. Neurological: Alert and oriented.   BMET    Component Value Date/Time   NA 137 11/06/2023 1328   NA 137 05/26/2014 1855   K 4.4 11/06/2023 1328   K 3.3 (L) 05/26/2014 1855   CL 103 11/06/2023 1328   CL 105 05/26/2014 1855   CO2 21 11/06/2023 1328   CO2 20 (L) 05/26/2014 1855   GLUCOSE 81 11/06/2023 1328   GLUCOSE 92 05/22/2023 1015   GLUCOSE 131 (H) 05/26/2014 1855   BUN 13 11/06/2023 1328   BUN 5 (L) 05/26/2014 1855   CREATININE 0.74 11/06/2023 1328   CREATININE 0.81 05/22/2023 1015   CALCIUM 9.2 11/06/2023 1328   CALCIUM 8.4 (L) 05/26/2014 1855   GFRNONAA >60 01/24/2018 1543   GFRNONAA >60 05/26/2014 1855   GFRAA >60 01/24/2018 1543   GFRAA >60 05/26/2014 1855    Lipid Panel     Component Value Date/Time   CHOL 165 05/22/2023 1015   TRIG 114 05/22/2023 1015   HDL 50 05/22/2023 1015   CHOLHDL 3.3 05/22/2023 1015   VLDL 13.4 10/16/2015 0910   LDLCALC 94 05/22/2023 1015    CBC    Component Value Date/Time   WBC 8.6 05/22/2023 1015   RBC 4.65 05/22/2023 1015   HGB 13.3 05/22/2023 1015   HGB 13.5 12/02/2021 1557   HCT 40.5 05/22/2023 1015   HCT 39.5 12/02/2021 1557   PLT 121 (L) 05/22/2023 1015   PLT 227 12/02/2021 1557   MCV 87.1 05/22/2023 1015   MCV 83 12/02/2021 1557   MCV 87 05/26/2014 1855   MCH 28.6 05/22/2023 1015   MCHC 32.8 05/22/2023 1015   RDW  13.1 05/22/2023 1015   RDW 13.5 12/02/2021 1557   RDW 18.2 (H) 05/26/2014 1855   LYMPHSABS 2.4 12/02/2021 1557   MONOABS 0.4 04/14/2015 1644   EOSABS 0.0 12/02/2021 1557   BASOSABS 0.0 12/02/2021 1557    Hgb A1C Lab Results  Component Value Date   HGBA1C 5.7 (H) 10/25/2023       Assessment and Plan:  Assessment and Plan    Recurrent skin lesions Recurrent painful lesions post-exercise, likely due to sweat and bacterial exposure. Differential includes heat rash, blocked sweat pores, or bacterial folliculitis. - Continue daily Zyrtec. - Prescribe mupirocin 2% cream for lesions twice daily as needed. - Report if lesions persist or worsen. - Consider in-person evaluation if no improvement.       RTC in 3 months for follow-up of chronic conditions  Follow Up Instructions:    I discussed the assessment and treatment plan with the patient. The patient was provided an opportunity to ask questions and all were answered. The patient agreed with the plan and demonstrated an understanding of the instructions.   The patient was advised to call back or seek an in-person evaluation if the symptoms worsen or if the condition fails to improve as anticipated.   Martha Reaper, NP

## 2024-02-18 ENCOUNTER — Other Ambulatory Visit: Payer: Self-pay | Admitting: Internal Medicine

## 2024-02-19 ENCOUNTER — Other Ambulatory Visit: Payer: Self-pay | Admitting: Internal Medicine

## 2024-02-20 ENCOUNTER — Ambulatory Visit: Admitting: Obstetrics and Gynecology

## 2024-02-20 NOTE — Telephone Encounter (Signed)
 Requested Prescriptions  Pending Prescriptions Disp Refills   pantoprazole (PROTONIX) 40 MG tablet [Pharmacy Med Name: PANTOPRAZOLE SOD DR 40 MG TAB] 180 tablet 1    Sig: TAKE 1 TABLET BY MOUTH TWICE A DAY     Gastroenterology: Proton Pump Inhibitors Passed - 02/20/2024 11:44 AM      Passed - Valid encounter within last 12 months    Recent Outpatient Visits           1 week ago Rash and nonspecific skin eruption   [place] [facility] [place], Rankin Buzzard, NP   2 months ago Encounter for general adult medical examination with abnormal findings   [place] [facility] [place], Rankin Buzzard, NP       Future Appointments             In 4 months Baity, Rankin Buzzard, NP [facility]   In 6 months Elta Halter, MD [facility] [place]

## 2024-02-20 NOTE — Telephone Encounter (Signed)
 Requested medication (s) are due for refill today: yes  Requested medication (s) are on the active medication list: yes  Last refill:  01/04/24 #90   Future visit scheduled: yes  Notes to clinic:  last platelet level low please review this request   Requested Prescriptions  Pending Prescriptions Disp Refills   ibuprofen (ADVIL) 800 MG tablet [Pharmacy Med Name: IBUPROFEN 800 MG TABLET] 90 tablet 0    Sig: TAKE 1 TABLET BY MOUTH EVERY 8 HOURS AS NEEDED     Analgesics:  NSAIDS Failed - 02/20/2024  9:25 AM      Failed - Manual Review: Labs are only required if the patient has taken medication for more than 8 weeks.      Failed - PLT in normal range and within 360 days    Platelets  Date Value Ref Range Status  05/22/2023 121 (L) 140 - 400 Thousand/uL Final  12/02/2021 227 150 - 450 x10E3/uL Final         Passed - Cr in normal range and within 360 days    Creat  Date Value Ref Range Status  05/22/2023 0.81 0.50 - 0.99 mg/dL Final   Creatinine, Ser  Date Value Ref Range Status  11/06/2023 0.74 0.57 - 1.00 mg/dL Final         Passed - HGB in normal range and within 360 days    Hemoglobin  Date Value Ref Range Status  05/22/2023 13.3 11.7 - 15.5 g/dL Final  64/40/3474 25.9 11.1 - 15.9 g/dL Final         Passed - HCT in normal range and within 360 days    HCT  Date Value Ref Range Status  05/22/2023 40.5 35.0 - 45.0 % Final   Hematocrit  Date Value Ref Range Status  12/02/2021 39.5 34.0 - 46.6 % Final         Passed - eGFR is 30 or above and within 360 days    EGFR (African American)  Date Value Ref Range Status  05/26/2014 >60  Final   GFR calc Af Amer  Date Value Ref Range Status  01/24/2018 >60 >60 mL/min Final    Comment:    (NOTE) The eGFR has been calculated using the CKD EPI equation. This calculation has not been validated in all clinical situations. eGFR's persistently <60 mL/min signify possible Chronic Kidney Disease.    EGFR (Non-African Amer.)   Date Value Ref Range Status  05/26/2014 >60  Final    Comment:    eGFR values <59mL/min/1.73 m2 may be an indication of chronic kidney disease (CKD). Calculated eGFR is useful in patients with stable renal function. The eGFR calculation will not be reliable in acutely ill patients when serum creatinine is changing rapidly. It is not useful in  patients on dialysis. The eGFR calculation may not be applicable to patients at the low and high extremes of body sizes, pregnant women, and vegetarians.    GFR calc non Af Amer  Date Value Ref Range Status  01/24/2018 >60 >60 mL/min Final   GFR  Date Value Ref Range Status  08/12/2016 80.81 >60.00 mL/min Final   eGFR  Date Value Ref Range Status  11/06/2023 102 >59 mL/min/1.73 Final         Passed - Patient is not pregnant      Passed - Valid encounter within last 12 months    Recent Outpatient Visits           1 week ago Rash and  nonspecific skin eruption   [place] [facility] [place], Rankin Buzzard, NP   2 months ago Encounter for general adult medical examination with abnormal findings   [place] [facility] [place], Rankin Buzzard, NP       Future Appointments             In 4 months Baity, Rankin Buzzard, NP [facility], [place]   In 6 months Elta Halter, MD [facility] [place] [facility]

## 2024-02-26 ENCOUNTER — Encounter: Payer: Self-pay | Admitting: Internal Medicine

## 2024-02-26 ENCOUNTER — Other Ambulatory Visit: Payer: Self-pay

## 2024-02-26 ENCOUNTER — Other Ambulatory Visit: Payer: Self-pay | Admitting: Internal Medicine

## 2024-02-26 MED ORDER — IBUPROFEN 800 MG PO TABS: 800.0000 mg | ORAL_TABLET | Freq: Three times a day (TID) | ORAL | 0 refills | Status: DC | PRN

## 2024-02-27 NOTE — Telephone Encounter (Signed)
 Duplicate request. Rx reordered 02/26/24 Requested Prescriptions  Pending Prescriptions Disp Refills   ibuprofen  (ADVIL ) 800 MG tablet [Pharmacy Med Name: IBUPROFEN  800 MG TABLET] 90 tablet 0    Sig: TAKE 1 TABLET BY MOUTH EVERY 8 HOURS AS NEEDED     Analgesics:  NSAIDS Failed - 02/27/2024  9:20 AM      Failed - Manual Review: Labs are only required if the patient has taken medication for more than 8 weeks.      Failed - PLT in normal range and within 360 days    Platelets  Date Value Ref Range Status  05/22/2023 121 (L) 140 - 400 Thousand/uL Final  12/02/2021 227 150 - 450 x10E3/uL Final         Passed - Cr in normal range and within 360 days    Creat  Date Value Ref Range Status  05/22/2023 0.81 0.50 - 0.99 mg/dL Final   Creatinine, Ser  Date Value Ref Range Status  11/06/2023 0.74 0.57 - 1.00 mg/dL Final         Passed - HGB in normal range and within 360 days    Hemoglobin  Date Value Ref Range Status  05/22/2023 13.3 11.7 - 15.5 g/dL Final  16/08/9603 54.0 11.1 - 15.9 g/dL Final         Passed - HCT in normal range and within 360 days    HCT  Date Value Ref Range Status  05/22/2023 40.5 35.0 - 45.0 % Final   Hematocrit  Date Value Ref Range Status  12/02/2021 39.5 34.0 - 46.6 % Final         Passed - eGFR is 30 or above and within 360 days    EGFR (African American)  Date Value Ref Range Status  05/26/2014 >60  Final   GFR calc Af Amer  Date Value Ref Range Status  01/24/2018 >60 >60 mL/min Final    Comment:    (NOTE) The eGFR has been calculated using the CKD EPI equation. This calculation has not been validated in all clinical situations. eGFR's persistently <60 mL/min signify possible Chronic Kidney Disease.    EGFR (Non-African Amer.)  Date Value Ref Range Status  05/26/2014 >60  Final    Comment:    eGFR values <32mL/min/1.73 m2 may be an indication of chronic kidney disease (CKD). Calculated eGFR is useful in patients with stable renal  function. The eGFR calculation will not be reliable in acutely ill patients when serum creatinine is changing rapidly. It is not useful in  patients on dialysis. The eGFR calculation may not be applicable to patients at the low and high extremes of body sizes, pregnant women, and vegetarians.    GFR calc non Af Amer  Date Value Ref Range Status  01/24/2018 >60 >60 mL/min Final   GFR  Date Value Ref Range Status  08/12/2016 80.81 >60.00 mL/min Final   eGFR  Date Value Ref Range Status  11/06/2023 102 >59 mL/min/1.73 Final         Passed - Patient is not pregnant      Passed - Valid encounter within last 12 months    Recent Outpatient Visits           2 weeks ago Rash and nonspecific skin eruption   [place] [facility] [place], Rankin Buzzard, NP   2 months ago Encounter for general adult medical examination with abnormal findings   [place] [facility] [place], Rankin Buzzard, NP  Future Appointments             In 3 months Baity, Rankin Buzzard, NP [facility], [place]   In 6 months Elta Halter, MD [facility] [place] [facility]

## 2024-02-28 ENCOUNTER — Ambulatory Visit: Payer: 59 | Admitting: Obstetrics and Gynecology

## 2024-03-05 ENCOUNTER — Ambulatory Visit (INDEPENDENT_AMBULATORY_CARE_PROVIDER_SITE_OTHER): Admitting: Internal Medicine

## 2024-03-05 ENCOUNTER — Encounter (INDEPENDENT_AMBULATORY_CARE_PROVIDER_SITE_OTHER): Payer: Self-pay | Admitting: Internal Medicine

## 2024-03-05 VITALS — BP 104/73 | HR 88 | Temp 97.9°F | Ht 66.0 in | Wt 209.0 lb

## 2024-03-05 DIAGNOSIS — R7303 Prediabetes: Secondary | ICD-10-CM

## 2024-03-05 DIAGNOSIS — E669 Obesity, unspecified: Secondary | ICD-10-CM | POA: Diagnosis not present

## 2024-03-05 DIAGNOSIS — R638 Other symptoms and signs concerning food and fluid intake: Secondary | ICD-10-CM | POA: Diagnosis not present

## 2024-03-05 DIAGNOSIS — G4733 Obstructive sleep apnea (adult) (pediatric): Secondary | ICD-10-CM

## 2024-03-05 DIAGNOSIS — Z6833 Body mass index (BMI) 33.0-33.9, adult: Secondary | ICD-10-CM

## 2024-03-05 MED ORDER — METFORMIN HCL ER 500 MG PO TB24: 500.0000 mg | ORAL_TABLET | Freq: Two times a day (BID) | ORAL | 0 refills | Status: DC

## 2024-03-05 MED ORDER — PHENTERMINE HCL 37.5 MG PO TABS
18.7500 mg | ORAL_TABLET | Freq: Every day | ORAL | 1 refills | Status: DC
Start: 2024-03-05 — End: 2024-04-30

## 2024-03-05 NOTE — Progress Notes (Signed)
 Office: [phone]  /  Fax: [phone]  Weight Summary And Biometrics  Vitals Temp: 97.9 F (36.6 C) BP: 104/73 Pulse Rate: 88 SpO2: 98 %   Anthropometric Measurements Height: 5\' 6"  (1.676 m) Weight: 209 lb (94.8 kg) BMI (Calculated): 33.75 Weight at Last Visit: 212lb Weight Lost Since Last Visit: 0 lb Weight Gained Since Last Visit: 0 lb Starting Weight: 217 lb Total Weight Loss (lbs): 8 lb (3.629 kg) Peak Weight: 226 lb   Body Composition  Body Fat %: 42.1 % Fat Mass (lbs): 88.2 lbs Muscle Mass (lbs): 115 lbs Total Body Water  (lbs): 79.4 lbs Visceral Fat Rating : 10    No data recorded Today's Visit #: 6  Starting Date: 11/24/22   Subjective   Chief Complaint: Obesity  Interval History Discussed the use of AI scribe software for clinical note transcription with the patient, who gave verbal consent to proceed.  History of Present Illness   Martha Page is a 43 year old female with prediabetes and obstructive sleep apnea who presents for medical weight management.  She follows a 1200 calorie meal plan 70-80% of the time, tracks calories, consumes more whole foods, gets the recommended amount of protein, and maintains adequate hydration. She exercises 4-5 days a week for about 60 minutes, including strength training, cardio, and yoga. She experiences higher levels of stress.  She is on metformin  XR 500 mg twice a day for diabetes prevention and weight management. Lomaira  8 mg in the morning for appetite control has not significantly affected her appetite or sense of fullness after four weeks. She has used phentermine  and Qsymia  in the past without significant results.  She experiences fatigue and daytime somnolence, with episodes of falling asleep easily during the day. She uses a CPAP machine for her sleep apnea but questions the quality of her sleep. She experiences brain fog and occasional difficulty recalling words, attributed to fatigue. Her energy  levels have not improved despite B12 supplementation. She takes venlafaxine  and trazodone . No jitteriness or elevated blood pressure is noted.       Challenges affecting patient progress: moderate to high levels of stress.    Pharmacotherapy for weight management: She is currently taking Metformin  (off label use for incretin effect and / or insulin  resistance and / or diabetes prevention) with adequate clinical response  and without side effects. and Phentermine  (longterm use, single agent)  without clinical response and without side effects..   Assessment and Plan   Treatment Plan For Obesity:  Recommended Dietary Goals  Martha Page is currently in the action stage of change. As such, her goal is to continue weight management plan. She has agreed to: continue current plan  Behavioral Health and Counseling  We discussed the following behavioral modification strategies today: continue to work on maintaining a reduced calorie state, getting the recommended amount of protein, incorporating whole foods, making healthy choices, staying well hydrated and practicing mindfulness when eating..  Additional education and resources provided today: None  Recommended Physical Activity Goals  Martha Page has been advised to work up to 150 minutes of moderate intensity aerobic activity a week and strengthening exercises 2-3 times per week for cardiovascular health, weight loss maintenance and preservation of muscle mass.   She has agreed to :  continue to gradually increase the amount and intensity of exercise routine  Pharmacotherapy  We discussed various medication options to help Martha Page with her weight loss efforts and we both agreed to :  We will change Lomaira  to  Adipex due to lack of clinical response.  She will be started on Adipex 18.75 daily  Associated Conditions Impacted by Obesity Treatment  Class 1 obesity with serious comorbidity and body mass index (BMI) of 33.0 to 33.9 in adult,  unspecified obesity type  Prediabetes     Assessment and Plan    Severe obstructive sleep apnea Severe obstructive sleep apnea with an AHI of 56. Currently on PAP therapy. Reports adequate sleep but experiences daytime somnolence, suggesting possible inadequate or inefficient sleep. Discussed the possibility of a repeat sleep study to assess the adequacy of current PAP therapy and oxygen levels during sleep. Emphasized the importance of addressing sleep issues as sleep deprivation can significantly impact health and lifespan. - Consider repeat sleep study to assess PAP therapy adequacy and oxygen levels during sleep. - Schedule appointment with PCP to discuss fatigue and potential need for further sleep evaluation. - Explore resources from the [facility] for sleep hygiene improvement.  Obesity/abnormal food appetite Abnormal food appetite previously managed with Lomaira , which was ineffective. Plan to switch to Adipex 18.75 mg daily with potential to increase to 37 mg based on clinical response. Discussed adding topiramate  for synergistic effect on appetite suppression. Discussed the off-label use of medications and the importance of monitoring blood pressure and heart rate with phentermine  use. Explained that phentermine  is FDA approved for short-term use but can be used long-term if there is a clinical response. Discussed potential side effects of venlafaxine , including fatigue, which affects 9-10% of patients. - Discontinue Lomaira . - Start Adipex 18.75 mg once daily. - Consider increasing Adipex to 37 mg based on clinical response and monitoring of blood pressure and heart rate. - Plan to add topiramate  in the evening for synergistic effect on appetite suppression. - Monitor for side effects of venlafaxine , including fatigue.  Prediabetes Prediabetes managed with metformin  XR 500 mg twice daily. Metformin  is used for both diabetes prevention and weight management through its  effects on insulin  sensitivity. Discussed the cost-effectiveness of obtaining a 90-day supply through mail order. - Continue metformin  XR 500 mg twice daily. - Provide 90-day prescription for metformin , available through mail order for cost savings.        Objective   Physical Exam:  Blood pressure 104/73, pulse 88, temperature 97.9 F (36.6 C), height 5\' 6"  (1.676 m), weight 209 lb (94.8 kg), SpO2 98%. Body mass index is 33.73 kg/m.  General: She is overweight, cooperative, alert, well developed, and in no acute distress. PSYCH: Has normal mood, affect and thought process.   HEENT: EOMI, sclerae are anicteric. Lungs: Normal breathing effort, no conversational dyspnea. Extremities: No edema.  Neurologic: No gross sensory or motor deficits. No tremors or fasciculations noted.    Diagnostic Data Reviewed:  BMET    Component Value Date/Time   NA 137 11/06/2023 1328   NA 137 05/26/2014 1855   K 4.4 11/06/2023 1328   K 3.3 (L) 05/26/2014 1855   CL 103 11/06/2023 1328   CL 105 05/26/2014 1855   CO2 21 11/06/2023 1328   CO2 20 (L) 05/26/2014 1855   GLUCOSE 81 11/06/2023 1328   GLUCOSE 92 05/22/2023 1015   GLUCOSE 131 (H) 05/26/2014 1855   BUN 13 11/06/2023 1328   BUN 5 (L) 05/26/2014 1855   CREATININE 0.74 11/06/2023 1328   CREATININE 0.81 05/22/2023 1015   CALCIUM  9.2 11/06/2023 1328   CALCIUM  8.4 (L) 05/26/2014 1855   GFRNONAA >60 01/24/2018 1543   GFRNONAA >60 05/26/2014 1855  GFRAA >60 01/24/2018 1543   GFRAA >60 05/26/2014 1855   Lab Results  Component Value Date   HGBA1C 5.7 (H) 10/25/2023   HGBA1C 5.3 10/16/2015   Lab Results  Component Value Date   INSULIN  11.0 10/25/2023   Lab Results  Component Value Date   TSH 1.44 08/24/2022   CBC    Component Value Date/Time   WBC 8.6 05/22/2023 1015   RBC 4.65 05/22/2023 1015   HGB 13.3 05/22/2023 1015   HGB 13.5 12/02/2021 1557   HCT 40.5 05/22/2023 1015   HCT 39.5 12/02/2021 1557   PLT 121 (L)  05/22/2023 1015   PLT 227 12/02/2021 1557   MCV 87.1 05/22/2023 1015   MCV 83 12/02/2021 1557   MCV 87 05/26/2014 1855   MCH 28.6 05/22/2023 1015   MCHC 32.8 05/22/2023 1015   RDW 13.1 05/22/2023 1015   RDW 13.5 12/02/2021 1557   RDW 18.2 (H) 05/26/2014 1855   Iron Studies    Component Value Date/Time   IRON 63 11/06/2023 1328   TIBC 276 11/06/2023 1328   FERRITIN 14 (L) 11/06/2023 1328   IRONPCTSAT 23 11/06/2023 1328   Lipid Panel     Component Value Date/Time   CHOL 165 05/22/2023 1015   TRIG 114 05/22/2023 1015   HDL 50 05/22/2023 1015   CHOLHDL 3.3 05/22/2023 1015   VLDL 13.4 10/16/2015 0910   LDLCALC 94 05/22/2023 1015   Hepatic Function Panel     Component Value Date/Time   PROT 7.0 11/06/2023 1328   PROT 7.5 12/02/2013 1017   ALBUMIN 4.2 11/06/2023 1328   ALBUMIN 3.4 12/02/2013 1017   AST 29 11/06/2023 1328   AST 21 12/02/2013 1017   ALT 35 (H) 11/06/2023 1328   ALT 49 12/02/2013 1017   ALKPHOS 133 (H) 11/06/2023 1328   ALKPHOS 90 12/02/2013 1017   BILITOT 0.3 11/06/2023 1328   BILITOT 0.3 12/02/2013 1017   BILIDIR 0.1 01/27/2017 0844      Component Value Date/Time   TSH 1.44 08/24/2022 1123   Nutritional Lab Results  Component Value Date   VD25OH 19.0 (L) 10/25/2023   VD25OH 27 (L) 08/24/2022   VD25OH 19.16 (L) 01/27/2017    Medications: Outpatient Encounter Medications as of 03/05/2024  Medication Sig   cetirizine  (ZYRTEC ) 10 MG tablet TAKE 1 TABLET BY MOUTH EVERY DAY   ibuprofen  (ADVIL ) 800 MG tablet Take 1 tablet (800 mg total) by mouth every 8 (eight) hours as needed.   metFORMIN  (GLUCOPHAGE -XR) 500 MG 24 hr tablet Take 1 tablet (500 mg total) by mouth 2 (two) times daily with a meal.   MIRENA, 52 MG, 20 MCG/24HR IUD    pantoprazole  (PROTONIX ) 40 MG tablet TAKE 1 TABLET BY MOUTH TWICE A DAY   Phentermine  HCl (LOMAIRA ) 8 MG TABS Take 1 tablet (8 mg total) by mouth daily.   SUMAtriptan  (IMITREX ) 100 MG tablet Take 1 tablet (100 mg total) by  mouth every 2 (two) hours as needed for migraine. May repeat in 2 hours if headache persists or recurs.   traZODone  (DESYREL ) 50 MG tablet Take 100 mg by mouth at bedtime as needed.   venlafaxine  XR (EFFEXOR -XR) 150 MG 24 hr capsule Take 1 capsule (150 mg total) by mouth daily with breakfast.   Vitamin D , Ergocalciferol , (DRISDOL ) 1.25 MG (50000 UNIT) CAPS capsule Take 1 capsule (50,000 Units total) by mouth every 7 (seven) days.   [DISCONTINUED] cyanocobalamin  (VITAMIN B12) 1000 MCG/ML injection INJECT 1ML INTO THE MUSCLE  EVERY 30 DAYS   [DISCONTINUED] ketoconazole  (NIZORAL ) 2 % shampoo Apply 1 Application topically 3 (three) times a week. Wash scalp, chest, back 3 times weekly, let sit 5-10 minutes and rinse out   [DISCONTINUED] LORazepam (ATIVAN) 0.5 MG tablet Take 0.25 mg by mouth 2 (two) times daily as needed.   [DISCONTINUED] mupirocin  ointment (BACTROBAN ) 2 % Apply topically 2 (two) times daily.   No facility-administered encounter medications on file as of 03/05/2024.     Follow-Up   No follow-ups on file.Aaron Aas She was informed of the importance of frequent follow up visits to maximize her success with intensive lifestyle modifications for her multiple health conditions.  Attestation Statement   Reviewed by clinician on day of visit: allergies, medications, problem list, medical history, surgical history, family history, social history, and previous encounter notes.     Ladd Picker, MD

## 2024-03-16 ENCOUNTER — Other Ambulatory Visit (INDEPENDENT_AMBULATORY_CARE_PROVIDER_SITE_OTHER): Payer: Self-pay | Admitting: Internal Medicine

## 2024-03-24 ENCOUNTER — Other Ambulatory Visit: Payer: Self-pay | Admitting: Internal Medicine

## 2024-03-26 NOTE — Telephone Encounter (Signed)
 Requested Prescriptions  Pending Prescriptions Disp Refills   pantoprazole  (PROTONIX ) 40 MG tablet [Pharmacy Med Name: PANTOPRAZOLE  SOD DR 40 MG TAB] 60 tablet 2    Sig: TAKE 1 TABLET BY MOUTH TWICE A DAY     Gastroenterology: Proton Pump Inhibitors Passed - 03/26/2024  2:57 PM      Passed - Valid encounter within last 12 months    Recent Outpatient Visits           1 month ago Rash and nonspecific skin eruption   [place] [facility] [place], Rankin Buzzard, NP   3 months ago Encounter for general adult medical examination with abnormal findings   [place] [facility] [place], Rankin Buzzard, NP       Future Appointments             In 2 months Baity, Rankin Buzzard, NP [place] [facility]   In 5 months Elta Halter, MD [facility]

## 2024-03-30 ENCOUNTER — Other Ambulatory Visit (INDEPENDENT_AMBULATORY_CARE_PROVIDER_SITE_OTHER): Payer: Self-pay | Admitting: Internal Medicine

## 2024-03-30 DIAGNOSIS — R7303 Prediabetes: Secondary | ICD-10-CM

## 2024-04-02 ENCOUNTER — Encounter (INDEPENDENT_AMBULATORY_CARE_PROVIDER_SITE_OTHER): Payer: Self-pay | Admitting: Internal Medicine

## 2024-04-03 ENCOUNTER — Other Ambulatory Visit (INDEPENDENT_AMBULATORY_CARE_PROVIDER_SITE_OTHER): Payer: Self-pay

## 2024-04-03 ENCOUNTER — Other Ambulatory Visit (INDEPENDENT_AMBULATORY_CARE_PROVIDER_SITE_OTHER): Payer: Self-pay | Admitting: Internal Medicine

## 2024-04-03 DIAGNOSIS — R7303 Prediabetes: Secondary | ICD-10-CM

## 2024-04-03 MED ORDER — METFORMIN HCL ER 500 MG PO TB24
500.0000 mg | ORAL_TABLET | Freq: Two times a day (BID) | ORAL | 0 refills | Status: DC
Start: 2024-04-03 — End: 2024-07-03

## 2024-04-03 NOTE — Telephone Encounter (Signed)
 Called patient to inform her that the request for Metformin  was sent to [facility] on 03/05/24 with three months worthy of medication. This request she be enough till the patient next appointment on 04/30/24. Patient states she will reach out to her Mailing pharmacy and updated us  if she has any issues. Pt had no questions at this time but was encouraged to call back if questions arise.

## 2024-04-08 ENCOUNTER — Encounter: Payer: Self-pay | Admitting: Internal Medicine

## 2024-04-08 ENCOUNTER — Other Ambulatory Visit: Payer: Self-pay

## 2024-04-08 MED ORDER — PANTOPRAZOLE SODIUM 40 MG PO TBEC
40.0000 mg | DELAYED_RELEASE_TABLET | Freq: Two times a day (BID) | ORAL | 0 refills | Status: DC
Start: 2024-04-08 — End: 2024-06-21

## 2024-04-22 ENCOUNTER — Other Ambulatory Visit: Payer: Self-pay | Admitting: Internal Medicine

## 2024-04-24 NOTE — Telephone Encounter (Signed)
 Requested Prescriptions  Pending Prescriptions Disp Refills   ibuprofen  (ADVIL ) 800 MG tablet [Pharmacy Med Name: IBUPROFEN  800 MG TABLET] 90 tablet 0    Sig: TAKE 1 TABLET BY MOUTH EVERY 8 HOURS AS NEEDED     Analgesics:  NSAIDS Failed - 04/24/2024  9:49 AM      Failed - Manual Review: Labs are only required if the patient has taken medication for more than 8 weeks.      Failed - PLT in normal range and within 360 days    Platelets  Date Value Ref Range Status  05/22/2023 121 (L) 140 - 400 Thousand/uL Final  12/02/2021 227 150 - 450 x10E3/uL Final         Passed - Cr in normal range and within 360 days    Creat  Date Value Ref Range Status  05/22/2023 0.81 0.50 - 0.99 mg/dL Final   Creatinine, Ser  Date Value Ref Range Status  11/06/2023 0.74 0.57 - 1.00 mg/dL Final         Passed - HGB in normal range and within 360 days    Hemoglobin  Date Value Ref Range Status  05/22/2023 13.3 11.7 - 15.5 g/dL Final  29/56/2130 86.5 11.1 - 15.9 g/dL Final         Passed - HCT in normal range and within 360 days    HCT  Date Value Ref Range Status  05/22/2023 40.5 35.0 - 45.0 % Final   Hematocrit  Date Value Ref Range Status  12/02/2021 39.5 34.0 - 46.6 % Final         Passed - eGFR is 30 or above and within 360 days    EGFR (African American)  Date Value Ref Range Status  05/26/2014 >60  Final   GFR calc Af Amer  Date Value Ref Range Status  01/24/2018 >60 >60 mL/min Final    Comment:    (NOTE) The eGFR has been calculated using the CKD EPI equation. This calculation has not been validated in all clinical situations. eGFR's persistently <60 mL/min signify possible Chronic Kidney Disease.    EGFR (Non-African Amer.)  Date Value Ref Range Status  05/26/2014 >60  Final    Comment:    eGFR values <49mL/min/1.73 m2 may be an indication of chronic kidney disease (CKD). Calculated eGFR is useful in patients with stable renal function. The eGFR calculation will not be  reliable in acutely ill patients when serum creatinine is changing rapidly. It is not useful in  patients on dialysis. The eGFR calculation may not be applicable to patients at the low and high extremes of body sizes, pregnant women, and vegetarians.    GFR calc non Af Amer  Date Value Ref Range Status  01/24/2018 >60 >60 mL/min Final   GFR  Date Value Ref Range Status  08/12/2016 80.81 >60.00 mL/min Final   eGFR  Date Value Ref Range Status  11/06/2023 102 >59 mL/min/1.73 Final         Passed - Patient is not pregnant      Passed - Valid encounter within last 12 months    Recent Outpatient Visits           2 months ago Rash and nonspecific skin eruption   [place] [facility] [place], Rankin Buzzard, NP   4 months ago Encounter for general adult medical examination with abnormal findings   [place] [facility] [place], Rankin Buzzard, NP  Future Appointments             In 1 month Baity, Rankin Buzzard, NP [facility], [place]   In 4 months Elta Halter, MD [facility]

## 2024-04-27 ENCOUNTER — Other Ambulatory Visit: Payer: Self-pay | Admitting: Internal Medicine

## 2024-04-30 ENCOUNTER — Ambulatory Visit (INDEPENDENT_AMBULATORY_CARE_PROVIDER_SITE_OTHER): Admitting: Internal Medicine

## 2024-04-30 ENCOUNTER — Encounter (INDEPENDENT_AMBULATORY_CARE_PROVIDER_SITE_OTHER): Payer: Self-pay | Admitting: Internal Medicine

## 2024-04-30 VITALS — BP 110/76 | HR 88 | Temp 98.1°F | Ht 66.0 in | Wt 204.0 lb

## 2024-04-30 DIAGNOSIS — R638 Other symptoms and signs concerning food and fluid intake: Secondary | ICD-10-CM

## 2024-04-30 DIAGNOSIS — R7303 Prediabetes: Secondary | ICD-10-CM

## 2024-04-30 DIAGNOSIS — G4733 Obstructive sleep apnea (adult) (pediatric): Secondary | ICD-10-CM

## 2024-04-30 DIAGNOSIS — E66811 Obesity, class 1: Secondary | ICD-10-CM

## 2024-04-30 DIAGNOSIS — Z6833 Body mass index (BMI) 33.0-33.9, adult: Secondary | ICD-10-CM

## 2024-04-30 MED ORDER — PHENTERMINE HCL 37.5 MG PO TABS: 18.7500 mg | ORAL_TABLET | Freq: Every day | ORAL | 0 refills | Status: DC

## 2024-04-30 MED ORDER — TOPIRAMATE 25 MG PO TABS: 25.0000 mg | ORAL_TABLET | Freq: Every evening | ORAL | 0 refills | Status: DC

## 2024-04-30 NOTE — Telephone Encounter (Signed)
 Requested Prescriptions  Refused Prescriptions Disp Refills   pantoprazole  (PROTONIX ) 40 MG tablet [Pharmacy Med Name: PANTOPRAZOLE  SOD DR 40 MG TAB] 60 tablet 2    Sig: TAKE 1 TABLET BY MOUTH TWICE A DAY     Gastroenterology: Proton Pump Inhibitors Passed - 04/30/2024 12:16 PM      Passed - Valid encounter within last 12 months    Recent Outpatient Visits           2 months ago Rash and nonspecific skin eruption   [place] [facility] [place], Angeline ORN, NP   4 months ago Encounter for general adult medical examination with abnormal findings   [place] [facility] [place], Angeline ORN, NP       Future Appointments             In 1 month Baity, Angeline ORN, NP [facility] [facility] [place], [place]   In 4 months Hester Alm BROCKS, MD [facility]

## 2024-04-30 NOTE — Progress Notes (Signed)
 Office: [phone]  /  Fax: [phone]  Weight Summary and Body Composition Analysis (BIA)  Vitals Temp: 98.1 F (36.7 C) BP: 110/76 Pulse Rate: 88 SpO2: 98 %   Anthropometric Measurements Height: 5' 6 (1.676 m) Weight: 204 lb (92.5 kg) BMI (Calculated): 32.94 Weight at Last Visit: 209 lb Weight Lost Since Last Visit: 5 lb Weight Gained Since Last Visit: 0 lb Starting Weight: 217 lb Total Weight Loss (lbs): 13 lb (5.897 kg) Peak Weight: 226 lb   Body Composition  Body Fat %: 41.2 % Fat Mass (lbs): 84.4 lbs Muscle Mass (lbs): 114 lbs Total Body Water  (lbs): 76.2 lbs Visceral Fat Rating : 9    RMR: 1987  Today's Visit #: 7  Starting Date: 11/24/22   Subjective   Chief Complaint: Obesity  Interval History Discussed the use of AI scribe software for clinical note transcription with the patient, who gave verbal consent to proceed.  History of Present Illness   Martha Page is a 43 year old female who presents for medical weight management.  Obesity and weight management - Lost 5 pounds since last office visit - Adhering to a 1200 calorie nutrition plan approximately 70-80% of the time - Tracks calories and desires greater weight loss - Highest recorded weight was 226 pounds - Started weight management in January, with a total weight loss of 13 pounds - Body fat percentage decreased from 44% to 41% - Visceral fat reduced from 11 to 9  Left hip pain and physical activity limitation - Experiences limitations in physical activity due to left hip pain - Received an injection into the left hip joint last Friday without relief to date - Continues to walk but unable to participate in exercise classes requiring specific leg movements  Appetite and medication effects - Currently taking phentermine  18.75 mg and metformin  for prediabetes - Decreased appetite, particularly midday - Reduction in snacking - No jitteriness or anxiety - Sense of fullness  after eating  Sleep apnea - History of sleep apnea - Uses a CPAP machine - Denied insurance coverage for GLP-1 medication   Dietary habits and renal history - Drinks one soda daily - No history of kidney stones  Gynecologic history - Uses an intrauterine device (IUD) - Status post tubal removal, no risk of pregnancy       Challenges affecting patient progress: strong hunger signals and/or impaired satiety / inhibitory control.    Pharmacotherapy for weight management: She is currently taking Metformin  (off label use for incretin effect and / or insulin  resistance and / or diabetes prevention) with adequate clinical response  and without side effects. and Phentermine  (longterm use, single agent)  with adequate clinical response  and without side effects..   Assessment and Plan   Treatment Plan For Obesity:  Recommended Dietary Goals  Martha Page is currently in the action stage of change. As such, her goal is to continue weight management plan. She has agreed to: continue current plan  Behavioral Health and Counseling  We discussed the following behavioral modification strategies today: continue to work on maintaining a reduced calorie state, getting the recommended amount of protein, incorporating whole foods, making healthy choices, staying well hydrated and practicing mindfulness when eating..  Additional education and resources provided today: None  Recommended Physical Activity Goals  Martha Page has been advised to work up to 150 minutes of moderate intensity aerobic activity a week and strengthening exercises 2-3 times per week for cardiovascular health, weight loss maintenance and preservation of muscle  mass.   She has agreed to :  Think about enjoyable ways to increase daily physical activity and overcoming barriers to exercise and Increase physical activity in their day and reduce sedentary time (increase NEAT).  Medical Interventions and Pharmacotherapy  We discussed  various medication options to help Martha Page with her weight loss efforts and we both agreed to :   Initiated topiramate  for off-label use in weight management. Explained medication is FDA-approved for seizures/migraines but not weight loss alone. Reviewed evidence of efficacy in appetite suppression and use in combination weight loss therapy (e.g., Qsymia ).  She is also on phentermine  off label single agent.  Tolerating medication well without any adverse effects  Risks reviewed: cognitive slowing, fatigue, paresthesia, mood effects, teratogenicity (need for reliable means of birth control in women of childbearing age). Alternatives discussed. Patient informed of off-label nature and consents to treatment. Will titrate dose gradually and monitor monthly.   Does not have coverage for GLP-1   Associated Conditions Impacted by Obesity Treatment  OSA (obstructive sleep apnea)  Class 1 obesity with serious comorbidity and body mass index (BMI) of 33.0 to 33.9 in adult, unspecified obesity type -     Phentermine  HCl; Take 0.5 tablets (18.75 mg total) by mouth daily before breakfast.  Dispense: 15 tablet; Refill: 0 -     Topiramate ; Take 1 tablet (25 mg total) by mouth at bedtime.  Dispense: 30 tablet; Refill: 0  Prediabetes  Abnormal food appetite   Assessment and Plan    Obesity /abnormal food appetite Undergoing medical weight management with a total loss of 13 pounds, including 5 pounds since the last visit. Adheres to a 1200-calorie nutrition plan 70-80% of the time and tracks calories. On phentermine  18.75 mg and metformin  for weight management. Reports decreased appetite and snacking, with no jitteriness or anxiety. Emphasis on body composition over BMI, with muscle mass preserved and body fat percentage reduced from 44% to 41%. Visceral fat reduced from 11 to 9. Discussed gradual, sustainable weight loss to adjust body's fat mass set point and improve health, noting that rapid weight loss can  lead to muscle loss and metabolic decline. - Continue phentermine  18.75 mg - Add topiramate  in the evening - Monitor for mood changes, numbness, tingling, and other side effects as described - Encourage focus on body composition and gradual, sustainable weight loss - Advise on increasing intake of fruits, vegetables, and whole grains while reducing processed foods and simple sugars - Discussed risks of topiramate  including mood alterations, word-finding problems, and potential birth defects; advised on reliable contraception    Sleep Apnea Has sleep apnea and is using CPAP. Insurance denied coverage for GLP-1 agonists using sleep apnea as a diagnosis. Plan to revisit this with the insurance company, especially if weight loss progress is shown with the current medication regimen. - She would benefit from GLP-1 but not covered by her insurance this will be revisited in 4 months.  Prediabetes Metformin  for pharmacoprophylaxis without any adverse effects continue medication.  Monitor for metabolic acidosis while on topiramate .       Objective   Physical Exam:  Blood pressure 110/76, pulse 88, temperature 98.1 F (36.7 C), height 5' 6 (1.676 m), weight 204 lb (92.5 kg), SpO2 98%. Body mass index is 32.93 kg/m.  General: She is overweight, cooperative, alert, well developed, and in no acute distress. PSYCH: Has normal mood, affect and thought process.   HEENT: EOMI, sclerae are anicteric. Lungs: Normal breathing effort, no conversational dyspnea. Extremities: No edema.  Neurologic: No gross sensory or motor deficits. No tremors or fasciculations noted.    Diagnostic Data Reviewed:  BMET    Component Value Date/Time   NA 137 11/06/2023 1328   NA 137 05/26/2014 1855   K 4.4 11/06/2023 1328   K 3.3 (L) 05/26/2014 1855   CL 103 11/06/2023 1328   CL 105 05/26/2014 1855   CO2 21 11/06/2023 1328   CO2 20 (L) 05/26/2014 1855   GLUCOSE 81 11/06/2023 1328   GLUCOSE 92 05/22/2023  1015   GLUCOSE 131 (H) 05/26/2014 1855   BUN 13 11/06/2023 1328   BUN 5 (L) 05/26/2014 1855   CREATININE 0.74 11/06/2023 1328   CREATININE 0.81 05/22/2023 1015   CALCIUM  9.2 11/06/2023 1328   CALCIUM  8.4 (L) 05/26/2014 1855   GFRNONAA >60 01/24/2018 1543   GFRNONAA >60 05/26/2014 1855   GFRAA >60 01/24/2018 1543   GFRAA >60 05/26/2014 1855   Lab Results  Component Value Date   HGBA1C 5.7 (H) 10/25/2023   HGBA1C 5.3 10/16/2015   Lab Results  Component Value Date   INSULIN  11.0 10/25/2023   Lab Results  Component Value Date   TSH 1.44 08/24/2022   CBC    Component Value Date/Time   WBC 8.6 05/22/2023 1015   RBC 4.65 05/22/2023 1015   HGB 13.3 05/22/2023 1015   HGB 13.5 12/02/2021 1557   HCT 40.5 05/22/2023 1015   HCT 39.5 12/02/2021 1557   PLT 121 (L) 05/22/2023 1015   PLT 227 12/02/2021 1557   MCV 87.1 05/22/2023 1015   MCV 83 12/02/2021 1557   MCV 87 05/26/2014 1855   MCH 28.6 05/22/2023 1015   MCHC 32.8 05/22/2023 1015   RDW 13.1 05/22/2023 1015   RDW 13.5 12/02/2021 1557   RDW 18.2 (H) 05/26/2014 1855   Iron Studies    Component Value Date/Time   IRON 63 11/06/2023 1328   TIBC 276 11/06/2023 1328   FERRITIN 14 (L) 11/06/2023 1328   IRONPCTSAT 23 11/06/2023 1328   Lipid Panel     Component Value Date/Time   CHOL 165 05/22/2023 1015   TRIG 114 05/22/2023 1015   HDL 50 05/22/2023 1015   CHOLHDL 3.3 05/22/2023 1015   VLDL 13.4 10/16/2015 0910   LDLCALC 94 05/22/2023 1015   Hepatic Function Panel     Component Value Date/Time   PROT 7.0 11/06/2023 1328   PROT 7.5 12/02/2013 1017   ALBUMIN 4.2 11/06/2023 1328   ALBUMIN 3.4 12/02/2013 1017   AST 29 11/06/2023 1328   AST 21 12/02/2013 1017   ALT 35 (H) 11/06/2023 1328   ALT 49 12/02/2013 1017   ALKPHOS 133 (H) 11/06/2023 1328   ALKPHOS 90 12/02/2013 1017   BILITOT 0.3 11/06/2023 1328   BILITOT 0.3 12/02/2013 1017   BILIDIR 0.1 01/27/2017 0844      Component Value Date/Time   TSH 1.44  08/24/2022 1123   Nutritional Lab Results  Component Value Date   VD25OH 19.0 (L) 10/25/2023   VD25OH 27 (L) 08/24/2022   VD25OH 19.16 (L) 01/27/2017    Medications: Outpatient Encounter Medications as of 04/30/2024  Medication Sig   cetirizine  (ZYRTEC ) 10 MG tablet TAKE 1 TABLET BY MOUTH EVERY DAY   ibuprofen  (ADVIL ) 800 MG tablet TAKE 1 TABLET BY MOUTH EVERY 8 HOURS AS NEEDED   metFORMIN  (GLUCOPHAGE -XR) 500 MG 24 hr tablet Take 1 tablet (500 mg total) by mouth 2 (two) times daily with a meal.   MIRENA, 52 MG, 20  MCG/24HR IUD    pantoprazole  (PROTONIX ) 40 MG tablet Take 1 tablet (40 mg total) by mouth 2 (two) times daily.   SUMAtriptan  (IMITREX ) 100 MG tablet Take 1 tablet (100 mg total) by mouth every 2 (two) hours as needed for migraine. May repeat in 2 hours if headache persists or recurs.   topiramate  (TOPAMAX ) 25 MG tablet Take 1 tablet (25 mg total) by mouth at bedtime.   traZODone  (DESYREL ) 50 MG tablet Take 100 mg by mouth at bedtime as needed.   venlafaxine  XR (EFFEXOR -XR) 150 MG 24 hr capsule Take 1 capsule (150 mg total) by mouth daily with breakfast.   [DISCONTINUED] phentermine  (ADIPEX-P ) 37.5 MG tablet Take 0.5 tablets (18.75 mg total) by mouth daily before breakfast.   phentermine  (ADIPEX-P ) 37.5 MG tablet Take 0.5 tablets (18.75 mg total) by mouth daily before breakfast.   [DISCONTINUED] Vitamin D , Ergocalciferol , (DRISDOL ) 1.25 MG (50000 UNIT) CAPS capsule Take 1 capsule (50,000 Units total) by mouth every 7 (seven) days.   No facility-administered encounter medications on file as of 04/30/2024.     Follow-Up   Return in about 4 weeks (around 05/28/2024) for For Weight Mangement with Dr. Francyne.SABRA She was informed of the importance of frequent follow up visits to maximize her success with intensive lifestyle modifications for her multiple health conditions.  Attestation Statement   Reviewed by clinician on day of visit: allergies, medications, problem list, medical  history, surgical history, family history, social history, and previous encounter notes.     Lucas Francyne, MD

## 2024-05-19 ENCOUNTER — Other Ambulatory Visit: Payer: Self-pay | Admitting: Internal Medicine

## 2024-05-21 NOTE — Telephone Encounter (Signed)
 Requested Prescriptions  Pending Prescriptions Disp Refills   SUMAtriptan  (IMITREX ) 100 MG tablet [Pharmacy Med Name: SUMATRIPTAN  SUCC 100 MG TABLET] 10 tablet 1    Sig: TAKE 1 TABLET (100 MG TOTAL) BY MOUTH EVERY 2 (TWO) HOURS AS NEEDED FOR MIGRAINE. MAY REPEAT IN 2 HOURS IF HEADACHE PERSISTS OR RECURS.     Neurology:  Migraine Therapy - Triptan Passed - 05/21/2024  2:16 PM      Passed - Last BP in normal range    BP Readings from Last 1 Encounters:  04/30/24 110/76         Passed - Valid encounter within last 12 months    Recent Outpatient Visits           3 months ago Rash and nonspecific skin eruption   [place] [facility] [place], Angeline ORN, NP   5 months ago Encounter for general adult medical examination with abnormal findings   [place] [facility] [place], Angeline ORN, NP       Future Appointments             In 1 month Baity, Angeline ORN, NP [facility], [place]   In 3 months Hester Alm BROCKS, MD [facility]

## 2024-05-28 ENCOUNTER — Telehealth: Payer: Self-pay | Admitting: Neurology

## 2024-05-28 NOTE — Telephone Encounter (Signed)
 Appointment made

## 2024-05-31 ENCOUNTER — Other Ambulatory Visit: Payer: Self-pay | Admitting: Internal Medicine

## 2024-06-01 ENCOUNTER — Other Ambulatory Visit (INDEPENDENT_AMBULATORY_CARE_PROVIDER_SITE_OTHER): Payer: Self-pay | Admitting: Internal Medicine

## 2024-06-01 DIAGNOSIS — Z6833 Body mass index (BMI) 33.0-33.9, adult: Secondary | ICD-10-CM

## 2024-06-03 ENCOUNTER — Encounter (INDEPENDENT_AMBULATORY_CARE_PROVIDER_SITE_OTHER): Payer: Self-pay | Admitting: Internal Medicine

## 2024-06-03 ENCOUNTER — Ambulatory Visit (INDEPENDENT_AMBULATORY_CARE_PROVIDER_SITE_OTHER): Admitting: Internal Medicine

## 2024-06-03 VITALS — BP 99/67 | HR 91 | Temp 98.0°F | Ht 66.0 in | Wt 203.0 lb

## 2024-06-03 DIAGNOSIS — Z6833 Body mass index (BMI) 33.0-33.9, adult: Secondary | ICD-10-CM

## 2024-06-03 DIAGNOSIS — R7303 Prediabetes: Secondary | ICD-10-CM

## 2024-06-03 DIAGNOSIS — R638 Other symptoms and signs concerning food and fluid intake: Secondary | ICD-10-CM

## 2024-06-03 DIAGNOSIS — E66811 Obesity, class 1: Secondary | ICD-10-CM | POA: Diagnosis not present

## 2024-06-03 MED ORDER — TOPIRAMATE 25 MG PO TABS: 25.0000 mg | ORAL_TABLET | Freq: Every evening | ORAL | 0 refills | Status: DC

## 2024-06-03 MED ORDER — PHENTERMINE HCL 37.5 MG PO TABS: 37.5000 mg | ORAL_TABLET | Freq: Every day | ORAL | 0 refills | Status: DC

## 2024-06-03 NOTE — Telephone Encounter (Signed)
 Requested medication (s) are due for refill today - yes  Requested medication (s) are on the active medication list -yes  Future visit scheduled -yes  Last refill: 04/24/24 #90  Notes to clinic: fails lab protocol- over 1 year-05/22/23  Requested Prescriptions  Pending Prescriptions Disp Refills   ibuprofen  (ADVIL ) 800 MG tablet [Pharmacy Med Name: IBUPROFEN  800 MG TABLET] 90 tablet 0    Sig: TAKE 1 TABLET BY MOUTH EVERY 8 HOURS AS NEEDED     Analgesics:  NSAIDS Failed - 06/03/2024 12:23 PM      Failed - Manual Review: Labs are only required if the patient has taken medication for more than 8 weeks.      Failed - HGB in normal range and within 360 days    Hemoglobin  Date Value Ref Range Status  05/22/2023 13.3 11.7 - 15.5 g/dL Final  98/73/7976 86.4 11.1 - 15.9 g/dL Final         Failed - PLT in normal range and within 360 days    Platelets  Date Value Ref Range Status  05/22/2023 121 (L) 140 - 400 Thousand/uL Final  12/02/2021 227 150 - 450 x10E3/uL Final         Failed - HCT in normal range and within 360 days    HCT  Date Value Ref Range Status  05/22/2023 40.5 35.0 - 45.0 % Final   Hematocrit  Date Value Ref Range Status  12/02/2021 39.5 34.0 - 46.6 % Final         Passed - Cr in normal range and within 360 days    Creat  Date Value Ref Range Status  05/22/2023 0.81 0.50 - 0.99 mg/dL Final   Creatinine, Ser  Date Value Ref Range Status  11/06/2023 0.74 0.57 - 1.00 mg/dL Final         Passed - eGFR is 30 or above and within 360 days    EGFR (African American)  Date Value Ref Range Status  05/26/2014 >60  Final   GFR calc Af Amer  Date Value Ref Range Status  01/24/2018 >60 >60 mL/min Final    Comment:    (NOTE) The eGFR has been calculated using the CKD EPI equation. This calculation has not been validated in all clinical situations. eGFR's persistently <60 mL/min signify possible Chronic Kidney Disease.    EGFR (Non-African Amer.)  Date Value  Ref Range Status  05/26/2014 >60  Final    Comment:    eGFR values <92mL/min/1.73 m2 may be an indication of chronic kidney disease (CKD). Calculated eGFR is useful in patients with stable renal function. The eGFR calculation will not be reliable in acutely ill patients when serum creatinine is changing rapidly. It is not useful in  patients on dialysis. The eGFR calculation may not be applicable to patients at the low and high extremes of body sizes, pregnant women, and vegetarians.    GFR calc non Af Amer  Date Value Ref Range Status  01/24/2018 >60 >60 mL/min Final   GFR  Date Value Ref Range Status  08/12/2016 80.81 >60.00 mL/min Final   eGFR  Date Value Ref Range Status  11/06/2023 102 >59 mL/min/1.73 Final         Passed - Patient is not pregnant      Passed - Valid encounter within last 12 months    Recent Outpatient Visits           3 months ago Rash and nonspecific skin eruption   [facility] [place], Angeline ORN, NP   5 months ago Encounter for general adult medical examination with abnormal findings   [place] [facility] [place], Angeline ORN, NP       Future Appointments             In 2 weeks Antonette, Angeline ORN, NP [place], [place]   In 3 months Hester Alm BROCKS, MD  [facility]               Requested Prescriptions  Pending Prescriptions Disp Refills   ibuprofen  (ADVIL ) 800 MG tablet [Pharmacy Med Name: IBUPROFEN  800 MG TABLET] 90 tablet 0    Sig: TAKE 1 TABLET BY MOUTH EVERY 8 HOURS AS NEEDED     Analgesics:  NSAIDS Failed - 06/03/2024 12:23 PM      Failed - Manual Review: Labs are only required if the patient has taken medication for more than 8 weeks.      Failed - HGB in normal range and within 360 days    Hemoglobin  Date Value Ref Range Status  05/22/2023 13.3 11.7 - 15.5 g/dL Final  98/73/7976 86.4 11.1 - 15.9 g/dL Final         Failed - PLT in  normal range and within 360 days    Platelets  Date Value Ref Range Status  05/22/2023 121 (L) 140 - 400 Thousand/uL Final  12/02/2021 227 150 - 450 x10E3/uL Final         Failed - HCT in normal range and within 360 days    HCT  Date Value Ref Range Status  05/22/2023 40.5 35.0 - 45.0 % Final   Hematocrit  Date Value Ref Range Status  12/02/2021 39.5 34.0 - 46.6 % Final         Passed - Cr in normal range and within 360 days    Creat  Date Value Ref Range Status  05/22/2023 0.81 0.50 - 0.99 mg/dL Final   Creatinine, Ser  Date Value Ref Range Status  11/06/2023 0.74 0.57 - 1.00 mg/dL Final         Passed - eGFR is 30 or above and within 360 days    EGFR (African American)  Date Value Ref Range Status  05/26/2014 >60  Final   GFR calc Af Amer  Date Value Ref Range Status  01/24/2018 >60 >60 mL/min Final    Comment:    (NOTE) The eGFR has been calculated using the CKD EPI equation. This calculation has not been validated in all clinical situations. eGFR's persistently <60 mL/min signify possible Chronic Kidney Disease.    EGFR (Non-African Amer.)  Date Value Ref Range Status  05/26/2014 >60  Final    Comment:    eGFR values <24mL/min/1.73 m2 may be an indication of chronic kidney disease (CKD). Calculated eGFR is useful in patients with stable renal function. The eGFR calculation will not be reliable in acutely ill patients when serum creatinine is changing rapidly. It is not useful in  patients on dialysis. The eGFR calculation may not be applicable to patients at the low and high extremes of body sizes, pregnant women, and vegetarians.    GFR calc non Af Amer  Date Value Ref Range Status  01/24/2018 >60 >60 mL/min Final   GFR  Date Value Ref Range Status  08/12/2016 80.81 >60.00 mL/min Final   eGFR  Date Value Ref Range Status  11/06/2023 102 >59 mL/min/1.73  Final         Passed - Patient is not pregnant      Passed - Valid encounter within last  12 months    Recent Outpatient Visits           3 months ago Rash and nonspecific skin eruption   [place] [facility] [place], Angeline ORN, NP   5 months ago Encounter for general adult medical examination with abnormal findings   [place] [facility] [place], Angeline ORN, NP       Future Appointments             In 2 weeks Antonette, Angeline ORN, NP [place] [facility], [place]   In 3 months Hester Alm BROCKS, MD [facility]

## 2024-06-03 NOTE — Progress Notes (Unsigned)
 Office: [phone]  /  Fax: [phone]  Weight Summary and Body Composition Analysis (BIA)  Vitals Temp: 98 F (36.7 C) BP: 99/67 Pulse Rate: 91 SpO2: 98 %   Anthropometric Measurements Height: 5' 6 (1.676 m) Weight: 203 lb (92.1 kg) BMI (Calculated): 32.78 Weight at Last Visit: 204 lb Weight Lost Since Last Visit: 1 lb Weight Gained Since Last Visit: 0 lb Starting Weight: 217 lb Total Weight Loss (lbs): 14 lb (6.35 kg) Peak Weight: 226 lb   Body Composition  Body Fat %: 42.1 % Fat Mass (lbs): 85.4 lbs Muscle Mass (lbs): 111.6 lbs Total Body Water  (lbs): 77.8 lbs Visceral Fat Rating : 10    RMR: 1987  Today's Visit #: 8  Starting Date: 11/24/22   Subjective   Chief Complaint: Obesity  Interval History Discussed the use of AI scribe software for clinical note transcription with the patient, who gave verbal consent to proceed.  History of Present Illness   Martha Page is a 43 year old female who presents for medical weight management.  She is adhering to a 1200 calorie nutrition plan approximately 70-80% of the time and has achieved a total weight loss of 14 pounds, with a one-pound loss since her last visit. She notes that some of her clothes are fitting looser, indicating a change in body size.  She is currently taking phentermine  and topiramate  separately, with topiramate  administered in the evening. She reports that certain foods taste 'nasty' since starting topiramate , which has helped her avoid certain candies. Her appetite has decreased, allowing her to delay her first meal until 10 AM and her lunch until around 2 PM. She has also reduced her snacking habits.  She continues to take metformin  for diabetes prevention. No palpitations or side effects to phentermine       Challenges affecting patient progress: Gradual weight loss.    Pharmacotherapy for weight management: She is currently taking Metformin  (off label use for incretin effect and  / or insulin  resistance and / or diabetes prevention) with adequate clinical response  and without side effects., Topiramate  (off label use, single agent) with adequate clinical response  and without side effects., and Phentermine  (longterm use, single agent)  with adequate clinical response  and without side effects..   Assessment and Plan   Treatment Plan For Obesity:  Recommended Dietary Goals  Martha Page is currently in the action stage of change. As such, her goal is to continue weight management plan. She has agreed to: incorporate 1-2 meal replacements a day for convenience , continue current plan, and incorporate time restricted eating 16 hours fasting with an 8 hour eating window while maintaining reduced calorie nutrition plan  Behavioral Health and Counseling  We discussed the following behavioral modification strategies today: continue to work on maintaining a reduced calorie state, getting the recommended amount of protein, incorporating whole foods, making healthy choices, staying well hydrated and practicing mindfulness when eating..  Additional education and resources provided today: None  Recommended Physical Activity Goals  Martha Page has been advised to work up to 150 minutes of moderate intensity aerobic activity a week and strengthening exercises 2-3 times per week for cardiovascular health, weight loss maintenance and preservation of muscle mass.   She has agreed to :  continue to gradually increase the amount and intensity of exercise routine  Medical Interventions and Pharmacotherapy  We discussed various medication options to help Martha Page with her weight loss efforts and we both agreed to : Increase phentermine  to 37.5 milligramscontinue  topiramate  at 25 mg in the evening.  Continue metformin  for diabetes prevention and weight management.  She does not have coverage for GLP-1.  Associated Conditions Impacted by Obesity Treatment  Assessment & Plan Class 1 obesity with  serious comorbidity and body mass index (BMI) of 33.0 to 33.9 in adult, unspecified obesity type Undergoing medical weight management with a total weight loss of 14 pounds. Adhering to a 1200 calorie nutrition plan 70-80% of the time. 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 Reports decreased appetite and improved meal spacing with topiramate . Clothes fitting looser, indicating weight loss. No palpitations or tachycardia, and blood pressure is 99/67 mmHg with a pulse rate of 91 bpm. Discussed intermittent fasting (16:8) as a complementary strategy, emphasizing not skipping meals to avoid slowing metabolism. Considered potential cost benefits of Zepbound  for weight management. - Increase phentermine  to 37.5 mg daily. - Continue topiramate  at 25 mg in the evening. - Consider increasing topiramate  to 50 mg if needed. - Continue 1200 calorie nutrition plan. - Implement 16:8 intermittent fasting with an eating window from 10 AM to 6 PM. - Consider protein bars or shakes with fruit as meal replacements . - [facility] [place]. Prediabetes Most recent A1c is  Lab Results  Component Value Date   HGBA1C 5.7 (H) 10/25/2023   HGBA1C 5.3 10/16/2015    Patient aware of disease state and risk of progression. This may contribute to abnormal cravings, fatigue and diabetic complications without having diabetes.   We have discussed treatment options which include: losing 7 to 10% of body weight, increasing physical activity to a goal of 150 minutes a week at moderate intensity.  Advised to maintain a diet low on simple and processed carbohydrates.  Her insurance denied Zepbound  for pharmacoprophylaxis.  She has now metformin  XR 500 mg twice a day without any adverse effects.  She will continue current management  Check disease monitoring labs at the next office visit.  Abnormal food appetite Improving on phentermine  topiramate  off label.  She has increased  orexigenic signaling, impaired satiety and inhibitory control. This is secondary to an abnormal energy regulation system and pathological neurohormonal pathways characteristic of excess adiposity.  In addition to nutritional and behavioral strategies she benefits from pharmacotherapy.  Increase phentermine  to 37.5 mg once a day.  Continue topiramate  25 mg in the evening     General Health Maintenance Discussed need for contraception while on topiramate  due to teratogenic effects.        Objective   Physical Exam:  Blood pressure 99/67, pulse 91, temperature 98 F (36.7 C), height 5' 6 (1.676 m), weight 203 lb (92.1 kg), SpO2 98%. Body mass index is 32.77 kg/m.  General: She is overweight, cooperative, alert, well developed, and in no acute distress. PSYCH: Has normal mood, affect and thought process.   HEENT: EOMI, sclerae are anicteric. Lungs: Normal breathing effort, no conversational dyspnea. Extremities: No edema.  Neurologic: No gross sensory or motor deficits. No tremors or fasciculations noted.    Diagnostic Data Reviewed:  BMET    Component Value Date/Time   NA 137 11/06/2023 1328   NA 137 05/26/2014 1855   K 4.4 11/06/2023 1328   K 3.3 (L) 05/26/2014 1855   CL 103 11/06/2023 1328   CL 105 05/26/2014 1855   CO2 21 11/06/2023 1328   CO2 20 (L) 05/26/2014 1855   GLUCOSE 81 11/06/2023 1328   GLUCOSE 92 05/22/2023 1015   GLUCOSE 131 (H) 05/26/2014  1855   BUN 13 11/06/2023 1328   BUN 5 (L) 05/26/2014 1855   CREATININE 0.74 11/06/2023 1328   CREATININE 0.81 05/22/2023 1015   CALCIUM  9.2 11/06/2023 1328   CALCIUM  8.4 (L) 05/26/2014 1855   GFRNONAA >60 01/24/2018 1543   GFRNONAA >60 05/26/2014 1855   GFRAA >60 01/24/2018 1543   GFRAA >60 05/26/2014 1855   Lab Results  Component Value Date   HGBA1C 5.7 (H) 10/25/2023   HGBA1C 5.3 10/16/2015   Lab Results  Component Value Date   INSULIN  11.0 10/25/2023   Lab Results  Component Value Date   TSH 1.44  08/24/2022   CBC    Component Value Date/Time   WBC 8.6 05/22/2023 1015   RBC 4.65 05/22/2023 1015   HGB 13.3 05/22/2023 1015   HGB 13.5 12/02/2021 1557   HCT 40.5 05/22/2023 1015   HCT 39.5 12/02/2021 1557   PLT 121 (L) 05/22/2023 1015   PLT 227 12/02/2021 1557   MCV 87.1 05/22/2023 1015   MCV 83 12/02/2021 1557   MCV 87 05/26/2014 1855   MCH 28.6 05/22/2023 1015   MCHC 32.8 05/22/2023 1015   RDW 13.1 05/22/2023 1015   RDW 13.5 12/02/2021 1557   RDW 18.2 (H) 05/26/2014 1855   Iron Studies    Component Value Date/Time   IRON 63 11/06/2023 1328   TIBC 276 11/06/2023 1328   FERRITIN 14 (L) 11/06/2023 1328   IRONPCTSAT 23 11/06/2023 1328   Lipid Panel     Component Value Date/Time   CHOL 165 05/22/2023 1015   TRIG 114 05/22/2023 1015   HDL 50 05/22/2023 1015   CHOLHDL 3.3 05/22/2023 1015   VLDL 13.4 10/16/2015 0910   LDLCALC 94 05/22/2023 1015   Hepatic Function Panel     Component Value Date/Time   PROT 7.0 11/06/2023 1328   PROT 7.5 12/02/2013 1017   ALBUMIN 4.2 11/06/2023 1328   ALBUMIN 3.4 12/02/2013 1017   AST 29 11/06/2023 1328   AST 21 12/02/2013 1017   ALT 35 (H) 11/06/2023 1328   ALT 49 12/02/2013 1017   ALKPHOS 133 (H) 11/06/2023 1328   ALKPHOS 90 12/02/2013 1017   BILITOT 0.3 11/06/2023 1328   BILITOT 0.3 12/02/2013 1017   BILIDIR 0.1 01/27/2017 0844      Component Value Date/Time   TSH 1.44 08/24/2022 1123   Nutritional Lab Results  Component Value Date   VD25OH 19.0 (L) 10/25/2023   VD25OH 27 (L) 08/24/2022   VD25OH 19.16 (L) 01/27/2017    Medications: Outpatient Encounter Medications as of 06/03/2024  Medication Sig   cetirizine  (ZYRTEC ) 10 MG tablet TAKE 1 TABLET BY MOUTH EVERY DAY   ibuprofen  (ADVIL ) 800 MG tablet TAKE 1 TABLET BY MOUTH EVERY 8 HOURS AS NEEDED   metFORMIN  (GLUCOPHAGE -XR) 500 MG 24 hr tablet Take 1 tablet (500 mg total) by mouth 2 (two) times daily with a meal.   MIRENA, 52 MG, 20 MCG/24HR IUD    pantoprazole   (PROTONIX ) 40 MG tablet Take 1 tablet (40 mg total) by mouth 2 (two) times daily.   SUMAtriptan  (IMITREX ) 100 MG tablet TAKE 1 TABLET (100 MG TOTAL) BY MOUTH EVERY 2 (TWO) HOURS AS NEEDED FOR MIGRAINE. MAY REPEAT IN 2 HOURS IF HEADACHE PERSISTS OR RECURS.   traZODone  (DESYREL ) 50 MG tablet Take 100 mg by mouth at bedtime as needed.   venlafaxine  XR (EFFEXOR -XR) 150 MG 24 hr capsule Take 1 capsule (150 mg total) by mouth daily with breakfast.   [  DISCONTINUED] phentermine  (ADIPEX-P ) 37.5 MG tablet Take 0.5 tablets (18.75 mg total) by mouth daily before breakfast.   [DISCONTINUED] topiramate  (TOPAMAX ) 25 MG tablet Take 1 tablet (25 mg total) by mouth at bedtime.   phentermine  (ADIPEX-P ) 37.5 MG tablet Take 1 tablet (37.5 mg total) by mouth daily before breakfast.   topiramate  (TOPAMAX ) 25 MG tablet Take 1 tablet (25 mg total) by mouth at bedtime.   [DISCONTINUED] ibuprofen  (ADVIL ) 800 MG tablet TAKE 1 TABLET BY MOUTH EVERY 8 HOURS AS NEEDED   No facility-administered encounter medications on file as of 06/03/2024.     Follow-Up   No follow-ups on file.Martha Page She was informed of the importance of frequent follow up visits to maximize her success with intensive lifestyle modifications for her multiple health conditions.  Attestation Statement   Reviewed by clinician on day of visit: allergies, medications, problem list, medical history, surgical history, family history, social history, and previous encounter notes.     Lucas Parker, MD

## 2024-06-04 NOTE — Assessment & Plan Note (Signed)
 Most recent A1c is  Lab Results  Component Value Date   HGBA1C 5.7 (H) 10/25/2023   HGBA1C 5.3 10/16/2015    Patient aware of disease state and risk of progression. This may contribute to abnormal cravings, fatigue and diabetic complications without having diabetes.   We have discussed treatment options which include: losing 7 to 10% of body weight, increasing physical activity to a goal of 150 minutes a week at moderate intensity.  Advised to maintain a diet low on simple and processed carbohydrates.  Her insurance denied Zepbound  for pharmacoprophylaxis.  She has now metformin  XR 500 mg twice a day without any adverse effects.  She will continue current management  Check disease monitoring labs at the next office visit.

## 2024-06-04 NOTE — Assessment & Plan Note (Signed)
 Improving on phentermine  topiramate  off label.  She has increased orexigenic signaling, impaired satiety and inhibitory control. This is secondary to an abnormal energy regulation system and pathological neurohormonal pathways characteristic of excess adiposity.  In addition to nutritional and behavioral strategies she benefits from pharmacotherapy.  Increase phentermine  to 37.5 mg once a day.  Continue topiramate  25 mg in the evening

## 2024-06-04 NOTE — Assessment & Plan Note (Signed)
 Undergoing medical weight management with a total weight loss of 14 pounds. Adhering to a 1200 calorie nutrition plan 70-80% of the time. 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 Reports decreased appetite and improved meal spacing with topiramate . Clothes fitting looser, indicating weight loss. No palpitations or tachycardia, and blood pressure is 99/67 mmHg with a pulse rate of 91 bpm. Discussed intermittent fasting (16:8) as a complementary strategy, emphasizing not skipping meals to avoid slowing metabolism. Considered potential cost benefits of Zepbound  for weight management. - Increase phentermine  to 37.5 mg daily. - Continue topiramate  at 25 mg in the evening. - Consider increasing topiramate  to 50 mg if needed. - Continue 1200 calorie nutrition plan. - Implement 16:8 intermittent fasting with an eating window from 10 AM to 6 PM. - Consider protein bars or shakes with fruit as meal replacements . - [facility] [place].

## 2024-06-13 NOTE — Progress Notes (Deleted)
 Martha Page

## 2024-06-14 ENCOUNTER — Telehealth: Payer: Self-pay | Admitting: Neurology

## 2024-06-14 NOTE — Telephone Encounter (Signed)
 Pt called to cancel appt due to not having anymore headaches   Appt Canceled

## 2024-06-17 ENCOUNTER — Ambulatory Visit: Admitting: Neurology

## 2024-06-21 ENCOUNTER — Ambulatory Visit: Payer: 59 | Admitting: Internal Medicine

## 2024-06-21 ENCOUNTER — Encounter: Payer: Self-pay | Admitting: Internal Medicine

## 2024-06-21 VITALS — BP 104/68 | Ht 66.0 in | Wt 204.0 lb

## 2024-06-21 DIAGNOSIS — G4733 Obstructive sleep apnea (adult) (pediatric): Secondary | ICD-10-CM

## 2024-06-21 DIAGNOSIS — D696 Thrombocytopenia, unspecified: Secondary | ICD-10-CM

## 2024-06-21 DIAGNOSIS — K1379 Other lesions of oral mucosa: Secondary | ICD-10-CM

## 2024-06-21 DIAGNOSIS — R61 Generalized hyperhidrosis: Secondary | ICD-10-CM

## 2024-06-21 DIAGNOSIS — G8929 Other chronic pain: Secondary | ICD-10-CM

## 2024-06-21 DIAGNOSIS — N898 Other specified noninflammatory disorders of vagina: Secondary | ICD-10-CM

## 2024-06-21 DIAGNOSIS — E6609 Other obesity due to excess calories: Secondary | ICD-10-CM

## 2024-06-21 DIAGNOSIS — F32A Depression, unspecified: Secondary | ICD-10-CM

## 2024-06-21 DIAGNOSIS — K219 Gastro-esophageal reflux disease without esophagitis: Secondary | ICD-10-CM

## 2024-06-21 DIAGNOSIS — F5104 Psychophysiologic insomnia: Secondary | ICD-10-CM

## 2024-06-21 DIAGNOSIS — N912 Amenorrhea, unspecified: Secondary | ICD-10-CM

## 2024-06-21 DIAGNOSIS — E538 Deficiency of other specified B group vitamins: Secondary | ICD-10-CM

## 2024-06-21 DIAGNOSIS — R7303 Prediabetes: Secondary | ICD-10-CM | POA: Diagnosis not present

## 2024-06-21 DIAGNOSIS — F419 Anxiety disorder, unspecified: Secondary | ICD-10-CM

## 2024-06-21 DIAGNOSIS — R6889 Other general symptoms and signs: Secondary | ICD-10-CM

## 2024-06-21 DIAGNOSIS — R4189 Other symptoms and signs involving cognitive functions and awareness: Secondary | ICD-10-CM

## 2024-06-21 DIAGNOSIS — E559 Vitamin D deficiency, unspecified: Secondary | ICD-10-CM

## 2024-06-21 DIAGNOSIS — G90A Postural orthostatic tachycardia syndrome (POTS): Secondary | ICD-10-CM

## 2024-06-21 DIAGNOSIS — M722 Plantar fascial fibromatosis: Secondary | ICD-10-CM

## 2024-06-21 DIAGNOSIS — G44021 Chronic cluster headache, intractable: Secondary | ICD-10-CM

## 2024-06-21 DIAGNOSIS — E7800 Pure hypercholesterolemia, unspecified: Secondary | ICD-10-CM

## 2024-06-21 DIAGNOSIS — K5909 Other constipation: Secondary | ICD-10-CM

## 2024-06-21 MED ORDER — PANTOPRAZOLE SODIUM 40 MG PO TBEC: 40.0000 mg | DELAYED_RELEASE_TABLET | Freq: Two times a day (BID) | ORAL | 1 refills | Status: AC

## 2024-06-21 MED ORDER — LINZESS 145 MCG PO CAPS: 145.0000 ug | ORAL_CAPSULE | Freq: Every day | ORAL | 1 refills | Status: AC

## 2024-06-21 NOTE — Progress Notes (Signed)
 Subjective:    Patient ID: Martha Page, female    DOB: Aug 11, 1981, 43 y.o.   MRN: [id number]  HPI  Pt presents to the clinic today for follow up chronic conditions.  Cluster headaches: These occur 1-2 times per week, which has been worse lately which she relates to perimenopause and reduction in caffeine.  She is not sure what triggers this.  She takes topiramate  as prescribed and ibuprofen  or sumatriptan  as needed with minimal relief of symptoms.  She does not follow with neurology.  GERD: She is not sure what triggers this.  She denies breakthrough on pantoprazole .  Upper GI from 07/2016 reviewed.  OSA: She averages 7 hours of sleep per night with use of her CPAP.  Sleep study from 05/2019 reviewed.  Oral ulcers: Triggered by acidic foods.  She does not take any medication for this.  POTS: She reports 2 episodes in the last 6 months of near syncope.  She is not taking any medications for this at this time.  She has seen cardiology and neurology in the past for this but does not currently follow with them.  Anxiety and depression: Chronic, managed on venlafaxine  as prescribed.  She is no longer taking bupropion and lorazepam.  She is not currently seeing a therapist.  She denies SI/HI.  Insomnia: She has difficulty falling asleep.  She takes trazodone  as prescribed.  Sleep study from 05/2019 reviewed.  Prediabetes: Her last A1c was 5.7%, 10/2023.  She is taking metformin  as prescribed.  She does not check her sugars.  Chronic constipation: She is no longer taking docusate but is taking linzess  as needed.  Colonoscopy from 01/2022 reviewed.  HLD: Her last LDL was 94, triglycerides 885, 05/2023.  She is not taking any cholesterol-lowering medication at this time.  She tries to consume low-fat diet.  Thrombocytopenia: Her last platelet count was 121, 05/2023.  She does not follow with hematology.  Chronic hip pain: Bilateral due to bursitis however she reports she is about to have a left  hip replacement, scheduled 08/14/24. She had a left hip injection with minimal relief of symptoms. She is taking ibuprofen  800 mg 2-3 times a day with minimal relief of symptoms. She is currently doing PT at home. She follows with orthopedics.  Plantar fasciitis: Worse in her right foot. She has had an injection in the past. She is taking ibuprofen  800 mg 2-3 times a day.  She follows with podiatry.  Review of Systems     Past Medical History:  Diagnosis Date   Acne    iPledge: [phone]   Anxiety    Bursitis of right hip    Chronic constipation    Cluster headaches    Depression    Dermatitis    eczema   Elevated liver enzymes    Frequent headaches    GERD (gastroesophageal reflux disease)    Hx of dysplastic nevus 04/02/2013   Left distal knee. Mild atypia, margins free   Insomnia    Left knee pain    Migraine    2x/6 mos   Obesity    OSA (obstructive sleep apnea)    Osteoarthritis    Overactive bladder    Pneumonia 06/30/2016   has finished antibiotics.  still has lingering nighttime cough   POTS (postural orthostatic tachycardia syndrome)    Recurrent oral ulcers    Shoulder pain    Sleep apnea    uses CPAP   Vitamin B 12 deficiency    Vitamin  D deficiency     Current Outpatient Medications  Medication Sig Dispense Refill   cetirizine  (ZYRTEC ) 10 MG tablet TAKE 1 TABLET BY MOUTH EVERY DAY 90 tablet 0   ibuprofen  (ADVIL ) 800 MG tablet TAKE 1 TABLET BY MOUTH EVERY 8 HOURS AS NEEDED 90 tablet 0   metFORMIN  (GLUCOPHAGE -XR) 500 MG 24 hr tablet Take 1 tablet (500 mg total) by mouth 2 (two) times daily with a meal. 180 tablet 0   MIRENA, 52 MG, 20 MCG/24HR IUD      pantoprazole  (PROTONIX ) 40 MG tablet Take 1 tablet (40 mg total) by mouth 2 (two) times daily. 180 tablet 0   phentermine  (ADIPEX-P ) 37.5 MG tablet Take 1 tablet (37.5 mg total) by mouth daily before breakfast. 30 tablet 0   SUMAtriptan  (IMITREX ) 100 MG tablet TAKE 1 TABLET (100 MG TOTAL) BY MOUTH EVERY 2  (TWO) HOURS AS NEEDED FOR MIGRAINE. MAY REPEAT IN 2 HOURS IF HEADACHE PERSISTS OR RECURS. 10 tablet 1   topiramate  (TOPAMAX ) 25 MG tablet Take 1 tablet (25 mg total) by mouth at bedtime. 30 tablet 0   traZODone  (DESYREL ) 50 MG tablet Take 100 mg by mouth at bedtime as needed.     venlafaxine  XR (EFFEXOR -XR) 150 MG 24 hr capsule Take 1 capsule (150 mg total) by mouth daily with breakfast. 90 capsule 1   No current facility-administered medications for this visit.    Allergies  Allergen Reactions   Cefazolin Rash and Shortness Of Breath   Codeine Sulfate Itching   Red Dye #40 (Allura Red) Hives   Sulfa Antibiotics Other (See Comments)    Stomach pain   Amoxicillin Rash   Keflex [Cephalexin] Swelling and Rash   Penicillin V Potassium Rash    Family History  Problem Relation Age of Onset   Stroke Mother    Depression Mother    Interstitial cystitis Mother    Fibromyalgia Mother    Diabetes Mother    Post-traumatic stress disorder Mother    Anxiety disorder Mother    Obesity Mother    Heart disease Father    Throat cancer Father    Hypertension Father    High Cholesterol Father    Depression Father    Alcoholism Father    Healthy Sister    Aortic aneurysm Brother 79   Arthritis Paternal Grandmother    Lung cancer Paternal Grandmother    Heart disease Paternal Grandmother    Stroke Paternal Grandmother    Hypertension Paternal Grandmother    Breast cancer Neg Hx     Social History   Socioeconomic History   Marital status: Married    Spouse name: Reyes   Number of children: 6   Years of education: Not on file   Highest education level: Associate degree: occupational, Scientist, product/process development, or vocational program  Occupational History   Occupation: Admin Assist  Tobacco Use   Smoking status: Former    Current packs/day: 0.00    Types: Cigarettes    Quit date: 07/01/2011    Years since quitting: 12.9    Passive exposure: Past   Smokeless tobacco: Never  Vaping Use    Vaping status: Never Used  Substance and Sexual Activity   Alcohol use: No    Alcohol/week: 0.0 standard drinks of alcohol   Drug use: No   Sexual activity: Yes    Birth control/protection: I.U.D.  Other Topics Concern   Not on file  Social History Narrative   Married.   6 children.   Works  as a homemaker.      Social Drivers of Corporate investment banker Strain: Low Risk  (06/17/2024)   Overall Financial Resource Strain (CARDIA)    Difficulty of Paying Living Expenses: Not hard at all  Food Insecurity: No Food Insecurity (06/17/2024)   Hunger Vital Sign    Worried About Running Out of Food in the Last Year: Never true    Ran Out of Food in the Last Year: Never true  Transportation Needs: No Transportation Needs (06/17/2024)   PRAPARE - Administrator, Civil Service (Medical): No    Lack of Transportation (Non-Medical): No  Physical Activity: Insufficiently Active (06/17/2024)   Exercise Vital Sign    Days of Exercise per Week: 3 days    Minutes of Exercise per Session: 30 min  Stress: No Stress Concern Present (06/17/2024)   [employer] of Occupational Health - Occupational Stress Questionnaire    Feeling of Stress: Only a little  Social Connections: Socially Integrated (06/17/2024)   Social Connection and Isolation Panel    Frequency of Communication with Friends and Family: More than three times a week    Frequency of Social Gatherings with Friends and Family: Twice a week    Attends Religious Services: More than 4 times per year    Active Member of [employer] or Organizations: Yes    Attends Engineer, structural: More than 4 times per year    Marital Status: Married  Catering manager Violence: Not on file     Constitutional: Patient reports intermittent headaches.  Denies fever, malaise, fatigue, or abrupt weight changes.  HEENT: Pt reports alteration in sense of smell, itchy ears. Denies eye pain, eye redness, ear pain, ringing in the ears, wax  buildup, runny nose, nasal congestion, bloody nose, or sore throat. Respiratory: Denies difficulty breathing, shortness of breath, cough or sputum production.   Cardiovascular: Denies chest pain, chest tightness, palpitations or swelling in the hands or feet.  Gastrointestinal: Patient reports chronic constipation.  Denies abdominal pain, bloating, diarrhea or blood in the stool.  GU: Pt reports amenorrhea (has IUD), decreased libido, vaginal dryness, nocturia. Denies urgency, frequency, pain with urination, burning sensation, blood in urine, odor or discharge. Musculoskeletal: Pt reports bilateral hip pain, foot pain. Denies decrease in range of motion, difficulty with gait, muscle pain or joint swelling.  Skin: Denies redness, rashes, lesions or ulcercations.  Neurological: Pt reports intermittent dizziness, hot flashes, insomnia and brain fog. Denies dizziness, difficulty with speech or problems with balance and coordination.  Psych: Patient has a history of anxiety and depression.  Denies SI/HI.  No other specific complaints in a complete review of systems (except as listed in HPI above).  Objective:   Physical Exam BP 104/68 (BP Location: Left Arm, Patient Position: Sitting, Cuff Size: Normal)   Ht 5' 6 (1.676 m)   Wt 204 lb (92.5 kg)   LMP  (LMP Unknown)   BMI 32.93 kg/m    Wt Readings from Last 3 Encounters:  06/03/24 203 lb (92.1 kg)  04/30/24 204 lb (92.5 kg)  03/05/24 209 lb (94.8 kg)    General: Appears her stated age, obese, in NAD. Skin: Warm, dry and intact. HEENT: Head: normal shape and size; Eyes: sclera white, no icterus, conjunctiva pink, PERRLA and EOMs intact;  Cardiovascular: Normal rate and rhythm. S1,S2 noted.  No murmur, rubs or gallops noted. No JVD or BLE edema.  Pulmonary/Chest: Normal effort and positive vesicular breath sounds. No respiratory distress. No wheezes,  rales or ronchi noted.  Abdomen: Normal bowel sounds.  Musculoskeletal:  No difficulty  with gait.  Neurological: Alert and oriented.  Coordination normal.  Psychiatric: Mood and affect normal. Behavior is normal. Judgment and thought content normal.     BMET    Component Value Date/Time   NA 137 11/06/2023 1328   NA 137 05/26/2014 1855   K 4.4 11/06/2023 1328   K 3.3 (L) 05/26/2014 1855   CL 103 11/06/2023 1328   CL 105 05/26/2014 1855   CO2 21 11/06/2023 1328   CO2 20 (L) 05/26/2014 1855   GLUCOSE 81 11/06/2023 1328   GLUCOSE 92 05/22/2023 1015   GLUCOSE 131 (H) 05/26/2014 1855   BUN 13 11/06/2023 1328   BUN 5 (L) 05/26/2014 1855   CREATININE 0.74 11/06/2023 1328   CREATININE 0.81 05/22/2023 1015   CALCIUM  9.2 11/06/2023 1328   CALCIUM  8.4 (L) 05/26/2014 1855   GFRNONAA >60 01/24/2018 1543   GFRNONAA >60 05/26/2014 1855   GFRAA >60 01/24/2018 1543   GFRAA >60 05/26/2014 1855    Lipid Panel     Component Value Date/Time   CHOL 165 05/22/2023 1015   TRIG 114 05/22/2023 1015   HDL 50 05/22/2023 1015   CHOLHDL 3.3 05/22/2023 1015   VLDL 13.4 10/16/2015 0910   LDLCALC 94 05/22/2023 1015    CBC    Component Value Date/Time   WBC 8.6 05/22/2023 1015   RBC 4.65 05/22/2023 1015   HGB 13.3 05/22/2023 1015   HGB 13.5 12/02/2021 1557   HCT 40.5 05/22/2023 1015   HCT 39.5 12/02/2021 1557   PLT 121 (L) 05/22/2023 1015   PLT 227 12/02/2021 1557   MCV 87.1 05/22/2023 1015   MCV 83 12/02/2021 1557   MCV 87 05/26/2014 1855   MCH 28.6 05/22/2023 1015   MCHC 32.8 05/22/2023 1015   RDW 13.1 05/22/2023 1015   RDW 13.5 12/02/2021 1557   RDW 18.2 (H) 05/26/2014 1855   LYMPHSABS 2.4 12/02/2021 1557   MONOABS 0.4 04/14/2015 1644   EOSABS 0.0 12/02/2021 1557   BASOSABS 0.0 12/02/2021 1557    Hgb A1C Lab Results  Component Value Date   HGBA1C 5.7 (H) 10/25/2023           Assessment & Plan:   Hot flashes, night sweats, brain fog, vaginal dryness and cold intolerance:  Symptoms of perimenopause She is requesting TSH, free T4, T3, prolactin,  FSH/LH, progesterone , ferritin, testosterone, vitamin D  and B12 Will discuss further once labs have resulted   RTC in 6 months for your annual exam Angeline Laura, NP

## 2024-06-22 ENCOUNTER — Ambulatory Visit: Payer: Self-pay | Admitting: Internal Medicine

## 2024-06-22 ENCOUNTER — Encounter: Payer: Self-pay | Admitting: Internal Medicine

## 2024-06-22 DIAGNOSIS — D696 Thrombocytopenia, unspecified: Secondary | ICD-10-CM | POA: Insufficient documentation

## 2024-06-22 DIAGNOSIS — G8929 Other chronic pain: Secondary | ICD-10-CM | POA: Insufficient documentation

## 2024-06-22 DIAGNOSIS — G90A Postural orthostatic tachycardia syndrome (POTS): Secondary | ICD-10-CM | POA: Insufficient documentation

## 2024-06-22 NOTE — Assessment & Plan Note (Signed)
 Avoid foods that trigger reflux Encourage weight loss as this can help reduce reflux symptoms Continue pantoprazole  40 mg twice daily, she is hesitant to decrease at this time

## 2024-06-22 NOTE — Assessment & Plan Note (Signed)
 Encourage diet and exercise for weight loss We will continue metformin  500 mg XR, topamax  25 mg daily and phentermine  37.5 mg daily per weight management

## 2024-06-22 NOTE — Assessment & Plan Note (Signed)
 She will continue ibuprofen  OTC as needed She will continue to follow with podiatry

## 2024-06-22 NOTE — Assessment & Plan Note (Signed)
 CBC today.

## 2024-06-22 NOTE — Assessment & Plan Note (Addendum)
 Continue topiramate  25 mg at bedtime Try to identify and avoid triggers Continue ibuprofen  and sumatriptan  as needed

## 2024-06-22 NOTE — Assessment & Plan Note (Signed)
 Stable on venlafaxine  150 mg daily Support offered

## 2024-06-22 NOTE — Patient Instructions (Signed)
 Perimenopause: What to Know Perimenopause is the time in your life when your levels of estrogen start to go down. Estrogen is the female hormone made by your ovaries. Perimenopause can start 2-8 years before menopause. It can cause changes to your menstrual period. During this time, your ovaries may or may not make an egg. In many cases, you can still get pregnant. What are the causes? Perimenopause is a natural change in your homone levels that happens as you get older. What increases the risk? You're more likely to start perimenopause early if: You have an abnormal growth (tumor) of the pituitary gland in your brain. You have a disease that affects your ovaries. You've had certain treatments for cancer. These include: Chemotherapy. Hormone therapy. Radiation therapy on the area between your hips (pelvis). You smoke a lot or drink a lot of alcohol. Other family members have gone through menopause early. What are the signs or symptoms? Symptoms are unique to each person. You may have: Hot flashes. Irregular periods. Night sweats. Changes in how you feel about sex. You may have less of a sex drive or feel more discomfort around your sexuality. Vaginal dryness. Headaches. Mood swings. Other symptoms may include: Depression. This is when you feel sad or hopeless. Trouble sleeping. Memory problems or trouble focusing. Irritability. This means getting annoyed easily. Tiredness. Weight gain. Anxiety. This is feeling worried or nervous. You can also have trouble getting pregnant. How is this diagnosed? You may be diagnosed based on: Your medical history. An exam. Your age. Your history of menstrual periods. Your symptoms. Hormone tests. How is this treated? In some cases, no treatment is needed. Talk with your health care provider about if you should get treated. Treatments may include: Menopausal hormone therapy (MHT). Medicines to treat certain  symptoms. Acupuncture. Vitamin or herbal supplements. Before you start treatment, let your provider know if you or anyone in your family has or has had: Heart disease. Breast cancer. Blood clots. Diabetes. Osteoporosis. Follow these instructions at home: Eating and drinking  Eat a balanced diet. It should include: Fresh fruits and vegetables. Whole grains. Soybeans. Eggs. Lean meat. Low-fat dairy. To help prevent hot flashes, stay away from: Alcohol. Drinks with caffeine in them. Spicy foods. Lifestyle Do not smoke, vape, or use nicotine or tobacco. Get at least 30 minutes of physical activity on 5 or more days each week. Get 7-8 hours of sleep each night. Dress in layers that can be taken off if you have a hot flash. Find ways to manage stress. You may want to try: Deep breathing. Meditation. Writing in a journal. General instructions  Take your medicines only as told. Keep track of your periods. Track: When they happen. How heavy they are. How long they last. How much time passes between periods. Keep track of your symptoms. Track: When they start. How often you have them. How long they last. Use vaginal lubricants or moisturizers. These can help with: Vaginal dryness. Comfort during sex. You can still get pregnant if you're having any periods. Make sure you use birth control if you don't want to get pregnant. Contact a health care provider if: You have a very heavy period or pass blood clots. Your period lasts more than 2 days longer than normal. Your period comes back sooner than 21 days. You bleed after having sex. You have pain during sex. You have pain when you pee. You get very bad headaches. You have trouble with your eyesight. Get help right away if: You  have chest pain. You have trouble breathing. You have trouble talking. You have very bad depression. This information is not intended to replace advice given to you by your health care provider.  Make sure you discuss any questions you have with your health care provider. Document Revised: 06/29/2023 Document Reviewed: 06/29/2023 Elsevier Patient Education  2024 [employer].

## 2024-06-22 NOTE — Assessment & Plan Note (Signed)
 Not medicated at this time We will monitor

## 2024-06-22 NOTE — Assessment & Plan Note (Signed)
 Continue trazodone  50 mg nightly as needed

## 2024-06-22 NOTE — Assessment & Plan Note (Signed)
 Not medicated We will monitor

## 2024-06-22 NOTE — Assessment & Plan Note (Signed)
Encourage weight loss as this can help reduce sleep apnea symptoms Encouraged regular use of CPAP

## 2024-06-22 NOTE — Assessment & Plan Note (Signed)
 A1c today Encourage low-carb diet and exercise for weight loss

## 2024-06-22 NOTE — Assessment & Plan Note (Signed)
 C-Met and lipid profile today Encouraged her to consume a low-fat diet

## 2024-06-22 NOTE — Assessment & Plan Note (Signed)
 Encouraged high-fiber diet and adequate water  intake She was requesting increase in Linzess  dose however I recommended that she take linzess  145 mcg daily instead of 2-3 times a week

## 2024-06-22 NOTE — Assessment & Plan Note (Signed)
 Encourage weight loss as this could help reduce hip pain She is scheduled for left hip replacement Continue ibuprofen  OTC as needed

## 2024-06-26 LAB — PROLACTIN: Prolactin: 10.2 ng/mL

## 2024-06-26 LAB — COMPREHENSIVE METABOLIC PANEL WITH GFR
AG Ratio: 1.5 (calc) (ref 1.0–2.5)
ALT: 51 U/L — ABNORMAL HIGH (ref 6–29)
AST: 29 U/L (ref 10–35)
Albumin: 4.1 g/dL (ref 3.6–5.1)
Alkaline phosphatase (APISO): 146 U/L — ABNORMAL HIGH (ref 31–125)
BUN: 13 mg/dL (ref 7–25)
CO2: 21 mmol/L (ref 20–32)
Calcium: 8.8 mg/dL (ref 8.6–10.2)
Chloride: 108 mmol/L (ref 98–110)
Creat: 0.71 mg/dL (ref 0.50–0.99)
Globulin: 2.8 g/dL (ref 1.9–3.7)
Glucose, Bld: 79 mg/dL (ref 65–139)
Potassium: 4 mmol/L (ref 3.5–5.3)
Sodium: 137 mmol/L (ref 135–146)
Total Bilirubin: 0.3 mg/dL (ref 0.2–1.2)
Total Protein: 6.9 g/dL (ref 6.1–8.1)
eGFR: 107 mL/min/1.73m2 (ref >=60)

## 2024-06-26 LAB — T4, FREE: Free T4: 0.9 ng/dL (ref 0.8–1.8)

## 2024-06-26 LAB — VITAMIN B12: Vitamin B-12: 380 pg/mL (ref 200–1100)

## 2024-06-26 LAB — LIPID PANEL
Cholesterol: 169 mg/dL (ref <200)
HDL: 47 mg/dL — ABNORMAL LOW (ref >=50)
LDL Cholesterol (Calc): 101 mg/dL — ABNORMAL HIGH
Non-HDL Cholesterol (Calc): 122 mg/dL (ref <130)
Total CHOL/HDL Ratio: 3.6 (calc) (ref <5.0)
Triglycerides: 114 mg/dL (ref <150)

## 2024-06-26 LAB — FERRITIN: Ferritin: 14 ng/mL — ABNORMAL LOW (ref 16–232)

## 2024-06-26 LAB — CBC
HCT: 37.6 % (ref 35.0–45.0)
Hemoglobin: 12.7 g/dL (ref 11.7–15.5)
MCH: 29.5 pg (ref 27.0–33.0)
MCHC: 33.8 g/dL (ref 32.0–36.0)
MCV: 87.4 fL (ref 80.0–100.0)
MPV: 10.1 fL (ref 7.5–12.5)
Platelets: 148 Thousand/uL (ref 140–400)
RBC: 4.3 Million/uL (ref 3.80–5.10)
RDW: 13.4 % (ref 11.0–15.0)
WBC: 8.6 Thousand/uL (ref 3.8–10.8)

## 2024-06-26 LAB — HEMOGLOBIN A1C
Hgb A1c MFr Bld: 5.6 % (ref <5.7)
Mean Plasma Glucose: 114 mg/dL
eAG (mmol/L): 6.3 mmol/L

## 2024-06-26 LAB — T3: T3, Total: 85 ng/dL (ref 76–181)

## 2024-06-26 LAB — FSH/LH
FSH: 3.4 m[IU]/mL
LH: 6.6 m[IU]/mL

## 2024-06-26 LAB — VITAMIN D 25 HYDROXY (VIT D DEFICIENCY, FRACTURES): Vit D, 25-Hydroxy: 44 ng/mL (ref 30–100)

## 2024-06-26 LAB — TESTOS,TOTAL,FREE AND SHBG (FEMALE)
Free Testosterone: 1.1 pg/mL (ref 0.1–6.4)
Sex Hormone Binding: 39 nmol/L (ref 17–124)
Testosterone, Total, LC-MS-MS: 9 ng/dL (ref 2–45)

## 2024-06-26 LAB — PROGESTERONE: Progesterone: 13.6 ng/mL

## 2024-06-26 LAB — TSH: TSH: 1.36 m[IU]/L

## 2024-07-03 ENCOUNTER — Ambulatory Visit (INDEPENDENT_AMBULATORY_CARE_PROVIDER_SITE_OTHER): Admitting: Internal Medicine

## 2024-07-03 ENCOUNTER — Encounter (INDEPENDENT_AMBULATORY_CARE_PROVIDER_SITE_OTHER): Payer: Self-pay | Admitting: Internal Medicine

## 2024-07-03 VITALS — BP 91/60 | HR 77 | Temp 98.2°F | Ht 66.0 in | Wt 199.0 lb

## 2024-07-03 DIAGNOSIS — Z6833 Body mass index (BMI) 33.0-33.9, adult: Secondary | ICD-10-CM

## 2024-07-03 DIAGNOSIS — R748 Abnormal levels of other serum enzymes: Secondary | ICD-10-CM

## 2024-07-03 DIAGNOSIS — E66811 Obesity, class 1: Secondary | ICD-10-CM

## 2024-07-03 DIAGNOSIS — K7689 Other specified diseases of liver: Secondary | ICD-10-CM | POA: Diagnosis not present

## 2024-07-03 DIAGNOSIS — R7303 Prediabetes: Secondary | ICD-10-CM | POA: Diagnosis not present

## 2024-07-03 MED ORDER — PHENTERMINE HCL 37.5 MG PO TABS: 37.5000 mg | ORAL_TABLET | Freq: Every day | ORAL | 0 refills | Status: DC

## 2024-07-03 MED ORDER — METFORMIN HCL ER 500 MG PO TB24: 500.0000 mg | ORAL_TABLET | Freq: Two times a day (BID) | ORAL | 0 refills | Status: DC

## 2024-07-03 MED ORDER — TOPIRAMATE 25 MG PO TABS: 25.0000 mg | ORAL_TABLET | Freq: Every evening | ORAL | 0 refills | Status: DC

## 2024-07-03 NOTE — Progress Notes (Unsigned)
 Office: [phone]  /  Fax: [phone]  Weight Summary and Body Composition Analysis (BIA)  Vitals Temp: 98.2 F (36.8 C) BP: 91/60 Pulse Rate: 77 SpO2: 98 %   Anthropometric Measurements Height: 5' 6 (1.676 m) Weight: 199 lb (90.3 kg) BMI (Calculated): 32.13 Weight at Last Visit: 203lb Weight Lost Since Last Visit: 4lb Weight Gained Since Last Visit: 0lb Starting Weight: 217lb Total Weight Loss (lbs): 18 lb (8.165 kg) Peak Weight: 226lb   Body Composition  Body Fat %: 42.4 % Fat Mass (lbs): 84.6 lbs Muscle Mass (lbs): 109.2 lbs Total Body Water  (lbs): 77.8 lbs Visceral Fat Rating : 10   The 10-year ASCVD risk score (Arnett DK, et al., 2019) is: 0.4%  RMR: 1987  Today's Visit #: 9  Starting Date: 11/24/22   Subjective   Chief Complaint: Obesity  Interval History Discussed the use of AI scribe software for clinical note transcription with the patient, who gave verbal consent to proceed.  History of Present Illness      Challenges affecting patient progress: {EMOBESITYBARRIERS:28841::none}.    Pharmacotherapy for weight management: Martha Page is currently taking {EMPharmaco:28845}.   Assessment and Plan   Treatment Plan For Obesity:  Recommended Dietary Goals  Martha Page is currently in the action stage of change. As such, her goal is to continue weight management plan. Martha Page has agreed to: {EMWTLOSSPLAN:29297::continue current plan}  Behavioral Health and Counseling  We discussed the following behavioral modification strategies today: {EMWMwtlossstrategies:28914::continue to work on maintaining a reduced calorie state, getting the recommended amount of protein, incorporating whole foods, making healthy choices, staying well hydrated and practicing mindfulness when eating.,increase protein intake, fibrous foods (25 grams per day for women, 30 grams for men) and water  to improve satiety and decrease hunger signals. }.  Additional education and  resources provided today: {EMadditionalresources:29169::None}  Recommended Physical Activity Goals  Martha Page has been advised to work up to 150 minutes of moderate intensity aerobic activity a week and strengthening exercises 2-3 times per week for cardiovascular health, weight loss maintenance and preservation of muscle mass.  Martha Page has agreed to :  {EMEXERCISE:28847::Think about enjoyable ways to increase daily physical activity and overcoming barriers to exercise,Increase physical activity in their day and reduce sedentary time (increase NEAT).,Increase volume of physical activity to a goal of 240 minutes a week,Combine aerobic and strengthening exercises for efficiency and improved cardiometabolic health.}  Medical Interventions and Pharmacotherapy  We discussed various medication options to help Martha Page with her weight loss efforts and we both agreed to : {EMagreedrx:29170}  Associated Conditions Impacted by Obesity Treatment  Assessment & Plan Liver nodule  Prediabetes  Class 1 obesity with serious comorbidity and body mass index (BMI) of 33.0 to 33.9 in adult, unspecified obesity type     Assessment and Plan Assessment & Plan       Objective   Physical Exam:  Blood pressure 91/60, pulse 77, temperature 98.2 F (36.8 C), height 5' 6 (1.676 m), weight 199 lb (90.3 kg), SpO2 98%. Body mass index is 32.12 kg/m.  General: Martha Page is overweight, cooperative, alert, well developed, and in no acute distress. PSYCH: Has normal mood, affect and thought process.   HEENT: EOMI, sclerae are anicteric. Lungs: Normal breathing effort, no conversational dyspnea. Extremities: No edema.  Neurologic: No gross sensory or motor deficits. No tremors or fasciculations noted.    Diagnostic Data Reviewed:  BMET    Component Value Date/Time   NA 137 06/21/2024 0000   NA 137 11/06/2023 1328   NA  137 05/26/2014 1855   K 4.0 06/21/2024 0000   K 3.3 (L) 05/26/2014 1855   CL 108  06/21/2024 0000   CL 105 05/26/2014 1855   CO2 21 06/21/2024 0000   CO2 20 (L) 05/26/2014 1855   GLUCOSE 79 06/21/2024 0000   GLUCOSE 131 (H) 05/26/2014 1855   BUN 13 06/21/2024 0000   BUN 13 11/06/2023 1328   BUN 5 (L) 05/26/2014 1855   CREATININE 0.71 06/21/2024 0000   CALCIUM  8.8 06/21/2024 0000   CALCIUM  8.4 (L) 05/26/2014 1855   GFRNONAA >60 01/24/2018 1543   GFRNONAA >60 05/26/2014 1855   GFRAA >60 01/24/2018 1543   GFRAA >60 05/26/2014 1855   Lab Results  Component Value Date   HGBA1C 5.6 06/21/2024   HGBA1C 5.3 10/16/2015   Lab Results  Component Value Date   INSULIN  11.0 10/25/2023   Lab Results  Component Value Date   TSH 1.36 06/21/2024   CBC    Component Value Date/Time   WBC 8.6 06/21/2024 0000   RBC 4.30 06/21/2024 0000   HGB 12.7 06/21/2024 0000   HGB 13.5 12/02/2021 1557   HCT 37.6 06/21/2024 0000   HCT 39.5 12/02/2021 1557   PLT 148 06/21/2024 0000   PLT 227 12/02/2021 1557   MCV 87.4 06/21/2024 0000   MCV 83 12/02/2021 1557   MCV 87 05/26/2014 1855   MCH 29.5 06/21/2024 0000   MCHC 33.8 06/21/2024 0000   RDW 13.4 06/21/2024 0000   RDW 13.5 12/02/2021 1557   RDW 18.2 (H) 05/26/2014 1855   Iron Studies    Component Value Date/Time   IRON 63 11/06/2023 1328   TIBC 276 11/06/2023 1328   FERRITIN 14 (L) 06/21/2024 0000   FERRITIN 14 (L) 11/06/2023 1328   IRONPCTSAT 23 11/06/2023 1328   Lipid Panel     Component Value Date/Time   CHOL 169 06/21/2024 0000   TRIG 114 06/21/2024 0000   HDL 47 (L) 06/21/2024 0000   CHOLHDL 3.6 06/21/2024 0000   VLDL 13.4 10/16/2015 0910   LDLCALC 101 (H) 06/21/2024 0000   Hepatic Function Panel     Component Value Date/Time   PROT 6.9 06/21/2024 0000   PROT 7.0 11/06/2023 1328   PROT 7.5 12/02/2013 1017   ALBUMIN 4.2 11/06/2023 1328   ALBUMIN 3.4 12/02/2013 1017   AST 29 06/21/2024 0000   AST 21 12/02/2013 1017   ALT 51 (H) 06/21/2024 0000   ALT 49 12/02/2013 1017   ALKPHOS 133 (H) 11/06/2023  1328   ALKPHOS 90 12/02/2013 1017   BILITOT 0.3 06/21/2024 0000   BILITOT 0.3 11/06/2023 1328   BILITOT 0.3 12/02/2013 1017   BILIDIR 0.1 01/27/2017 0844      Component Value Date/Time   TSH 1.36 06/21/2024 0000   Nutritional Lab Results  Component Value Date   VD25OH 44 06/21/2024   VD25OH 19.0 (L) 10/25/2023   VD25OH 27 (L) 08/24/2022    Medications: Outpatient Encounter Medications as of 07/03/2024  Medication Sig   cetirizine  (ZYRTEC ) 10 MG tablet TAKE 1 TABLET BY MOUTH EVERY DAY   ibuprofen  (ADVIL ) 800 MG tablet TAKE 1 TABLET BY MOUTH EVERY 8 HOURS AS NEEDED   LINZESS  145 MCG CAPS capsule Take 1 capsule (145 mcg total) by mouth daily before breakfast.   metFORMIN  (GLUCOPHAGE -XR) 500 MG 24 hr tablet Take 1 tablet (500 mg total) by mouth 2 (two) times daily with a meal.   MIRENA, 52 MG, 20 MCG/24HR IUD  pantoprazole  (PROTONIX ) 40 MG tablet Take 1 tablet (40 mg total) by mouth 2 (two) times daily.   phentermine  (ADIPEX-P ) 37.5 MG tablet Take 1 tablet (37.5 mg total) by mouth daily before breakfast.   SUMAtriptan  (IMITREX ) 100 MG tablet TAKE 1 TABLET (100 MG TOTAL) BY MOUTH EVERY 2 (TWO) HOURS AS NEEDED FOR MIGRAINE. MAY REPEAT IN 2 HOURS IF HEADACHE PERSISTS OR RECURS.   topiramate  (TOPAMAX ) 25 MG tablet Take 1 tablet (25 mg total) by mouth at bedtime.   traZODone  (DESYREL ) 50 MG tablet Take 100 mg by mouth at bedtime as needed.   venlafaxine  XR (EFFEXOR -XR) 150 MG 24 hr capsule Take 1 capsule (150 mg total) by mouth daily with breakfast.   No facility-administered encounter medications on file as of 07/03/2024.     Follow-Up   No follow-ups on file.Martha Page Martha Page was informed of the importance of frequent follow up visits to maximize her success with intensive lifestyle modifications for her multiple health conditions.  Attestation Statement   Reviewed by clinician on day of visit: allergies, medications, problem list, medical history, surgical history, family history, social  history, and previous encounter notes.     Lucas Parker, MD

## 2024-07-04 DIAGNOSIS — K7689 Other specified diseases of liver: Secondary | ICD-10-CM | POA: Insufficient documentation

## 2024-07-04 NOTE — Progress Notes (Signed)
 Office: [phone]  /  Fax: [phone]  Weight Summary And Body Composition Analysis (BIA)  Vitals Temp: 98.2 F (36.8 C) BP: 91/60 Pulse Rate: 77 SpO2: 98 %   Anthropometric Measurements Height: 5' 6 (1.676 m) Weight: 199 lb (90.3 kg) BMI (Calculated): 32.13 Weight at Last Visit: 203lb Weight Lost Since Last Visit: 4lb Weight Gained Since Last Visit: 0lb Starting Weight: 217lb Total Weight Loss (lbs): 18 lb (8.165 kg) Peak Weight: 226lb   Body Composition  Body Fat %: 42.4 % Fat Mass (lbs): 84.6 lbs Muscle Mass (lbs): 109.2 lbs Total Body Water  (lbs): 77.8 lbs Visceral Fat Rating : 10    RMR: 1987  Today's Visit #: 9  Starting Date: 11/24/22   Subjective   Chief Complaint: Obesity  Martha Page is here to discuss her progress with her obesity treatment plan. She is following the Category 2 plan - 1200 kcal per day and states she is following her eating plan approximately 90-100% of the time. She states she is exercising 30 minutes 4 times per week..  Weight Progress Since Last Visit:  Since last office visit she has lost 4 pounds. She reports good adherence to reduced calorie nutritional plan. She has been working on reading food labels, not skipping meals, increasing protein intake at every meal, drinking more water , making healthier choices, reducing portion sizes, and incorporating more whole foods   Nutritional 24 HR Recall: Intake consistent with prescribed nutritional plan  Challenges affecting patient progress: none.   Orexigenic Control: Reports improved problems with appetite and hunger signals.  Reports improved problems with satiety and satiation.  Denies problems with eating patterns and portion control.  Denies abnormal cravings. Denies feeling deprived or restricted.   Pharmacotherapy for weight management: She is currently taking Metformin  (off label use for weight management and / or insulin  resistance and / or diabetes prevention)  with adequate clinical response  and without side effects., Topiramate  (off label use, single agent) with adequate clinical response  and without side effects., and Phentermine  (longterm use, off-label, single agent)  with adequate clinical response  and without side effects..   Assessment and Plan   Treatment Plan For Obesity:  Recommended Dietary Goals  Martha Page is currently in the action stage of change. As such, her goal is to continue weight management plan. She has agreed to: continue current plan  Behavioral Health and Counseling  We discussed the following behavioral modification strategies today: continue to work on maintaining a reduced calorie state, getting the recommended amount of protein, incorporating whole foods, making healthy choices, staying well hydrated and practicing mindfulness when eating. and increase protein intake, fibrous foods (25 grams per day for women, 30 grams for men) and water  to improve satiety and decrease hunger signals. .  Additional education and resources provided today: None  Recommended Physical Activity Goals  Martha Page has been advised to work up to 150 minutes of moderate intensity aerobic activity a week and strengthening exercises 2-3 times per week for cardiovascular health, weight loss maintenance and preservation of muscle mass.   She has agreed to :  Think about enjoyable ways to increase daily physical activity and overcoming barriers to exercise, Increase physical activity in their day and reduce sedentary time (increase NEAT)., Increase volume of physical activity to a goal of 240 minutes a week, and Combine aerobic and strengthening exercises for efficiency and improved cardiometabolic health.  Pharmacotherapy and Medical Interventions  Adequate clinical response to anti-obesity medication, continue current regimen.  She has an  IUD.  She is aware about the teratogenic effects of topiramate .  Continue off label use of phentermine  and  topiramate  she has lost more than 5% over 6 months and therefore responding to treatment without any adverse effects.  Associated Conditions Impacted by Obesity Treatment  Assessment & Plan Liver nodule 1 cm echogenic mass possible hemangioma detected on ultrasound on January 2025.  Her alkaline phosphatase was 146 iron studies in the past did not indicate iron overload.  Per ALT was 51 with normal AST and bilirubin.  We will order follow-up ultrasound as recommended by radiology if abnormal she will be referred to gastroenterology for further evaluation. Prediabetes Most recent A1c is  Lab Results  Component Value Date   HGBA1C 5.6 06/21/2024   HGBA1C 5.3 10/16/2015    Patient aware of disease state and risk of progression. This may contribute to abnormal cravings, fatigue and diabetic complications without having diabetes.   Her hemoglobin A1c has improved.  Advised to maintain a diet low on simple and processed carbohydrates.  Her insurance denied Zepbound  for pharmacoprophylaxis.  She has now metformin  XR 500 mg twice a day without any adverse effects.  She will continue current management  Class 1 obesity with serious comorbidity and body mass index (BMI) of 33.0 to 33.9 in adult, unspecified obesity type Weight: decrease of 27.3 lb (12.1%) over 8 months, 3 weeks  Start: 10/12/2023 226 lb 4.8 oz (102.6 kg)  End: 07/03/2024 199 lb (90.3 kg)  Continue 1200-calorie low-carb nutrition plan Continue to work on increasing volume of physical activity for goal of 240 minutes a week Elevated liver enzymes Patient has persistent elevation liver enzymes.  She does not drink alcohol and denies risk factors for viral hepatitis.  She had a negative autoimmune workup in the past.  Negative hepatitis serologies and normal iron studies.  Ultrasound of the liver showed hepatic nodule but no signs of fatty liver infiltration.  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  We will obtain a  follow-up ultrasound and consider referral to gastroenterology.  Continue with current weight management strategy.  She does not have coverage for GLP-1    Objective   Physical Exam:  Blood pressure 91/60, pulse 77, temperature 98.2 F (36.8 C), height 5' 6 (1.676 m), weight 199 lb (90.3 kg), SpO2 98%. Body mass index is 32.12 kg/m.  General: She is overweight, cooperative, alert, well developed, and in no acute distress. PSYCH: Has normal mood, affect and thought process.   HEENT: EOMI, sclerae are anicteric. Lungs: Normal breathing effort, no conversational dyspnea. Extremities: No edema.  Neurologic: No gross sensory or motor deficits. No tremors or fasciculations noted.    Diagnostic Data Reviewed:  BMET    Component Value Date/Time   NA 137 06/21/2024 0000   NA 137 11/06/2023 1328   NA 137 05/26/2014 1855   K 4.0 06/21/2024 0000   K 3.3 (L) 05/26/2014 1855   CL 108 06/21/2024 0000   CL 105 05/26/2014 1855   CO2 21 06/21/2024 0000   CO2 20 (L) 05/26/2014 1855   GLUCOSE 79 06/21/2024 0000   GLUCOSE 131 (H) 05/26/2014 1855   BUN 13 06/21/2024 0000   BUN 13 11/06/2023 1328   BUN 5 (L) 05/26/2014 1855   CREATININE 0.71 06/21/2024 0000   CALCIUM  8.8 06/21/2024 0000   CALCIUM  8.4 (L) 05/26/2014 1855   GFRNONAA >60 01/24/2018 1543   GFRNONAA >60 05/26/2014 1855   GFRAA >60 01/24/2018 1543   GFRAA >60 05/26/2014  1855   Lab Results  Component Value Date   HGBA1C 5.6 06/21/2024   HGBA1C 5.3 10/16/2015   Lab Results  Component Value Date   INSULIN  11.0 10/25/2023   Lab Results  Component Value Date   TSH 1.36 06/21/2024   CBC    Component Value Date/Time   WBC 8.6 06/21/2024 0000   RBC 4.30 06/21/2024 0000   HGB 12.7 06/21/2024 0000   HGB 13.5 12/02/2021 1557   HCT 37.6 06/21/2024 0000   HCT 39.5 12/02/2021 1557   PLT 148 06/21/2024 0000   PLT 227 12/02/2021 1557   MCV 87.4 06/21/2024 0000   MCV 83 12/02/2021 1557   MCV 87 05/26/2014 1855   MCH 29.5  06/21/2024 0000   MCHC 33.8 06/21/2024 0000   RDW 13.4 06/21/2024 0000   RDW 13.5 12/02/2021 1557   RDW 18.2 (H) 05/26/2014 1855   Iron Studies    Component Value Date/Time   IRON 63 11/06/2023 1328   TIBC 276 11/06/2023 1328   FERRITIN 14 (L) 06/21/2024 0000   FERRITIN 14 (L) 11/06/2023 1328   IRONPCTSAT 23 11/06/2023 1328   Lipid Panel     Component Value Date/Time   CHOL 169 06/21/2024 0000   TRIG 114 06/21/2024 0000   HDL 47 (L) 06/21/2024 0000   CHOLHDL 3.6 06/21/2024 0000   VLDL 13.4 10/16/2015 0910   LDLCALC 101 (H) 06/21/2024 0000   Hepatic Function Panel     Component Value Date/Time   PROT 6.9 06/21/2024 0000   PROT 7.0 11/06/2023 1328   PROT 7.5 12/02/2013 1017   ALBUMIN 4.2 11/06/2023 1328   ALBUMIN 3.4 12/02/2013 1017   AST 29 06/21/2024 0000   AST 21 12/02/2013 1017   ALT 51 (H) 06/21/2024 0000   ALT 49 12/02/2013 1017   ALKPHOS 133 (H) 11/06/2023 1328   ALKPHOS 90 12/02/2013 1017   BILITOT 0.3 06/21/2024 0000   BILITOT 0.3 11/06/2023 1328   BILITOT 0.3 12/02/2013 1017   BILIDIR 0.1 01/27/2017 0844      Component Value Date/Time   TSH 1.36 06/21/2024 0000   Nutritional Lab Results  Component Value Date   VD25OH 44 06/21/2024   VD25OH 19.0 (L) 10/25/2023   VD25OH 27 (L) 08/24/2022    Medications: Outpatient Encounter Medications as of 07/03/2024  Medication Sig   cetirizine  (ZYRTEC ) 10 MG tablet TAKE 1 TABLET BY MOUTH EVERY DAY   ibuprofen  (ADVIL ) 800 MG tablet TAKE 1 TABLET BY MOUTH EVERY 8 HOURS AS NEEDED   LINZESS  145 MCG CAPS capsule Take 1 capsule (145 mcg total) by mouth daily before breakfast.   MIRENA, 52 MG, 20 MCG/24HR IUD    pantoprazole  (PROTONIX ) 40 MG tablet Take 1 tablet (40 mg total) by mouth 2 (two) times daily.   SUMAtriptan  (IMITREX ) 100 MG tablet TAKE 1 TABLET (100 MG TOTAL) BY MOUTH EVERY 2 (TWO) HOURS AS NEEDED FOR MIGRAINE. MAY REPEAT IN 2 HOURS IF HEADACHE PERSISTS OR RECURS.   traZODone  (DESYREL ) 50 MG tablet Take  100 mg by mouth at bedtime as needed.   venlafaxine  XR (EFFEXOR -XR) 150 MG 24 hr capsule Take 1 capsule (150 mg total) by mouth daily with breakfast.   [DISCONTINUED] metFORMIN  (GLUCOPHAGE -XR) 500 MG 24 hr tablet Take 1 tablet (500 mg total) by mouth 2 (two) times daily with a meal.   [DISCONTINUED] phentermine  (ADIPEX-P ) 37.5 MG tablet Take 1 tablet (37.5 mg total) by mouth daily before breakfast.   [DISCONTINUED] topiramate  (TOPAMAX ) 25 MG tablet  Take 1 tablet (25 mg total) by mouth at bedtime.   metFORMIN  (GLUCOPHAGE -XR) 500 MG 24 hr tablet Take 1 tablet (500 mg total) by mouth 2 (two) times daily with a meal.   phentermine  (ADIPEX-P ) 37.5 MG tablet Take 1 tablet (37.5 mg total) by mouth daily before breakfast.   topiramate  (TOPAMAX ) 25 MG tablet Take 1 tablet (25 mg total) by mouth at bedtime.   No facility-administered encounter medications on file as of 07/03/2024.     Follow-Up   Return in about 4 weeks (around 07/31/2024) for For Weight Mangement with Dr. Francyne.SABRA She was informed of the importance of frequent follow up visits to maximize her success with intensive lifestyle modifications for her multiple health conditions.  Attestation Statement   Reviewed by clinician on day of visit: allergies, medications, problem list, medical history, surgical history, family history, social history, and previous encounter notes.     Lucas Francyne, MD

## 2024-07-04 NOTE — Assessment & Plan Note (Signed)
 1 cm echogenic mass possible hemangioma detected on ultrasound on January 2025.  Her alkaline phosphatase was 146 iron studies in the past did not indicate iron overload.  Per ALT was 51 with normal AST and bilirubin.  We will order follow-up ultrasound as recommended by radiology if abnormal she will be referred to gastroenterology for further evaluation.

## 2024-07-04 NOTE — Assessment & Plan Note (Signed)
 Weight: decrease of 27.3 lb (12.1%) over 8 months, 3 weeks  Start: 10/12/2023 226 lb 4.8 oz (102.6 kg)  End: 07/03/2024 199 lb (90.3 kg)  Continue 1200-calorie low-carb nutrition plan Continue to work on increasing volume of physical activity for goal of 240 minutes a week

## 2024-07-04 NOTE — Assessment & Plan Note (Signed)
 Patient has persistent elevation liver enzymes.  She does not drink alcohol and denies risk factors for viral hepatitis.  She had a negative autoimmune workup in the past.  Negative hepatitis serologies and normal iron studies.  Ultrasound of the liver showed hepatic nodule but no signs of fatty liver infiltration.  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  We will obtain a follow-up ultrasound and consider referral to gastroenterology.  Continue with current weight management strategy.  She does not have coverage for GLP-1

## 2024-07-04 NOTE — Assessment & Plan Note (Signed)
 Most recent A1c is  Lab Results  Component Value Date   HGBA1C 5.6 06/21/2024   HGBA1C 5.3 10/16/2015    Patient aware of disease state and risk of progression. This may contribute to abnormal cravings, fatigue and diabetic complications without having diabetes.   Her hemoglobin A1c has improved.  Advised to maintain a diet low on simple and processed carbohydrates.  Her insurance denied Zepbound  for pharmacoprophylaxis.  She has now metformin  XR 500 mg twice a day without any adverse effects.  She will continue current management

## 2024-07-05 ENCOUNTER — Ambulatory Visit (INDEPENDENT_AMBULATORY_CARE_PROVIDER_SITE_OTHER): Admitting: Internal Medicine

## 2024-07-05 ENCOUNTER — Encounter: Payer: Self-pay | Admitting: Internal Medicine

## 2024-07-05 VITALS — BP 98/68 | HR 95 | Ht 66.0 in | Wt 202.8 lb

## 2024-07-05 DIAGNOSIS — R058 Other specified cough: Secondary | ICD-10-CM

## 2024-07-05 MED ORDER — BENZONATATE 200 MG PO CAPS: 200.0000 mg | ORAL_CAPSULE | Freq: Three times a day (TID) | ORAL | 0 refills | Status: DC | PRN

## 2024-07-05 MED ORDER — PROMETHAZINE-DM 6.25-15 MG/5ML PO SYRP: 5.0000 mL | ORAL_SOLUTION | Freq: Four times a day (QID) | ORAL | 0 refills | Status: DC | PRN

## 2024-07-05 NOTE — Patient Instructions (Signed)

## 2024-07-05 NOTE — Progress Notes (Signed)
 Subjective:    Patient ID: Martha Page, female    DOB: [birth year], 43 y.o.   MRN: [id number]  HPI  Discussed the use of AI scribe software for clinical note transcription with the patient, who gave verbal consent to proceed.  Martha Page is a 43 year old female who presents with a persistent cough lasting for one month.  She has experienced a persistent cough for approximately one month, which began after a trip to the beach from the 19th to the 26th of the previous month. Initially suspecting COVID-19, she tested negative twice. Her initial symptoms improved with over-the-counter medications, but the cough persisted.  The cough was initially productive but has since become less so, with a sensation of phlegm that is difficult to clear. No post-nasal drip, headache, runny nose, nasal congestion, ear pain, sore throat, nausea, vomiting, diarrhea, fever, chills, or body aches. She has tried Advil  Sinus, Sudafed, and other cough remedies without significant relief.  She has a history of allergies, managed with daily Zyrtec , and gastroesophageal reflux disease, managed with pantoprazole  40 mg twice daily. She denies smoking and does not experience heartburn or reflux symptoms. The cough worsens at night, affecting her sleep, especially with the use of her CPAP machine, which she feels dries out her throat.       Review of Systems     Past Medical History:  Diagnosis Date   Acne    iPledge: [phone]   Anxiety    Bursitis of right hip    Chronic constipation    Cluster headaches    Depression    Dermatitis    eczema   Elevated liver enzymes    Frequent headaches    GERD (gastroesophageal reflux disease)    Hx of dysplastic nevus 04/02/2013   Left distal knee. Mild atypia, margins free   Insomnia    Left knee pain    Migraine    2x/6 mos   Obesity    OSA (obstructive sleep apnea)    Osteoarthritis    Overactive bladder    Pneumonia 06/30/2016   has finished  antibiotics.  still has lingering nighttime cough   POTS (postural orthostatic tachycardia syndrome)    Recurrent oral ulcers    Shoulder pain    Sleep apnea    uses CPAP   Vitamin B 12 deficiency    Vitamin D  deficiency     Current Outpatient Medications  Medication Sig Dispense Refill   cetirizine  (ZYRTEC ) 10 MG tablet TAKE 1 TABLET BY MOUTH EVERY DAY 90 tablet 0   ibuprofen  (ADVIL ) 800 MG tablet TAKE 1 TABLET BY MOUTH EVERY 8 HOURS AS NEEDED 90 tablet 0   LINZESS  145 MCG CAPS capsule Take 1 capsule (145 mcg total) by mouth daily before breakfast. 90 capsule 1   metFORMIN  (GLUCOPHAGE -XR) 500 MG 24 hr tablet Take 1 tablet (500 mg total) by mouth 2 (two) times daily with a meal. 180 tablet 0   MIRENA, 52 MG, 20 MCG/24HR IUD      pantoprazole  (PROTONIX ) 40 MG tablet Take 1 tablet (40 mg total) by mouth 2 (two) times daily. 180 tablet 1   phentermine  (ADIPEX-P ) 37.5 MG tablet Take 1 tablet (37.5 mg total) by mouth daily before breakfast. 30 tablet 0   SUMAtriptan  (IMITREX ) 100 MG tablet TAKE 1 TABLET (100 MG TOTAL) BY MOUTH EVERY 2 (TWO) HOURS AS NEEDED FOR MIGRAINE. MAY REPEAT IN 2 HOURS IF HEADACHE PERSISTS OR RECURS. 10 tablet 1   topiramate  (  TOPAMAX ) 25 MG tablet Take 1 tablet (25 mg total) by mouth at bedtime. 90 tablet 0   traZODone  (DESYREL ) 50 MG tablet Take 100 mg by mouth at bedtime as needed.     venlafaxine  XR (EFFEXOR -XR) 150 MG 24 hr capsule Take 1 capsule (150 mg total) by mouth daily with breakfast. 90 capsule 1   No current facility-administered medications for this visit.    Allergies  Allergen Reactions   Cefazolin Rash and Shortness Of Breath   Codeine Sulfate Itching   Red Dye #40 (Allura Red) Hives   Sulfa Antibiotics Other (See Comments)    Stomach pain   Amoxicillin Rash   Keflex [Cephalexin] Swelling and Rash   Penicillin V Potassium Rash    Family History  Problem Relation Age of Onset   Stroke Mother    Depression Mother    Interstitial cystitis  Mother    Fibromyalgia Mother    Diabetes Mother    Post-traumatic stress disorder Mother    Anxiety disorder Mother    Obesity Mother    Heart disease Father    Throat cancer Father    Hypertension Father    High Cholesterol Father    Depression Father    Alcoholism Father    Healthy Sister    Aortic aneurysm Brother 69   Arthritis Paternal Grandmother    Lung cancer Paternal Grandmother    Heart disease Paternal Grandmother    Stroke Paternal Grandmother    Hypertension Paternal Grandmother    Breast cancer Neg Hx     Social History   Socioeconomic History   Marital status: Married    Spouse name: Reyes   Number of children: 6   Years of education: Not on file   Highest education level: Associate degree: occupational, Scientist, product/process development, or vocational program  Occupational History   Occupation: Admin Assist  Tobacco Use   Smoking status: Former    Current packs/day: 0.00    Types: Cigarettes    Quit date: 07/01/2011    Years since quitting: 13.0    Passive exposure: Past   Smokeless tobacco: Never  Vaping Use   Vaping status: Never Used  Substance and Sexual Activity   Alcohol use: No    Alcohol/week: 0.0 standard drinks of alcohol   Drug use: No   Sexual activity: Yes    Birth control/protection: I.U.D.  Other Topics Concern   Not on file  Social History Narrative   Married.   6 children.   Works as a Futures trader.      Social Drivers of Corporate investment banker Strain: Low Risk  (06/17/2024)   Overall Financial Resource Strain (CARDIA)    Difficulty of Paying Living Expenses: Not hard at all  Food Insecurity: No Food Insecurity (06/17/2024)   Hunger Vital Sign    Worried About Running Out of Food in the Last Year: Never true    Ran Out of Food in the Last Year: Never true  Transportation Needs: No Transportation Needs (06/17/2024)   PRAPARE - Administrator, Civil Service (Medical): No    Lack of Transportation (Non-Medical): No  Physical  Activity: Insufficiently Active (06/17/2024)   Exercise Vital Sign    Days of Exercise per Week: 3 days    Minutes of Exercise per Session: 30 min  Stress: No Stress Concern Present (06/17/2024)   [employer] of Occupational Health - Occupational Stress Questionnaire    Feeling of Stress: Only a little  Social Connections: Socially Integrated (  06/17/2024)   Social Connection and Isolation Panel    Frequency of Communication with Friends and Family: More than three times a week    Frequency of Social Gatherings with Friends and Family: Twice a week    Attends Religious Services: More than 4 times per year    Active Member of [employer] or Organizations: Yes    Attends Engineer, structural: More than 4 times per year    Marital Status: Married  Catering manager Violence: Not on file     Constitutional: Patient reports intermittent headaches.  Denies fever, malaise, fatigue, or abrupt weight changes.  HEENT: Denies eye pain, eye redness, ear pain, ringing in the ears, wax buildup, runny nose, nasal congestion, bloody nose, or sore throat. Respiratory: Pt reports cough. Denies difficulty breathing, shortness of breath, or sputum production.   Cardiovascular: Denies chest pain, chest tightness, palpitations or swelling in the hands or feet.  Gastrointestinal: Patient reports chronic constipation.  Denies abdominal pain, bloating, diarrhea or blood in the stool.  Neurological: Pt reports intermittent dizziness, hot flashes, insomnia and brain fog. Denies dizziness, difficulty with speech or problems with balance and coordination.   No other specific complaints in a complete review of systems (except as listed in HPI above).  Objective:   Physical Exam BP 98/68 (BP Location: Left Arm, Patient Position: Sitting, Cuff Size: Normal)   Pulse 95   Ht 5' 6 (1.676 m)   Wt 202 lb 12.8 oz (92 kg)   LMP  (LMP Unknown)   SpO2 99%   BMI 32.73 kg/m     Wt Readings from Last 3 Encounters:   07/03/24 199 lb (90.3 kg)  06/21/24 204 lb (92.5 kg)  06/03/24 203 lb (92.1 kg)    General: Appears her stated age, obese, in NAD. Skin: Warm, dry and intact. HEENT: Head: normal shape and size; Eyes: sclera white, no icterus, conjunctiva pink, PERRLA and EOMs intact;  Neck: No adenopathy noted. Cardiovascular: Normal rate and rhythm. S1,S2 noted.  No murmur, rubs or gallops noted.  Pulmonary/Chest: Normal effort and positive vesicular breath sounds. No respiratory distress. No wheezes, rales or ronchi noted. Neurological: Alert and oriented.     BMET    Component Value Date/Time   NA 137 06/21/2024 0000   NA 137 11/06/2023 1328   NA 137 05/26/2014 1855   K 4.0 06/21/2024 0000   K 3.3 (L) 05/26/2014 1855   CL 108 06/21/2024 0000   CL 105 05/26/2014 1855   CO2 21 06/21/2024 0000   CO2 20 (L) 05/26/2014 1855   GLUCOSE 79 06/21/2024 0000   GLUCOSE 131 (H) 05/26/2014 1855   BUN 13 06/21/2024 0000   BUN 13 11/06/2023 1328   BUN 5 (L) 05/26/2014 1855   CREATININE 0.71 06/21/2024 0000   CALCIUM  8.8 06/21/2024 0000   CALCIUM  8.4 (L) 05/26/2014 1855   GFRNONAA >60 01/24/2018 1543   GFRNONAA >60 05/26/2014 1855   GFRAA >60 01/24/2018 1543   GFRAA >60 05/26/2014 1855    Lipid Panel     Component Value Date/Time   CHOL 169 06/21/2024 0000   TRIG 114 06/21/2024 0000   HDL 47 (L) 06/21/2024 0000   CHOLHDL 3.6 06/21/2024 0000   VLDL 13.4 10/16/2015 0910   LDLCALC 101 (H) 06/21/2024 0000    CBC    Component Value Date/Time   WBC 8.6 06/21/2024 0000   RBC 4.30 06/21/2024 0000   HGB 12.7 06/21/2024 0000   HGB 13.5 12/02/2021 1557  HCT 37.6 06/21/2024 0000   HCT 39.5 12/02/2021 1557   PLT 148 06/21/2024 0000   PLT 227 12/02/2021 1557   MCV 87.4 06/21/2024 0000   MCV 83 12/02/2021 1557   MCV 87 05/26/2014 1855   MCH 29.5 06/21/2024 0000   MCHC 33.8 06/21/2024 0000   RDW 13.4 06/21/2024 0000   RDW 13.5 12/02/2021 1557   RDW 18.2 (H) 05/26/2014 1855   LYMPHSABS 2.4  12/02/2021 1557   MONOABS 0.4 04/14/2015 1644   EOSABS 0.0 12/02/2021 1557   BASOSABS 0.0 12/02/2021 1557    Hgb A1C Lab Results  Component Value Date   HGBA1C 5.6 06/21/2024           Assessment & Plan:   Assessment and Plan    Chronic cough Chronic cough for one month, likely post-viral. Negative COVID tests. No associated symptoms or smoking history. Lungs clear. Differential includes allergies and reflux, but less likely. Chest x-ray deferred. - Prescribed Tessalon  Perles 200 mg 3 times daily as needed for daytime. - Prescribed Promethazine  DM cough syrup for nighttime-sedation caution given. - Increased Zyrtec  to twice daily for one week. - Chest x-ray deferred unless symptoms worsen. - Advised to update if symptoms worsen or persist.     RTC in 6 months for your annual exam Angeline Laura, NP

## 2024-07-08 ENCOUNTER — Other Ambulatory Visit (INDEPENDENT_AMBULATORY_CARE_PROVIDER_SITE_OTHER): Payer: Self-pay | Admitting: Internal Medicine

## 2024-07-08 DIAGNOSIS — Z6833 Body mass index (BMI) 33.0-33.9, adult: Secondary | ICD-10-CM

## 2024-07-10 ENCOUNTER — Ambulatory Visit
Admission: RE | Admit: 2024-07-10 | Discharge: 2024-07-10 | Disposition: A | Discharge status: Home / Self Care | Source: Ambulatory Visit | Attending: Internal Medicine | Admitting: Internal Medicine

## 2024-07-10 DIAGNOSIS — K7689 Other specified diseases of liver: Secondary | ICD-10-CM | POA: Diagnosis present

## 2024-07-12 ENCOUNTER — Encounter (INDEPENDENT_AMBULATORY_CARE_PROVIDER_SITE_OTHER): Payer: Self-pay | Admitting: Internal Medicine

## 2024-07-15 ENCOUNTER — Ambulatory Visit (INDEPENDENT_AMBULATORY_CARE_PROVIDER_SITE_OTHER): Payer: Self-pay | Admitting: Internal Medicine

## 2024-07-19 ENCOUNTER — Encounter: Payer: Self-pay | Admitting: Internal Medicine

## 2024-07-19 MED ORDER — PREDNISONE 10 MG PO TABS
ORAL_TABLET | ORAL | 0 refills | Status: DC
Start: 2024-07-19 — End: 2024-07-31

## 2024-07-31 ENCOUNTER — Encounter (INDEPENDENT_AMBULATORY_CARE_PROVIDER_SITE_OTHER): Payer: Self-pay | Admitting: Internal Medicine

## 2024-07-31 ENCOUNTER — Ambulatory Visit (INDEPENDENT_AMBULATORY_CARE_PROVIDER_SITE_OTHER): Admitting: Internal Medicine

## 2024-07-31 VITALS — BP 94/66 | HR 96 | Temp 97.7°F | Ht 66.0 in | Wt 196.0 lb

## 2024-07-31 DIAGNOSIS — E66811 Obesity, class 1: Secondary | ICD-10-CM

## 2024-07-31 DIAGNOSIS — Z6833 Body mass index (BMI) 33.0-33.9, adult: Secondary | ICD-10-CM

## 2024-07-31 DIAGNOSIS — K7689 Other specified diseases of liver: Secondary | ICD-10-CM | POA: Diagnosis not present

## 2024-07-31 DIAGNOSIS — R7303 Prediabetes: Secondary | ICD-10-CM

## 2024-07-31 DIAGNOSIS — G4733 Obstructive sleep apnea (adult) (pediatric): Secondary | ICD-10-CM

## 2024-07-31 DIAGNOSIS — R748 Abnormal levels of other serum enzymes: Secondary | ICD-10-CM | POA: Diagnosis not present

## 2024-07-31 MED ORDER — PHENTERMINE HCL 37.5 MG PO TABS: 37.5000 mg | ORAL_TABLET | Freq: Every day | ORAL | 0 refills | Status: DC

## 2024-07-31 NOTE — Assessment & Plan Note (Signed)
 1 cm echogenic mass possible hemangioma detected on ultrasound on January 2025.  Her alkaline phosphatase was 146 iron studies in the past did not indicate iron overload.  Her ALT was 51 with normal AST and bilirubin.  Follow-up ultrasound last month showed stable 0.9 cm echogenic mass a 61-month follow-up was recommended.  I have notified primary care team and they will be following up.  She does have persistent elevation in her ALT had a negative SPEP, iron studies and hepatitis serologies because of her age and absence of hepatic steatosis on ultrasound and liver nodule I will refer her to gastroenterology for further evaluation.  She does not drink alcohol on a regular basis and is not on steatogenic medication.

## 2024-07-31 NOTE — Progress Notes (Signed)
 Office: [phone]  /  Fax: [phone]  Weight Summary and Body Composition Analysis (BIA)  Vitals Temp: 97.7 F (36.5 C) BP: 94/66 Pulse Rate: 96 SpO2: 99 %   Anthropometric Measurements Height: 5' 6 (1.676 m) Weight: 196 lb (88.9 kg) BMI (Calculated): 31.65 Weight at Last Visit: 199 lb Weight Lost Since Last Visit: 3 lb Weight Gained Since Last Visit: 0 lb Starting Weight: 217 lb Total Weight Loss (lbs): 21 lb (9.526 kg) Peak Weight: 226 lb   Body Composition  Body Fat %: 40.6 % Fat Mass (lbs): 79.8 lbs Muscle Mass (lbs): 110.8 lbs Total Body Water  (lbs): 74.6 lbs Visceral Fat Rating : 9    RMR: 1987  Today's Visit #: 10  Starting Date: 11/24/22   Subjective   Chief Complaint: Obesity  Interval History Discussed the use of AI scribe software for clinical note transcription with the patient, who gave verbal consent to proceed.  History of Present Illness Martha Page is a 43 year old female with hypercholesterolemia and prediabetes who presents for medical weight management.  She is adhering to a 1200 calorie nutrition plan 80% of the time and engages in physical activity four days a week for 15 to 20 minutes. Since her last visit, she has achieved a weight loss of three pounds, contributing to a total weight loss of approximately 30 pounds over nine months, which is about 13.4% of her body weight. Her body fat percentage has decreased to 40%. Her visceral fat rating is nine.  She is preparing for left hip surgery in three weeks and is currently participating in physical therapy at home through [facility] to enhance strength. Her husband will assist her post-surgery, and she plans to take two weeks off work, followed by working from home.  She has a history of hypercholesterolemia since childhood and is managing prediabetes with metformin , for which she does not require a refill at this time. She is also on topiramate  and phentermine  for weight  management and needs a refill for phentermine .  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 She takes pantoprazole  40 mg twice daily for gastrointestinal issues and Linzess  for constipation, which she attributes to the absence of her gallbladder.     Challenges affecting patient progress: none and having difficulties with GLP-1 or AOM coverage.    Pharmacotherapy for weight management: She is currently taking Metformin  (off label use for weight management and / or insulin  resistance and / or diabetes prevention) with adequate clinical response  and without side effects., Topiramate  (off label use, single agent) with adequate clinical response  and without side effects., and Phentermine  (longterm use, off-label, single agent)  with adequate clinical response  and without side effects..   Assessment and Plan   Treatment Plan For Obesity:  Recommended Dietary Goals  Martha Page is currently in the action stage of change. As such, her goal is to continue weight management plan. She has agreed to: continue current plan  Behavioral Health and Counseling  We discussed the following behavioral modification strategies today: continue to work on maintaining a reduced calorie state, getting the recommended amount of protein, incorporating whole foods, making healthy choices, staying well hydrated and practicing mindfulness when eating. and increase protein intake, fibrous foods (25 grams per day for women, 30 grams for men) and water  to improve satiety and decrease hunger signals. .  Additional education and resources provided today: None  Recommended  Physical Activity Goals  Martha Page has been advised to work up to 150 minutes of moderate intensity aerobic activity a week and strengthening exercises 2-3 times per week for cardiovascular health, weight loss maintenance and preservation of muscle mass.  She has agreed to :   Increase volume of physical activity to a goal of 240 minutes a week and Combine aerobic and strengthening exercises for efficiency and improved cardiometabolic health.  Medical Interventions and Pharmacotherapy  We discussed various medication options to help Martha Page with her weight loss efforts and we both agreed to : Adequate clinical response to anti-obesity medication, continue current regimen.  Low risk of pregnancy on topiramate , phentermine  and metformin .  Associated Conditions Impacted by Obesity Treatment  Assessment & Plan OSA (obstructive sleep apnea) I reviewed sleep study from 2020 she has severe OSA particularly in the supine position with an AHI of 56.  She is currently on PAP therapy.  Losing 15% of body weight may reduce AHI and improve outcomes.  Insurance denied coverage for GLP-1 recently Liver nodule Elevated liver enzymes 1 cm echogenic mass possible hemangioma detected on ultrasound on January 2025.  Her alkaline phosphatase was 146 iron studies in the past did not indicate iron overload.  Her ALT was 51 with normal AST and bilirubin.  Follow-up ultrasound last month showed stable 0.9 cm echogenic mass a 39-month follow-up was recommended.  I have notified primary care team and they will be following up.  She does have persistent elevation in her ALT had a negative SPEP, iron studies and hepatitis serologies because of her age and absence of hepatic steatosis on ultrasound and liver nodule I will refer her to gastroenterology for further evaluation.  She does not drink alcohol on a regular basis and is not on steatogenic medication. Class 1 obesity with serious comorbidity and body mass index (BMI) of 33.0 to 33.9 in adult, unspecified obesity type She has lost 30 pounds, which is 13.4% of her body weight over nine months, indicating a good response to her current weight management plan. She is following a 1200 calorie nutrition plan 80% of the time and exercises four days a  week for 15-20 minutes. Her body fat percentage has decreased to 40%, with a goal of under 34%. Muscle mass has increased, and visceral fat rating is down to 9, indicating improvements in both subcutaneous and visceral fat. The use of compounded peptides was discussed, highlighting safety concerns due to lack of regulation and potential contaminants, leading to adverse effects. The FDA does not endorse compounded peptides due to these safety concerns. - Continue current nutrition and exercise plan - Hold phentermine  for three days prior to surgery due to anesthesia - Continue metformin , phentermine  and topiramate  for weight management and abnormal hunger. Prediabetes Most recent A1c is  Lab Results  Component Value Date   HGBA1C 5.6 06/21/2024   HGBA1C 5.3 10/16/2015    Has improved previous levels were above 5.7.  She is on metformin  for pharmacoprophylaxis without any adverse effects.  Her insurance denied coverage for GLP-1.        Objective   Physical Exam:  Blood pressure 94/66, pulse 96, temperature 97.7 F (36.5 C), height 5' 6 (1.676 m), weight 196 lb (88.9 kg), SpO2 99%. Body mass index is 31.64 kg/m.  General: She is overweight, cooperative, alert, well developed, and in no acute distress. PSYCH: Has normal mood, affect and thought process.   HEENT: EOMI, sclerae are anicteric. Lungs: Normal breathing effort, no conversational dyspnea. Extremities:  No edema.  Neurologic: No gross sensory or motor deficits. No tremors or fasciculations noted.    Diagnostic Data Reviewed:  BMET    Component Value Date/Time   NA 137 06/21/2024 0000   NA 137 11/06/2023 1328   NA 137 05/26/2014 1855   K 4.0 06/21/2024 0000   K 3.3 (L) 05/26/2014 1855   CL 108 06/21/2024 0000   CL 105 05/26/2014 1855   CO2 21 06/21/2024 0000   CO2 20 (L) 05/26/2014 1855   GLUCOSE 79 06/21/2024 0000   GLUCOSE 131 (H) 05/26/2014 1855   BUN 13 06/21/2024 0000   BUN 13 11/06/2023 1328   BUN 5  (L) 05/26/2014 1855   CREATININE 0.71 06/21/2024 0000   CALCIUM  8.8 06/21/2024 0000   CALCIUM  8.4 (L) 05/26/2014 1855   GFRNONAA >60 01/24/2018 1543   GFRNONAA >60 05/26/2014 1855   GFRAA >60 01/24/2018 1543   GFRAA >60 05/26/2014 1855   Lab Results  Component Value Date   HGBA1C 5.6 06/21/2024   HGBA1C 5.3 10/16/2015   Lab Results  Component Value Date   INSULIN  11.0 10/25/2023   Lab Results  Component Value Date   TSH 1.36 06/21/2024   CBC    Component Value Date/Time   WBC 8.6 06/21/2024 0000   RBC 4.30 06/21/2024 0000   HGB 12.7 06/21/2024 0000   HGB 13.5 12/02/2021 1557   HCT 37.6 06/21/2024 0000   HCT 39.5 12/02/2021 1557   PLT 148 06/21/2024 0000   PLT 227 12/02/2021 1557   MCV 87.4 06/21/2024 0000   MCV 83 12/02/2021 1557   MCV 87 05/26/2014 1855   MCH 29.5 06/21/2024 0000   MCHC 33.8 06/21/2024 0000   RDW 13.4 06/21/2024 0000   RDW 13.5 12/02/2021 1557   RDW 18.2 (H) 05/26/2014 1855   Iron Studies    Component Value Date/Time   IRON 63 11/06/2023 1328   TIBC 276 11/06/2023 1328   FERRITIN 14 (L) 06/21/2024 0000   FERRITIN 14 (L) 11/06/2023 1328   IRONPCTSAT 23 11/06/2023 1328   Lipid Panel     Component Value Date/Time   CHOL 169 06/21/2024 0000   TRIG 114 06/21/2024 0000   HDL 47 (L) 06/21/2024 0000   CHOLHDL 3.6 06/21/2024 0000   VLDL 13.4 10/16/2015 0910   LDLCALC 101 (H) 06/21/2024 0000   Hepatic Function Panel     Component Value Date/Time   PROT 6.9 06/21/2024 0000   PROT 7.0 11/06/2023 1328   PROT 7.5 12/02/2013 1017   ALBUMIN 4.2 11/06/2023 1328   ALBUMIN 3.4 12/02/2013 1017   AST 29 06/21/2024 0000   AST 21 12/02/2013 1017   ALT 51 (H) 06/21/2024 0000   ALT 49 12/02/2013 1017   ALKPHOS 133 (H) 11/06/2023 1328   ALKPHOS 90 12/02/2013 1017   BILITOT 0.3 06/21/2024 0000   BILITOT 0.3 11/06/2023 1328   BILITOT 0.3 12/02/2013 1017   BILIDIR 0.1 01/27/2017 0844      Component Value Date/Time   TSH 1.36 06/21/2024 0000    Nutritional Lab Results  Component Value Date   VD25OH 44 06/21/2024   VD25OH 19.0 (L) 10/25/2023   VD25OH 27 (L) 08/24/2022    Medications: Outpatient Encounter Medications as of 07/31/2024  Medication Sig   cetirizine  (ZYRTEC ) 10 MG tablet TAKE 1 TABLET BY MOUTH EVERY DAY   ibuprofen  (ADVIL ) 800 MG tablet TAKE 1 TABLET BY MOUTH EVERY 8 HOURS AS NEEDED   LINZESS  145 MCG CAPS capsule Take 1  capsule (145 mcg total) by mouth daily before breakfast.   metFORMIN  (GLUCOPHAGE -XR) 500 MG 24 hr tablet Take 1 tablet (500 mg total) by mouth 2 (two) times daily with a meal.   MIRENA, 52 MG, 20 MCG/24HR IUD    pantoprazole  (PROTONIX ) 40 MG tablet Take 1 tablet (40 mg total) by mouth 2 (two) times daily.   SUMAtriptan  (IMITREX ) 100 MG tablet TAKE 1 TABLET (100 MG TOTAL) BY MOUTH EVERY 2 (TWO) HOURS AS NEEDED FOR MIGRAINE. MAY REPEAT IN 2 HOURS IF HEADACHE PERSISTS OR RECURS.   topiramate  (TOPAMAX ) 25 MG tablet Take 1 tablet (25 mg total) by mouth at bedtime.   traZODone  (DESYREL ) 50 MG tablet Take 100 mg by mouth at bedtime as needed.   venlafaxine  XR (EFFEXOR -XR) 150 MG 24 hr capsule Take 1 capsule (150 mg total) by mouth daily with breakfast.   [DISCONTINUED] phentermine  (ADIPEX-P ) 37.5 MG tablet Take 1 tablet (37.5 mg total) by mouth daily before breakfast.   phentermine  (ADIPEX-P ) 37.5 MG tablet Take 1 tablet (37.5 mg total) by mouth daily before breakfast.   [DISCONTINUED] benzonatate  (TESSALON ) 200 MG capsule Take 1 capsule (200 mg total) by mouth 3 (three) times daily as needed for cough.   [DISCONTINUED] predniSONE  (DELTASONE ) 10 MG tablet Take 6 tabs on day 1, 5 tabs on day 2, 4 tabs on day 3, 3 tabs on day 4, 2 tabs on day 5, 1 tab on day 6   [DISCONTINUED] promethazine -dextromethorphan (PROMETHAZINE -DM) 6.25-15 MG/5ML syrup Take 5 mLs by mouth 4 (four) times daily as needed.   No facility-administered encounter medications on file as of 07/31/2024.     Follow-Up   No follow-ups on  file.SABRA She was informed of the importance of frequent follow up visits to maximize her success with intensive lifestyle modifications for her multiple health conditions.  Attestation Statement   Reviewed by clinician on day of visit: allergies, medications, problem list, medical history, surgical history, family history, social history, and previous encounter notes.     Lucas Parker, MD

## 2024-07-31 NOTE — Assessment & Plan Note (Signed)
 I reviewed sleep study from 2020 she has severe OSA particularly in the supine position with an AHI of 56.  She is currently on PAP therapy.  Losing 15% of body weight may reduce AHI and improve outcomes.  Insurance denied coverage for GLP-1 recently

## 2024-07-31 NOTE — Assessment & Plan Note (Signed)
 Most recent A1c is  Lab Results  Component Value Date   HGBA1C 5.6 06/21/2024   HGBA1C 5.3 10/16/2015    Has improved previous levels were above 5.7.  She is on metformin  for pharmacoprophylaxis without any adverse effects.  Her insurance denied coverage for GLP-1.

## 2024-07-31 NOTE — Assessment & Plan Note (Signed)
 She has lost 30 pounds, which is 13.4% of her body weight over nine months, indicating a good response to her current weight management plan. She is following a 1200 calorie nutrition plan 80% of the time and exercises four days a week for 15-20 minutes. Her body fat percentage has decreased to 40%, with a goal of under 34%. Muscle mass has increased, and visceral fat rating is down to 9, indicating improvements in both subcutaneous and visceral fat. The use of compounded peptides was discussed, highlighting safety concerns due to lack of regulation and potential contaminants, leading to adverse effects. The FDA does not endorse compounded peptides due to these safety concerns. - Continue current nutrition and exercise plan - Hold phentermine  for three days prior to surgery due to anesthesia - Continue metformin , phentermine  and topiramate  for weight management and abnormal hunger.

## 2024-08-03 ENCOUNTER — Encounter (INDEPENDENT_AMBULATORY_CARE_PROVIDER_SITE_OTHER): Payer: Self-pay | Admitting: Internal Medicine

## 2024-08-05 ENCOUNTER — Other Ambulatory Visit (INDEPENDENT_AMBULATORY_CARE_PROVIDER_SITE_OTHER): Payer: Self-pay | Admitting: Internal Medicine

## 2024-08-05 ENCOUNTER — Other Ambulatory Visit (INDEPENDENT_AMBULATORY_CARE_PROVIDER_SITE_OTHER): Payer: Self-pay

## 2024-08-05 DIAGNOSIS — E66811 Obesity, class 1: Secondary | ICD-10-CM

## 2024-08-05 NOTE — Telephone Encounter (Signed)
 Please approve this or send to [facility] in [place], thank you

## 2024-08-10 ENCOUNTER — Encounter: Payer: Self-pay | Admitting: Internal Medicine

## 2024-08-12 MED ORDER — VENLAFAXINE HCL ER 150 MG PO CP24: 150.0000 mg | ORAL_CAPSULE | Freq: Every day | ORAL | 1 refills | Status: AC

## 2024-08-27 ENCOUNTER — Encounter (INDEPENDENT_AMBULATORY_CARE_PROVIDER_SITE_OTHER): Payer: Self-pay | Admitting: Internal Medicine

## 2024-08-27 ENCOUNTER — Ambulatory Visit (INDEPENDENT_AMBULATORY_CARE_PROVIDER_SITE_OTHER): Admitting: Internal Medicine

## 2024-08-27 VITALS — BP 94/66 | HR 111 | Temp 98.8°F | Ht 66.0 in | Wt 185.0 lb

## 2024-08-27 DIAGNOSIS — Z96642 Presence of left artificial hip joint: Secondary | ICD-10-CM | POA: Insufficient documentation

## 2024-08-27 DIAGNOSIS — E66811 Obesity, class 1: Secondary | ICD-10-CM | POA: Diagnosis not present

## 2024-08-27 DIAGNOSIS — K5909 Other constipation: Secondary | ICD-10-CM | POA: Diagnosis not present

## 2024-08-27 DIAGNOSIS — Z6833 Body mass index (BMI) 33.0-33.9, adult: Secondary | ICD-10-CM

## 2024-08-27 DIAGNOSIS — R7303 Prediabetes: Secondary | ICD-10-CM | POA: Diagnosis not present

## 2024-08-27 NOTE — Assessment & Plan Note (Signed)
 Significant postoperative pain following left hip replacement surgery on August 14, 2024. Pain management includes tramadol, Tylenol , and ibuprofen . Difficulty sleeping due to pain and discomfort. Monitor ibuprofen  use due to potential side effects such as bleeding, ulcers, and kidney problems. - Continue tramadol, Tylenol , and ibuprofen  for pain management - Advise taking ibuprofen  with food and monitoring for potential side effects - Encourage use of a foam wedge for sleeping to maintain hip alignment - Advise continued physical therapy and home exercisesPatient is not experiencing right-sided hip pain pretty significant.  She is taking tramadol and high-dose ibuprofen .  Counseled on safety concerns regarding the use of high-dose ibuprofen .  She will follow-up with orthopedic surgery today.  PPI for prophylaxis.

## 2024-08-27 NOTE — Assessment & Plan Note (Signed)
 Most recent A1c is  Lab Results  Component Value Date   HGBA1C 5.6 06/21/2024   HGBA1C 5.3 10/16/2015    Has improved previous levels were above 5.7.  She is on metformin  for pharmacoprophylaxis without any adverse effects.  Metformin  along with nutrition and behavioral strategies.

## 2024-08-27 NOTE — Assessment & Plan Note (Signed)
 Severe constipation following left hip replacement surgery, exacerbated by opioid use and reduced mobility. No bowel movement since surgery despite Linzess , docusate, and Miralax. Phentermine  held as it can contribute to constipation. - Administer two servings of Miralax daily - Take one Dulcolax pill daily - Use glycerin or Dulcolax suppository if needed - Encourage increased water  intake - Advise gentle abdominal massage and increased physical activity to stimulate bowel movement - Consider magnesium citrate as a last resort if other measures fail

## 2024-08-27 NOTE — Assessment & Plan Note (Signed)
 Continued weight loss with an 11-pound reduction since the last visit. Following a 1200-calorie nutrition plan without exercise. Phentermine  held due to constipation concerns. - Hold phentermine  until constipation resolves - Continue topiramate  - Continue metformin  - Encourage resumption of regular eating to prevent muscle loss and support healing

## 2024-08-27 NOTE — Progress Notes (Signed)
 Office: [phone]  /  Fax: [phone]  Weight Summary and Body Composition Analysis (BIA)  Vitals Temp: 98.8 F (37.1 C) BP: 94/66 Pulse Rate: (!) 111 SpO2: 99 %   Anthropometric Measurements Height: 5' 6 (1.676 m) Weight: 185 lb (83.9 kg) BMI (Calculated): 29.87 Weight at Last Visit: 196lb Weight Lost Since Last Visit: 11lb Weight Gained Since Last Visit: 0lb Starting Weight: 217lb Total Weight Loss (lbs): 32 lb (14.5 kg) Peak Weight: 226lb   Body Composition  Body Fat %: 42.4 % Fat Mass (lbs): 78.8 lbs Muscle Mass (lbs): 101.6 lbs Total Body Water  (lbs): 70.8 lbs Visceral Fat Rating : 9    RMR: 1987  Today's Visit #: 11  Starting Date: 11/24/22   Subjective   Chief Complaint: Obesity  Interval History Discussed the use of AI scribe software for clinical note transcription with the patient, who gave verbal consent to proceed.  History of Present Illness Martha Page is a 43 year old female who presents for medical weight management. She is accompanied by her husband, Chyrl.  Since her last office visit, she has lost eleven pounds while adhering to a twelve hundred calorie nutrition plan. She has not been exercising due to recent hip replacement surgery.  She underwent hip replacement surgery on October 8th, which was complicated by severe postoperative pain and nausea, requiring maximum pain and nausea medications. She received a blood transfusion due to low blood pressure and hemoglobin levels, which were 7.9 before transfusion and 9.1 after. She has ongoing constipation since the day of surgery, despite taking Linzess , docusate, and Miralax. She has also used an enema but has not yet administered it due to difficulty finding a comfortable position. She is currently taking tramadol, Tylenol , and ibuprofen  for pain management.  She has experienced a decreased appetite, attributing it to pain medications, and has continued taking weight loss  medications, except for the day of and the day after surgery. She continued taking weight loss medications except for the day of and the day after surgery, and continues with topiramate  and metformin .  She reports new onset right hip pain, which is not the hip that was operated on, and attributes it to sleeping positions post-surgery. She is undergoing physical therapy once a week and performing leg stretches at home. No issues with the prosthesis itself but difficulty with sleep due to pain and positioning post-surgery. She uses a foam wedge for support.  She reports a change in her vision since the day of surgery, requiring her to hold objects further away to see them clearly. She has an upcoming appointment with her eye doctor next month.     Challenges affecting patient progress: orthopedic problems, medical conditions or chronic pain affecting mobility.    Pharmacotherapy for weight management: She is currently taking Topiramate  (off label use, single agent) with adequate clinical response  and without side effects. and Phentermine  (longterm use, off-label, single agent)  with adequate clinical response  and without side effects..   Assessment and Plan   Treatment Plan For Obesity:  Recommended Dietary Goals  Raliyah is currently in the action stage of change. As such, her goal is to continue weight management plan. She has agreed to: continue current plan  Behavioral Health and Counseling  We discussed the following behavioral modification strategies today: continue to work on maintaining a reduced calorie state, getting the recommended amount of protein, incorporating whole foods, making healthy choices, staying well hydrated and practicing mindfulness when eating. and increase protein  intake, fibrous foods (25 grams per day for women, 30 grams for men) and water  to improve satiety and decrease hunger signals. .  Additional education and resources provided today: None  Recommended  Physical Activity Goals  Chudney has been advised to work up to 150 minutes of moderate intensity aerobic activity a week and strengthening exercises 2-3 times per week for cardiovascular health, weight loss maintenance and preservation of muscle mass.  She has agreed to :  Increase volume of physical activity to a goal of 240 minutes a week and Combine aerobic and strengthening exercises for efficiency and improved cardiometabolic health.  Medical Interventions and Pharmacotherapy  We discussed various medication options to help Anthony with her weight loss efforts and we both agreed to : Patient advised to hold phentermine  for the next 7 days because of problems with constipation postoperatively  Associated Conditions Impacted by Obesity Treatment  Assessment & Plan Class 1 obesity with serious comorbidity and body mass index (BMI) of 33.0 to 33.9 in adult, unspecified obesity type Continued weight loss with an 11-pound reduction since the last visit. Following a 1200-calorie nutrition plan without exercise. Phentermine  held due to constipation concerns. - Hold phentermine  until constipation resolves - Continue topiramate  - Continue metformin  - Encourage resumption of regular eating to prevent muscle loss and support healing Prediabetes Most recent A1c is  Lab Results  Component Value Date   HGBA1C 5.6 06/21/2024   HGBA1C 5.3 10/16/2015    Has improved previous levels were above 5.7.  She is on metformin  for pharmacoprophylaxis without any adverse effects.  Metformin  along with nutrition and behavioral strategies.  Chronic constipation Severe constipation following left hip replacement surgery, exacerbated by opioid use and reduced mobility. No bowel movement since surgery despite Linzess , docusate, and Miralax. Phentermine  held as it can contribute to constipation. - Administer two servings of Miralax daily - Take one Dulcolax pill daily - Use glycerin or Dulcolax suppository if  needed - Encourage increased water  intake - Advise gentle abdominal massage and increased physical activity to stimulate bowel movement - Consider magnesium citrate as a last resort if other measures fail History of left hip replacement Significant postoperative pain following left hip replacement surgery on August 14, 2024. Pain management includes tramadol, Tylenol , and ibuprofen . Difficulty sleeping due to pain and discomfort. Monitor ibuprofen  use due to potential side effects such as bleeding, ulcers, and kidney problems. - Continue tramadol, Tylenol , and ibuprofen  for pain management - Advise taking ibuprofen  with food and monitoring for potential side effects - Encourage use of a foam wedge for sleeping to maintain hip alignment - Advise continued physical therapy and home exercisesPatient is not experiencing right-sided hip pain pretty significant.  She is taking tramadol and high-dose ibuprofen .  Counseled on safety concerns regarding the use of high-dose ibuprofen .  She will follow-up with orthopedic surgery today.  PPI for prophylaxis.          Objective   Physical Exam:  Blood pressure 94/66, pulse (!) 111, temperature 98.8 F (37.1 C), height 5' 6 (1.676 m), weight 185 lb (83.9 kg), SpO2 99%. Body mass index is 29.86 kg/m.  General: She is overweight, cooperative, alert, well developed, and in no acute distress. PSYCH: Has normal mood, affect and thought process.   HEENT: EOMI, sclerae are anicteric. Lungs: Normal breathing effort, no conversational dyspnea. Extremities: No edema.  Neurologic: No gross sensory or motor deficits. No tremors or fasciculations noted.    Diagnostic Data Reviewed:  BMET    Component Value  Date/Time   NA 137 06/21/2024 0000   NA 137 11/06/2023 1328   NA 137 05/26/2014 1855   K 4.0 06/21/2024 0000   K 3.3 (L) 05/26/2014 1855   CL 108 06/21/2024 0000   CL 105 05/26/2014 1855   CO2 21 06/21/2024 0000   CO2 20 (L) 05/26/2014 1855    GLUCOSE 79 06/21/2024 0000   GLUCOSE 131 (H) 05/26/2014 1855   BUN 13 06/21/2024 0000   BUN 13 11/06/2023 1328   BUN 5 (L) 05/26/2014 1855   CREATININE 0.71 06/21/2024 0000   CALCIUM  8.8 06/21/2024 0000   CALCIUM  8.4 (L) 05/26/2014 1855   GFRNONAA >60 01/24/2018 1543   GFRNONAA >60 05/26/2014 1855   GFRAA >60 01/24/2018 1543   GFRAA >60 05/26/2014 1855   Lab Results  Component Value Date   HGBA1C 5.6 06/21/2024   HGBA1C 5.3 10/16/2015   Lab Results  Component Value Date   INSULIN  11.0 10/25/2023   Lab Results  Component Value Date   TSH 1.36 06/21/2024   CBC    Component Value Date/Time   WBC 8.6 06/21/2024 0000   RBC 4.30 06/21/2024 0000   HGB 12.7 06/21/2024 0000   HGB 13.5 12/02/2021 1557   HCT 37.6 06/21/2024 0000   HCT 39.5 12/02/2021 1557   PLT 148 06/21/2024 0000   PLT 227 12/02/2021 1557   MCV 87.4 06/21/2024 0000   MCV 83 12/02/2021 1557   MCV 87 05/26/2014 1855   MCH 29.5 06/21/2024 0000   MCHC 33.8 06/21/2024 0000   RDW 13.4 06/21/2024 0000   RDW 13.5 12/02/2021 1557   RDW 18.2 (H) 05/26/2014 1855   Iron Studies    Component Value Date/Time   IRON 63 11/06/2023 1328   TIBC 276 11/06/2023 1328   FERRITIN 14 (L) 06/21/2024 0000   FERRITIN 14 (L) 11/06/2023 1328   IRONPCTSAT 23 11/06/2023 1328   Lipid Panel     Component Value Date/Time   CHOL 169 06/21/2024 0000   TRIG 114 06/21/2024 0000   HDL 47 (L) 06/21/2024 0000   CHOLHDL 3.6 06/21/2024 0000   VLDL 13.4 10/16/2015 0910   LDLCALC 101 (H) 06/21/2024 0000   Hepatic Function Panel     Component Value Date/Time   PROT 6.9 06/21/2024 0000   PROT 7.0 11/06/2023 1328   PROT 7.5 12/02/2013 1017   ALBUMIN 4.2 11/06/2023 1328   ALBUMIN 3.4 12/02/2013 1017   AST 29 06/21/2024 0000   AST 21 12/02/2013 1017   ALT 51 (H) 06/21/2024 0000   ALT 49 12/02/2013 1017   ALKPHOS 133 (H) 11/06/2023 1328   ALKPHOS 90 12/02/2013 1017   BILITOT 0.3 06/21/2024 0000   BILITOT 0.3 11/06/2023 1328    BILITOT 0.3 12/02/2013 1017   BILIDIR 0.1 01/27/2017 0844      Component Value Date/Time   TSH 1.36 06/21/2024 0000   Nutritional Lab Results  Component Value Date   VD25OH 44 06/21/2024   VD25OH 19.0 (L) 10/25/2023   VD25OH 27 (L) 08/24/2022    Medications: Outpatient Encounter Medications as of 08/27/2024  Medication Sig   cetirizine  (ZYRTEC ) 10 MG tablet TAKE 1 TABLET BY MOUTH EVERY DAY   ibuprofen  (ADVIL ) 800 MG tablet TAKE 1 TABLET BY MOUTH EVERY 8 HOURS AS NEEDED   LINZESS  145 MCG CAPS capsule Take 1 capsule (145 mcg total) by mouth daily before breakfast.   metFORMIN  (GLUCOPHAGE -XR) 500 MG 24 hr tablet Take 1 tablet (500 mg total) by mouth 2 (two)  times daily with a meal.   MIRENA, 52 MG, 20 MCG/24HR IUD    pantoprazole  (PROTONIX ) 40 MG tablet Take 1 tablet (40 mg total) by mouth 2 (two) times daily.   phentermine  (ADIPEX-P ) 37.5 MG tablet TAKE 1 TABLET BY MOUTH DAILY BEFORE BREAKFAST   SUMAtriptan  (IMITREX ) 100 MG tablet TAKE 1 TABLET (100 MG TOTAL) BY MOUTH EVERY 2 (TWO) HOURS AS NEEDED FOR MIGRAINE. MAY REPEAT IN 2 HOURS IF HEADACHE PERSISTS OR RECURS.   topiramate  (TOPAMAX ) 25 MG tablet Take 1 tablet (25 mg total) by mouth at bedtime.   traZODone  (DESYREL ) 50 MG tablet Take 100 mg by mouth at bedtime as needed.   venlafaxine  XR (EFFEXOR -XR) 150 MG 24 hr capsule Take 1 capsule (150 mg total) by mouth daily with breakfast.   No facility-administered encounter medications on file as of 08/27/2024.     Follow-Up   Return in about 4 weeks (around 09/24/2024).SABRA She was informed of the importance of frequent follow up visits to maximize her success with intensive lifestyle modifications for her multiple health conditions.  Attestation Statement   Reviewed by clinician on day of visit: allergies, medications, problem list, medical history, surgical history, family history, social history, and previous encounter notes.     Lucas Parker, MD

## 2024-09-01 ENCOUNTER — Encounter: Payer: Self-pay | Admitting: Internal Medicine

## 2024-09-02 MED ORDER — TRAZODONE HCL 50 MG PO TABS: 100.0000 mg | ORAL_TABLET | Freq: Every evening | ORAL | 0 refills | Status: DC | PRN

## 2024-09-02 MED ORDER — TRAZODONE HCL 50 MG PO TABS: 50.0000 mg | ORAL_TABLET | Freq: Every evening | ORAL | 0 refills | Status: AC | PRN

## 2024-09-08 ENCOUNTER — Encounter (INDEPENDENT_AMBULATORY_CARE_PROVIDER_SITE_OTHER): Payer: Self-pay | Admitting: Internal Medicine

## 2024-09-09 ENCOUNTER — Other Ambulatory Visit (INDEPENDENT_AMBULATORY_CARE_PROVIDER_SITE_OTHER): Payer: Self-pay | Admitting: Internal Medicine

## 2024-09-09 DIAGNOSIS — E66811 Obesity, class 1: Secondary | ICD-10-CM

## 2024-09-09 MED ORDER — PHENTERMINE HCL 37.5 MG PO TABS: 37.5000 mg | ORAL_TABLET | Freq: Every day | ORAL | 0 refills | Status: DC

## 2024-09-09 NOTE — Progress Notes (Signed)
Patient due for refill

## 2024-09-11 ENCOUNTER — Ambulatory Visit: Payer: 59 | Admitting: Dermatology

## 2024-09-16 ENCOUNTER — Ambulatory Visit: Admitting: Neurology

## 2024-09-24 ENCOUNTER — Encounter (INDEPENDENT_AMBULATORY_CARE_PROVIDER_SITE_OTHER): Payer: Self-pay | Admitting: Internal Medicine

## 2024-09-24 ENCOUNTER — Ambulatory Visit (INDEPENDENT_AMBULATORY_CARE_PROVIDER_SITE_OTHER): Payer: Self-pay | Admitting: Internal Medicine

## 2024-09-24 VITALS — BP 90/63 | HR 100 | Temp 97.8°F | Ht 66.0 in | Wt 172.0 lb

## 2024-09-24 DIAGNOSIS — G4733 Obstructive sleep apnea (adult) (pediatric): Secondary | ICD-10-CM | POA: Diagnosis not present

## 2024-09-24 DIAGNOSIS — K760 Fatty (change of) liver, not elsewhere classified: Secondary | ICD-10-CM | POA: Diagnosis not present

## 2024-09-24 DIAGNOSIS — R638 Other symptoms and signs concerning food and fluid intake: Secondary | ICD-10-CM | POA: Diagnosis not present

## 2024-09-24 DIAGNOSIS — E669 Obesity, unspecified: Secondary | ICD-10-CM

## 2024-09-24 DIAGNOSIS — R7303 Prediabetes: Secondary | ICD-10-CM | POA: Diagnosis not present

## 2024-09-24 DIAGNOSIS — Z6827 Body mass index (BMI) 27.0-27.9, adult: Secondary | ICD-10-CM | POA: Insufficient documentation

## 2024-09-24 MED ORDER — PHENTERMINE HCL 37.5 MG PO TABS: 37.5000 mg | ORAL_TABLET | Freq: Every day | ORAL | 0 refills | Status: DC

## 2024-09-24 MED ORDER — METFORMIN HCL ER 500 MG PO TB24: 500.0000 mg | ORAL_TABLET | Freq: Every day | ORAL | Status: DC

## 2024-09-24 NOTE — Assessment & Plan Note (Signed)
 Prediabetes Currently on metformin  500 mg twice daily. Reports episodes of hypoglycemia and nausea, possibly related to metformin . Discussed reducing metformin  to once daily with food to mitigate side effects while maintaining glucose control. - Reduced metformin  to 500 mg once daily with food. - Monitor for symptoms of hypoglycemia and adjust treatment if necessary.  Obstructive sleep apnea Severe obstructive sleep apnea, currently using CPAP. Significant weight loss may warrant reevaluation of CPAP necessity. Discussed potential for repeat sleep study to assess current need for CPAP therapy. - Continue CPAP therapy. - Will consider repeat sleep study to evaluate current need for CPAP.  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 Discussed potential benefits of metformin  on liver health. - Continue metformin  therapy.  Abnormal food and fluid intake with altered appetite and nutrition Reports altered taste and dry mouth, affecting fluid and food intake. Likely related to topiramate  and possibly metformin . Inadequate nutrition post-surgery, leading to muscle loss and fatigue. Emphasized importance of refeeding and increasing caloric intake to restore muscle mass. - Taper off topiramate  as planned. - Increase caloric intake to 1300-1400 calories per day. - Ensure adequate protein intake to support muscle recovery. - Monitor for improvement in taste and appetite.

## 2024-09-24 NOTE — Assessment & Plan Note (Signed)
 Weight: decrease of 54.3 lb (24%) over 11 months, 1 week  Start: 10/12/2023 226 lb 4.8 oz (102.6 kg)  End: 09/24/2024 172 lb (78 kg)  Significant weight loss of 13 pounds since last visit, now at BMI 27. Current weight management includes phentermine  and topiramate . Reports altered taste and dry mouth, likely due to topiramate . High-dose ibuprofen  use for post-surgical pain poses risk of gastrointestinal bleeding and kidney failure. Emphasized importance of hydration and reducing ibuprofen  dose to minimize risks. - Taper off topiramate  by taking 25 mg every other day for 8 days, then stop. - Reduce ibuprofen  dose to 400 mg and combine with Tylenol  for pain management. - Increase caloric intake to 1300-1400 calories per day for refeeding phase. - Encourage hydration and adequate protein intake to rebuild muscle mass. - Consider using a recumbent bike to improve strength and muscle mass.

## 2024-09-24 NOTE — Progress Notes (Signed)
 Office: [phone]  /  Fax: [phone]  Weight Summary and Body Composition Analysis (BIA)  Vitals Temp: 97.8 F (36.6 C) BP: 90/63 Pulse Rate: 100 SpO2: 98 %   Anthropometric Measurements Height: 5' 6 (1.676 m) Weight: 172 lb (78 kg) BMI (Calculated): 27.77 Weight at Last Visit: 185 lb Weight Lost Since Last Visit: 13 lb Weight Gained Since Last Visit: 0 lb Starting Weight: 217 lb Total Weight Loss (lbs): 45 lb (20.4 kg) Peak Weight: 226 lb   Body Composition  Body Fat %: 41 % Fat Mass (lbs): 70.8 lbs Muscle Mass (lbs): 96.8 lbs Total Body Water  (lbs): 67.2 lbs Visceral Fat Rating : 8    RMR: 1987  Today's Visit #: 12  Starting Date: 11/24/22   Subjective   Chief Complaint: Obesity  Interval History Discussed the use of AI scribe software for clinical note transcription with the patient, who gave verbal consent to proceed.  History of Present Illness Martha Page is a 43 year old female with prediabetes and MASLD who presents for medical weight management.  She has been on phentermine  and topiramate  for weight management, along with metformin  500 mg twice daily for prediabetes. Since her last visit, she has lost 13 pounds and follows a 1200 calorie nutrition plan about 50% of the time. She has not been exercising.  She experiences altered taste and dry mouth, which she attributes to topiramate  and phentermine , making it difficult to stay hydrated as water  and other drinks often taste unpleasant. She also experiences nausea, similar to her experience during pregnancy, and occasionally uses Zofran  to manage it.  She has been experiencing pain in her leg post-surgery and takes ibuprofen  800 mg twice daily. She mentions numbness and pain in her leg, particularly at night and when sitting for long periods, affecting her sleep and daily activities.  She reports episodes of low blood sugar, feeling unwell, and needing to sit down to avoid falls due to  concerns about her hip.  She has sleep apnea and continues to use her CPAP machine. She mentions a significant weight loss from a BMI of 35 to 27, which she attributes to multiple factors including surgery and medication. Despite the weight loss, she is concerned about muscle loss and decreased stamina, impacting her ability to engage in activities she previously enjoyed.     Challenges affecting patient progress: orthopedic problems, medical conditions or chronic pain affecting mobility.    Pharmacotherapy for weight management: She is currently taking Metformin  with diabetes as primary indication with adequate clinical response  and without side effects., Topiramate  (off label use, single agent) with adequate clinical response  and experiencing the following side effects: altered, and Phentermine  (longterm use, off-label, single agent)  with adequate clinical response  and without side effects..   Assessment and Plan   Treatment Plan For Obesity:  Recommended Dietary Goals  Martha Page is currently in the action stage of change. As such, her goal is to continue weight management plan. She has agreed to: Increase calories 1300-1400  Behavioral Health and Counseling  We discussed the following behavioral modification strategies today: focus of refeeding next two months due to loss of muscle mass.  Additional education and resources provided today: Handout on traveling and holiday eating strategies  Recommended Physical Activity Goals  Martha Page has been advised to work up to 150 minutes of moderate intensity aerobic activity a week and strengthening exercises 2-3 times per week for cardiovascular health, weight loss maintenance and preservation of muscle mass.  She has agreed to :  Think about enjoyable ways to increase daily physical activity and overcoming barriers to exercise, Increase physical activity in their day and reduce sedentary time (increase NEAT)., and Increase and monitor steps  for a goal of 10,000 per day  Medical Interventions and Pharmacotherapy  We discussed various medication options to help Martha Page with her weight loss efforts and we both agreed to : taper off topiramate  over 8 days, reduce metformin  to once a day, continue phentermine  at current dose.   Associated Conditions Impacted by Obesity Treatment  Assessment & Plan OSA (obstructive sleep apnea) Metabolic dysfunction-associated steatotic liver disease (MASLD) Prediabetes Abnormal food appetite Prediabetes Currently on metformin  500 mg twice daily. Reports episodes of hypoglycemia and nausea, possibly related to metformin . Discussed reducing metformin  to once daily with food to mitigate side effects while maintaining glucose control. - Reduced metformin  to 500 mg once daily with food. - Monitor for symptoms of hypoglycemia and adjust treatment if necessary.  Obstructive sleep apnea Severe obstructive sleep apnea, currently using CPAP. Significant weight loss may warrant reevaluation of CPAP necessity. Discussed potential for repeat sleep study to assess current need for CPAP therapy. - Continue CPAP therapy. - Will consider repeat sleep study to evaluate current need for CPAP.  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 Discussed potential benefits of metformin  on liver health. - Continue metformin  therapy.  Abnormal food and fluid intake with altered appetite and nutrition Reports altered taste and dry mouth, affecting fluid and food intake. Likely related to topiramate  and possibly metformin . Inadequate nutrition post-surgery, leading to muscle loss and fatigue. Emphasized importance of refeeding and increasing caloric intake to restore muscle mass. - Taper off topiramate  as planned. - Increase caloric intake to 1300-1400 calories per day. - Ensure adequate protein intake to support muscle recovery. - Monitor for improvement in  taste and appetite. Generalized obesity - starting BMI 35 BMI 27.0-27.9,adult Weight: decrease of 54.3 lb (24%) over 11 months, 1 week  Start: 10/12/2023 226 lb 4.8 oz (102.6 kg)  End: 09/24/2024 172 lb (78 kg)  Significant weight loss of 13 pounds since last visit, now at BMI 27. Current weight management includes phentermine  and topiramate . Reports altered taste and dry mouth, likely due to topiramate . High-dose ibuprofen  use for post-surgical pain poses risk of gastrointestinal bleeding and kidney failure. Emphasized importance of hydration and reducing ibuprofen  dose to minimize risks. - Taper off topiramate  by taking 25 mg every other day for 8 days, then stop. - Reduce ibuprofen  dose to 400 mg and combine with Tylenol  for pain management. - Increase caloric intake to 1300-1400 calories per day for refeeding phase. - Encourage hydration and adequate protein intake to rebuild muscle mass. - Consider using a recumbent bike to improve strength and muscle mass.        Objective   Physical Exam:  Blood pressure 90/63, pulse 100, temperature 97.8 F (36.6 C), height 5' 6 (1.676 m), weight 172 lb (78 kg), SpO2 98%. Body mass index is 27.76 kg/m.  General: She is overweight, cooperative, alert, well developed, and in no acute distress. PSYCH: Has normal mood, affect and thought process.   HEENT: EOMI, sclerae are anicteric. Lungs: Normal breathing effort, no conversational dyspnea. Extremities: No edema.  Neurologic: No gross sensory or motor deficits. No tremors or fasciculations noted.    Diagnostic Data Reviewed:  BMET    Component Value Date/Time   NA 137 06/21/2024 0000   NA 137 11/06/2023 1328  NA 137 05/26/2014 1855   K 4.0 06/21/2024 0000   K 3.3 (L) 05/26/2014 1855   CL 108 06/21/2024 0000   CL 105 05/26/2014 1855   CO2 21 06/21/2024 0000   CO2 20 (L) 05/26/2014 1855   GLUCOSE 79 06/21/2024 0000   GLUCOSE 131 (H) 05/26/2014 1855   BUN 13 06/21/2024 0000   BUN  13 11/06/2023 1328   BUN 5 (L) 05/26/2014 1855   CREATININE 0.71 06/21/2024 0000   CALCIUM  8.8 06/21/2024 0000   CALCIUM  8.4 (L) 05/26/2014 1855   GFRNONAA >60 01/24/2018 1543   GFRNONAA >60 05/26/2014 1855   GFRAA >60 01/24/2018 1543   GFRAA >60 05/26/2014 1855   Lab Results  Component Value Date   HGBA1C 5.6 06/21/2024   HGBA1C 5.3 10/16/2015   Lab Results  Component Value Date   INSULIN  11.0 10/25/2023   Lab Results  Component Value Date   TSH 1.36 06/21/2024   CBC    Component Value Date/Time   WBC 8.6 06/21/2024 0000   RBC 4.30 06/21/2024 0000   HGB 12.7 06/21/2024 0000   HGB 13.5 12/02/2021 1557   HCT 37.6 06/21/2024 0000   HCT 39.5 12/02/2021 1557   PLT 148 06/21/2024 0000   PLT 227 12/02/2021 1557   MCV 87.4 06/21/2024 0000   MCV 83 12/02/2021 1557   MCV 87 05/26/2014 1855   MCH 29.5 06/21/2024 0000   MCHC 33.8 06/21/2024 0000   RDW 13.4 06/21/2024 0000   RDW 13.5 12/02/2021 1557   RDW 18.2 (H) 05/26/2014 1855   Iron Studies    Component Value Date/Time   IRON 63 11/06/2023 1328   TIBC 276 11/06/2023 1328   FERRITIN 14 (L) 06/21/2024 0000   FERRITIN 14 (L) 11/06/2023 1328   IRONPCTSAT 23 11/06/2023 1328   Lipid Panel     Component Value Date/Time   CHOL 169 06/21/2024 0000   TRIG 114 06/21/2024 0000   HDL 47 (L) 06/21/2024 0000   CHOLHDL 3.6 06/21/2024 0000   VLDL 13.4 10/16/2015 0910   LDLCALC 101 (H) 06/21/2024 0000   Hepatic Function Panel     Component Value Date/Time   PROT 6.9 06/21/2024 0000   PROT 7.0 11/06/2023 1328   PROT 7.5 12/02/2013 1017   ALBUMIN 4.2 11/06/2023 1328   ALBUMIN 3.4 12/02/2013 1017   AST 29 06/21/2024 0000   AST 21 12/02/2013 1017   ALT 51 (H) 06/21/2024 0000   ALT 49 12/02/2013 1017   ALKPHOS 133 (H) 11/06/2023 1328   ALKPHOS 90 12/02/2013 1017   BILITOT 0.3 06/21/2024 0000   BILITOT 0.3 11/06/2023 1328   BILITOT 0.3 12/02/2013 1017   BILIDIR 0.1 01/27/2017 0844      Component Value Date/Time    TSH 1.36 06/21/2024 0000   Nutritional Lab Results  Component Value Date   VD25OH 44 06/21/2024   VD25OH 19.0 (L) 10/25/2023   VD25OH 27 (L) 08/24/2022    Medications: Outpatient Encounter Medications as of 09/24/2024  Medication Sig   cetirizine  (ZYRTEC ) 10 MG tablet TAKE 1 TABLET BY MOUTH EVERY DAY   ibuprofen  (ADVIL ) 800 MG tablet TAKE 1 TABLET BY MOUTH EVERY 8 HOURS AS NEEDED   LINZESS  145 MCG CAPS capsule Take 1 capsule (145 mcg total) by mouth daily before breakfast.   metFORMIN  (GLUCOPHAGE -XR) 500 MG 24 hr tablet Take 1 tablet (500 mg total) by mouth 2 (two) times daily with a meal.   MIRENA, 52 MG, 20 MCG/24HR IUD  pantoprazole  (PROTONIX ) 40 MG tablet Take 1 tablet (40 mg total) by mouth 2 (two) times daily.   phentermine  (ADIPEX-P ) 37.5 MG tablet Take 1 tablet (37.5 mg total) by mouth daily before breakfast.   SUMAtriptan  (IMITREX ) 100 MG tablet TAKE 1 TABLET (100 MG TOTAL) BY MOUTH EVERY 2 (TWO) HOURS AS NEEDED FOR MIGRAINE. MAY REPEAT IN 2 HOURS IF HEADACHE PERSISTS OR RECURS.   topiramate  (TOPAMAX ) 25 MG tablet Take 1 tablet (25 mg total) by mouth at bedtime.   traZODone  (DESYREL ) 50 MG tablet Take 1 tablet (50 mg total) by mouth at bedtime as needed.   venlafaxine  XR (EFFEXOR -XR) 150 MG 24 hr capsule Take 1 capsule (150 mg total) by mouth daily with breakfast.   No facility-administered encounter medications on file as of 09/24/2024.     Follow-Up   No follow-ups on file.SABRA She was informed of the importance of frequent follow up visits to maximize her success with intensive lifestyle modifications for her multiple health conditions.  Attestation Statement   Reviewed by clinician on day of visit: allergies, medications, problem list, medical history, surgical history, family history, social history, and previous encounter notes.     Lucas Parker, MD

## 2024-09-25 ENCOUNTER — Encounter (INDEPENDENT_AMBULATORY_CARE_PROVIDER_SITE_OTHER): Payer: Self-pay | Admitting: Internal Medicine

## 2024-10-10 ENCOUNTER — Other Ambulatory Visit (INDEPENDENT_AMBULATORY_CARE_PROVIDER_SITE_OTHER): Payer: Self-pay | Admitting: Internal Medicine

## 2024-10-10 ENCOUNTER — Encounter (INDEPENDENT_AMBULATORY_CARE_PROVIDER_SITE_OTHER): Payer: Self-pay | Admitting: Internal Medicine

## 2024-10-10 DIAGNOSIS — Z6833 Body mass index (BMI) 33.0-33.9, adult: Secondary | ICD-10-CM

## 2024-10-12 ENCOUNTER — Other Ambulatory Visit (INDEPENDENT_AMBULATORY_CARE_PROVIDER_SITE_OTHER): Payer: Self-pay | Admitting: Internal Medicine

## 2024-10-12 DIAGNOSIS — E669 Obesity, unspecified: Secondary | ICD-10-CM

## 2024-10-12 DIAGNOSIS — R638 Other symptoms and signs concerning food and fluid intake: Secondary | ICD-10-CM

## 2024-10-14 ENCOUNTER — Other Ambulatory Visit (INDEPENDENT_AMBULATORY_CARE_PROVIDER_SITE_OTHER): Payer: Self-pay

## 2024-10-14 DIAGNOSIS — R7303 Prediabetes: Secondary | ICD-10-CM

## 2024-10-14 DIAGNOSIS — R638 Other symptoms and signs concerning food and fluid intake: Secondary | ICD-10-CM

## 2024-10-14 MED ORDER — TOPIRAMATE 25 MG PO TABS: 25.0000 mg | ORAL_TABLET | Freq: Every day | ORAL | 0 refills | Status: DC

## 2024-10-14 MED ORDER — METFORMIN HCL ER 500 MG PO TB24: 500.0000 mg | ORAL_TABLET | Freq: Every day | ORAL | 0 refills | Status: DC

## 2024-10-21 ENCOUNTER — Other Ambulatory Visit (INDEPENDENT_AMBULATORY_CARE_PROVIDER_SITE_OTHER): Payer: Self-pay | Admitting: Internal Medicine

## 2024-10-21 ENCOUNTER — Other Ambulatory Visit: Payer: Self-pay | Admitting: Internal Medicine

## 2024-10-21 DIAGNOSIS — R7303 Prediabetes: Secondary | ICD-10-CM

## 2024-10-21 DIAGNOSIS — R638 Other symptoms and signs concerning food and fluid intake: Secondary | ICD-10-CM

## 2024-10-23 NOTE — Telephone Encounter (Signed)
 Rx 09/02/24 #90- too soon Requested Prescriptions  Pending Prescriptions Disp Refills   traZODone  (DESYREL ) 50 MG tablet [Pharmacy Med Name: TRAZODONE  TAB 50MG ] 90 tablet 0    Sig: TAKE 1 TABLET AT BEDTIME ASNEEDED     Psychiatry: Antidepressants - Serotonin Modulator Passed - 10/23/2024  1:45 PM      Passed - Completed PHQ-2 or PHQ-9 in the last 360 days      Passed - Valid encounter within last 6 months    Recent Outpatient Visits           3 months ago Post-viral cough syndrome   [place] [facility] [place], Angeline ORN, NP   4 months ago Pure hypercholesterolemia   [place] [facility] [place], [place] W, NP   8 months ago Rash and nonspecific skin eruption   [place] [facility] [place], Angeline ORN, NP   10 months ago Encounter for general adult medical examination with abnormal findings   [place] [facility] [place], Angeline ORN, NP       Future Appointments             In 1 month Hester Alm BROCKS, MD [facility]

## 2024-10-24 ENCOUNTER — Ambulatory Visit (INDEPENDENT_AMBULATORY_CARE_PROVIDER_SITE_OTHER): Payer: Self-pay | Admitting: Internal Medicine

## 2024-10-24 ENCOUNTER — Encounter (INDEPENDENT_AMBULATORY_CARE_PROVIDER_SITE_OTHER): Payer: Self-pay | Admitting: Internal Medicine

## 2024-10-24 VITALS — BP 94/68 | HR 96 | Temp 97.7°F | Ht 66.0 in | Wt 168.0 lb

## 2024-10-24 DIAGNOSIS — R638 Other symptoms and signs concerning food and fluid intake: Secondary | ICD-10-CM | POA: Diagnosis not present

## 2024-10-24 DIAGNOSIS — Z6827 Body mass index (BMI) 27.0-27.9, adult: Secondary | ICD-10-CM

## 2024-10-24 DIAGNOSIS — K760 Fatty (change of) liver, not elsewhere classified: Secondary | ICD-10-CM | POA: Diagnosis not present

## 2024-10-24 DIAGNOSIS — E669 Obesity, unspecified: Secondary | ICD-10-CM

## 2024-10-24 DIAGNOSIS — R7303 Prediabetes: Secondary | ICD-10-CM

## 2024-10-24 MED ORDER — METFORMIN HCL ER 500 MG PO TB24: 500.0000 mg | ORAL_TABLET | Freq: Every day | ORAL | 0 refills | Status: DC

## 2024-10-24 MED ORDER — TOPIRAMATE 25 MG PO TABS: 25.0000 mg | ORAL_TABLET | Freq: Every day | ORAL | 0 refills | Status: AC

## 2024-10-24 MED ORDER — PHENTERMINE HCL 37.5 MG PO TABS: 18.7500 mg | ORAL_TABLET | Freq: Every day | ORAL | 0 refills | Status: DC

## 2024-10-24 NOTE — Assessment & Plan Note (Signed)
 MASLD with elevated ALT levels. Recent GI evaluation . Liver scan to be repeated in six months. Discussed potential need for liver elastography to assess for fibrosis - Continue current GI management plan. - Will repeat liver scan in six months. - Will consider liver elastography to assess for fibrosis. - Consider GLP-1 -Continue to maintain a diet low in saturated fats and simple and added sugars

## 2024-10-24 NOTE — Assessment & Plan Note (Signed)
 Improved on topiramate , phentermine .  She has neurological and gastrointestinal phenotype and benefits from ongoing pharmacotherapy.  See above discussion regarding phentermine  use.

## 2024-10-24 NOTE — Progress Notes (Signed)
 Office: [phone]  /  Fax: [phone]  Weight Summary and Body Composition Analysis (BIA)  Vitals Temp: 97.7 F (36.5 C) BP: 94/68 Pulse Rate: 96 SpO2: 99 %   Anthropometric Measurements Height: 5' 6 (1.676 m) Weight: 168 lb (76.2 kg) BMI (Calculated): 27.13 Weight at Last Visit: 172 lb Weight Lost Since Last Visit: 4 lb Weight Gained Since Last Visit: 0 lb Starting Weight: 217 lb Total Weight Loss (lbs): 49 lb (22.2 kg) Peak Weight: 226 lb   Body Composition  Body Fat %: 39.7 % Fat Mass (lbs): 67 lbs Muscle Mass (lbs): 96.6 lbs Total Body Water  (lbs): 68 lbs Visceral Fat Rating : 7    RMR: 1987  Today's Visit #: 13  Starting Date: 11/24/22   Subjective   Chief Complaint: Obesity  Interval History  Discussed the use of AI scribe software for clinical note transcription with the patient, who gave verbal consent to proceed.  History of Present Illness Martha Page is a 43 year old female with prediabetes and MASLD who presents for medical weight management.  She has lost a total of 49 pounds, including a 4-pound loss since her last visit. She adheres to a 1200 calorie nutrition plan 80% of the time and engages in cardio exercise five days a week for 20 minutes each session. Her current medications for weight management include topiramate  and phentermine , along with metformin  for prediabetes. She uses a CPAP machine for sleep apnea.  She experiences numbness and pain in the crease of her hip, similar to previous episodes. She initially discontinued topiramate  due to decreased appetite but resumed it after experiencing increased cravings for sweets and soda.  Her heart rate has been elevated, with recent readings in her watch ranging from 95 to 130 bpm. She attributes this partly to stress from a recent emergency situation involving her mother, who had a stroke. She denies consuming decongestants or energy drinks and reports limited sleep, averaging  five to six hours per night. She monitors her heart rate using a watch, which consistently shows elevated readings.  She underwent a Miralax cleanse and had her Linzess  dose increased. Her liver function tests showed elevated ALT levels. She is scheduled for a repeat liver scan in six months.  She previously had significant weight gain during her pregnancy with [employer], leading to concerns about diastasis recti and abdominal wall laxity. She notes difficulty in tightening her abdominal muscles and is considering future surgical options for body contouring.     Challenges affecting patient progress: orthopedic problems, medical conditions or chronic pain affecting mobility.    Pharmacotherapy for weight management: She is currently taking Metformin  (off label use for weight management and / or insulin  resistance and / or diabetes prevention) with adequate clinical response  and without side effects., Topiramate  (off label use, single agent) with adequate clinical response  and without side effects., and Phentermine  (longterm use, off-label, single agent)  with adequate clinical response  and experiencing the following side effects: adrenergic symptoms and unsustained tachycardia.   Assessment and Plan   Treatment Plan For Obesity:  Recommended Dietary Goals  Martha Page is currently in the action stage of change. As such, her goal is to continue weight management plan. She has agreed to: continue current plan  Behavioral Health and Counseling  We discussed the following behavioral modification strategies today: continue to work on maintaining a reduced calorie state, getting the recommended amount of protein, incorporating whole foods, making healthy choices, staying  well hydrated and practicing mindfulness when eating. and increase protein intake, fibrous foods (25 grams per day for women, 30 grams for men) and water  to improve satiety and decrease hunger signals. .  Additional  education and resources provided today: None  Recommended Physical Activity Goals  Martha Page has been advised to work up to 150 minutes of moderate intensity aerobic activity a week and strengthening exercises 2-3 times per week for cardiovascular health, weight loss maintenance and preservation of muscle mass.  She has agreed to :  Increase volume of physical activity to a goal of 240 minutes a week and Combine aerobic and strengthening exercises for efficiency and improved cardiometabolic health.  Medical Interventions and Pharmacotherapy  We discussed various medication options to help Martha Page with her weight loss efforts and we both agreed to : Because of unsustained tachycardia and patient being on venlafaxine  I recommend reducing her phentermine  down to 18.75 mg.  She is avoiding caffeine and does not consume energy drinks.  She will continue to monitor her heart rate on her watch if heart rate remains above 100 medication will be further reduced to Lomaira  8 mg daily.  She has had a clinically significant response to sympathomimetics but if adrenergic symptoms persist medication will be discontinued.  We discussed possibly using GLP-1 next due to changes in cost and increase accessibility.  Associated Conditions Impacted by Obesity Treatment  Assessment & Plan Abnormal food appetite Improved on topiramate , phentermine .  She has neurological and gastrointestinal phenotype and benefits from ongoing pharmacotherapy.  See above discussion regarding phentermine  use. Generalized obesity - starting BMI 35 Obesity and medical weight management Significant weight loss of 58 pounds over the past year, with a recent loss of 24 pounds in two months. Current BMI is 27, and body fat percentage is 39%, with a goal of 36%. Muscle mass has decreased from 110-113 pounds to 96 pounds post-surgery. Discussed potential transition to a GLP-1 agonist due to sustained weight loss and potential insurance coverage  changes. - Continue current nutrition plan and exercise regimen. - Will consider transitioning to a GLP-1 agonist in January if insurance coverage changes. - Increase protein intake to 90-120 grams per day. - Focus on strengthening exercises to improve muscle mass. Prediabetes Most recent A1c is  Lab Results  Component Value Date   HGBA1C 5.6 06/21/2024   HGBA1C 5.3 10/16/2015    Patient aware of disease state and risk of progression. This may contribute to abnormal cravings, fatigue and diabetic complications without having diabetes.   We have discussed treatment options which include: losing 7 to 10% of body weight, increasing physical activity to a goal of 150 minutes a week at moderate intensity.  Advised to maintain a diet low on simple and processed carbohydrates.  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 Recent GI evaluation . Liver scan to be repeated in six months. Discussed potential need for liver elastography to assess for fibrosis - Continue current GI management plan. - Will repeat liver scan in six months. - Will consider liver elastography to assess for fibrosis. - Consider GLP-1 -Continue to maintain a diet low in saturated fats and simple and added sugars         Objective   Physical Exam:  Blood pressure 94/68, pulse 96, temperature 97.7 F (36.5 C), height 5' 6 (1.676 m), weight 168 lb (76.2 kg), SpO2 99%. Body mass index is 27.12 kg/m.  General: She is overweight, cooperative, alert,  well developed, and in no acute distress. PSYCH: Has normal mood, affect and thought process.   HEENT: EOMI, sclerae are anicteric. Lungs: Normal breathing effort, no conversational dyspnea. Extremities: No edema.  Neurologic: No gross sensory or motor deficits. No tremors or fasciculations noted.    Diagnostic Data Reviewed:  BMET    Component Value Date/Time    NA 137 06/21/2024 0000   NA 137 11/06/2023 1328   NA 137 05/26/2014 1855   K 4.0 06/21/2024 0000   K 3.3 (L) 05/26/2014 1855   CL 108 06/21/2024 0000   CL 105 05/26/2014 1855   CO2 21 06/21/2024 0000   CO2 20 (L) 05/26/2014 1855   GLUCOSE 79 06/21/2024 0000   GLUCOSE 131 (H) 05/26/2014 1855   BUN 13 06/21/2024 0000   BUN 13 11/06/2023 1328   BUN 5 (L) 05/26/2014 1855   CREATININE 0.71 06/21/2024 0000   CALCIUM  8.8 06/21/2024 0000   CALCIUM  8.4 (L) 05/26/2014 1855   GFRNONAA >60 01/24/2018 1543   GFRNONAA >60 05/26/2014 1855   GFRAA >60 01/24/2018 1543   GFRAA >60 05/26/2014 1855   Lab Results  Component Value Date   HGBA1C 5.6 06/21/2024   HGBA1C 5.3 10/16/2015   Lab Results  Component Value Date   INSULIN  11.0 10/25/2023   Lab Results  Component Value Date   TSH 1.36 06/21/2024   CBC    Component Value Date/Time   WBC 8.6 06/21/2024 0000   RBC 4.30 06/21/2024 0000   HGB 12.7 06/21/2024 0000   HGB 13.5 12/02/2021 1557   HCT 37.6 06/21/2024 0000   HCT 39.5 12/02/2021 1557   PLT 148 06/21/2024 0000   PLT 227 12/02/2021 1557   MCV 87.4 06/21/2024 0000   MCV 83 12/02/2021 1557   MCV 87 05/26/2014 1855   MCH 29.5 06/21/2024 0000   MCHC 33.8 06/21/2024 0000   RDW 13.4 06/21/2024 0000   RDW 13.5 12/02/2021 1557   RDW 18.2 (H) 05/26/2014 1855   Iron Studies    Component Value Date/Time   IRON 63 11/06/2023 1328   TIBC 276 11/06/2023 1328   FERRITIN 14 (L) 06/21/2024 0000   FERRITIN 14 (L) 11/06/2023 1328   IRONPCTSAT 23 11/06/2023 1328   Lipid Panel     Component Value Date/Time   CHOL 169 06/21/2024 0000   TRIG 114 06/21/2024 0000   HDL 47 (L) 06/21/2024 0000   CHOLHDL 3.6 06/21/2024 0000   VLDL 13.4 10/16/2015 0910   LDLCALC 101 (H) 06/21/2024 0000   Hepatic Function Panel     Component Value Date/Time   PROT 6.9 06/21/2024 0000   PROT 7.0 11/06/2023 1328   PROT 7.5 12/02/2013 1017   ALBUMIN 4.2 11/06/2023 1328   ALBUMIN 3.4 12/02/2013 1017    AST 29 06/21/2024 0000   AST 21 12/02/2013 1017   ALT 51 (H) 06/21/2024 0000   ALT 49 12/02/2013 1017   ALKPHOS 133 (H) 11/06/2023 1328   ALKPHOS 90 12/02/2013 1017   BILITOT 0.3 06/21/2024 0000   BILITOT 0.3 11/06/2023 1328   BILITOT 0.3 12/02/2013 1017   BILIDIR 0.1 01/27/2017 0844      Component Value Date/Time   TSH 1.36 06/21/2024 0000   Nutritional Lab Results  Component Value Date   VD25OH 44 06/21/2024   VD25OH 19.0 (L) 10/25/2023   VD25OH 27 (L) 08/24/2022    Medications: Outpatient Encounter Medications as of 10/24/2024  Medication Sig   cetirizine  (ZYRTEC ) 10 MG tablet TAKE 1 TABLET  BY MOUTH EVERY DAY   ibuprofen  (ADVIL ) 800 MG tablet TAKE 1 TABLET BY MOUTH EVERY 8 HOURS AS NEEDED   LINZESS  145 MCG CAPS capsule Take 1 capsule (145 mcg total) by mouth daily before breakfast. (Patient taking differently: Take 290 mcg by mouth daily before breakfast. Taking 290 mcg daily)   MIRENA, 52 MG, 20 MCG/24HR IUD    pantoprazole  (PROTONIX ) 40 MG tablet Take 1 tablet (40 mg total) by mouth 2 (two) times daily.   SUMAtriptan  (IMITREX ) 100 MG tablet TAKE 1 TABLET (100 MG TOTAL) BY MOUTH EVERY 2 (TWO) HOURS AS NEEDED FOR MIGRAINE. MAY REPEAT IN 2 HOURS IF HEADACHE PERSISTS OR RECURS.   traZODone  (DESYREL ) 50 MG tablet Take 1 tablet (50 mg total) by mouth at bedtime as needed.   venlafaxine  XR (EFFEXOR -XR) 150 MG 24 hr capsule Take 1 capsule (150 mg total) by mouth daily with breakfast.   [DISCONTINUED] metFORMIN  (GLUCOPHAGE -XR) 500 MG 24 hr tablet Take 1 tablet (500 mg total) by mouth daily with breakfast.   [DISCONTINUED] phentermine  (ADIPEX-P ) 37.5 MG tablet Take 1 tablet (37.5 mg total) by mouth daily before breakfast.   [DISCONTINUED] topiramate  (TOPAMAX ) 25 MG tablet Take 1 tablet (25 mg total) by mouth daily.   metFORMIN  (GLUCOPHAGE -XR) 500 MG 24 hr tablet Take 1 tablet (500 mg total) by mouth daily with breakfast.   phentermine  (ADIPEX-P ) 37.5 MG tablet Take 0.5 tablets  (18.75 mg total) by mouth daily before breakfast.   topiramate  (TOPAMAX ) 25 MG tablet Take 1 tablet (25 mg total) by mouth daily.   No facility-administered encounter medications on file as of 10/24/2024.     Follow-Up   Return in about 4 weeks (around 11/21/2024) for For Weight Mangement with Dr. Francyne.SABRA She was informed of the importance of frequent follow up visits to maximize her success with intensive lifestyle modifications for her multiple health conditions.  Attestation Statement   Reviewed by clinician on day of visit: allergies, medications, problem list, medical history, surgical history, family history, social history, and previous encounter notes.     Lucas Francyne, MD

## 2024-10-24 NOTE — Assessment & Plan Note (Signed)
 Most recent A1c is  Lab Results  Component Value Date   HGBA1C 5.6 06/21/2024   HGBA1C 5.3 10/16/2015    Patient aware of disease state and risk of progression. This may contribute to abnormal cravings, fatigue and diabetic complications without having diabetes.   We have discussed treatment options which include: losing 7 to 10% of body weight, increasing physical activity to a goal of 150 minutes a week at moderate intensity.  Advised to maintain a diet low on simple and processed carbohydrates.  Continue metformin  for pharmacoprevention

## 2024-10-24 NOTE — Assessment & Plan Note (Signed)
 Obesity and medical weight management Significant weight loss of 58 pounds over the past year, with a recent loss of 24 pounds in two months. Current BMI is 27, and body fat percentage is 39%, with a goal of 36%. Muscle mass has decreased from 110-113 pounds to 96 pounds post-surgery. Discussed potential transition to a GLP-1 agonist due to sustained weight loss and potential insurance coverage changes. - Continue current nutrition plan and exercise regimen. - Will consider transitioning to a GLP-1 agonist in January if insurance coverage changes. - Increase protein intake to 90-120 grams per day. - Focus on strengthening exercises to improve muscle mass.

## 2024-11-04 ENCOUNTER — Encounter (INDEPENDENT_AMBULATORY_CARE_PROVIDER_SITE_OTHER): Payer: Self-pay | Admitting: Internal Medicine

## 2024-11-15 ENCOUNTER — Ambulatory Visit: Admitting: Physician Assistant

## 2024-11-18 ENCOUNTER — Encounter: Payer: Self-pay | Admitting: *Deleted

## 2024-11-19 ENCOUNTER — Encounter (INDEPENDENT_AMBULATORY_CARE_PROVIDER_SITE_OTHER): Payer: Self-pay | Admitting: Internal Medicine

## 2024-11-19 ENCOUNTER — Ambulatory Visit (INDEPENDENT_AMBULATORY_CARE_PROVIDER_SITE_OTHER): Admitting: Internal Medicine

## 2024-11-19 VITALS — BP 92/66 | HR 94 | Temp 98.1°F | Ht 66.0 in | Wt 164.0 lb

## 2024-11-19 DIAGNOSIS — G4733 Obstructive sleep apnea (adult) (pediatric): Secondary | ICD-10-CM | POA: Diagnosis not present

## 2024-11-19 DIAGNOSIS — R7303 Prediabetes: Secondary | ICD-10-CM | POA: Diagnosis not present

## 2024-11-19 DIAGNOSIS — R638 Other symptoms and signs concerning food and fluid intake: Secondary | ICD-10-CM | POA: Diagnosis not present

## 2024-11-19 DIAGNOSIS — E669 Obesity, unspecified: Secondary | ICD-10-CM

## 2024-11-19 DIAGNOSIS — Z6826 Body mass index (BMI) 26.0-26.9, adult: Secondary | ICD-10-CM

## 2024-11-19 DIAGNOSIS — K760 Fatty (change of) liver, not elsewhere classified: Secondary | ICD-10-CM | POA: Diagnosis not present

## 2024-11-19 MED ORDER — PHENTERMINE HCL 37.5 MG PO TABS: 18.7500 mg | ORAL_TABLET | Freq: Every day | ORAL | 0 refills | Status: AC

## 2024-11-19 MED ORDER — METFORMIN HCL ER 500 MG PO TB24: 500.0000 mg | ORAL_TABLET | Freq: Two times a day (BID) | ORAL | Status: AC

## 2024-11-19 NOTE — Assessment & Plan Note (Addendum)
 MASLD with elevated ALT levels. Recent GI evaluation . Liver scan to be repeated in six months. Discussed potential need for liver elastography to assess for fibrosis - Continue current GI management plan. - Will repeat liver scan in six months. - Will consider liver elastography to assess for fibrosis. - Consider GLP-1 -Continue to maintain a diet low in saturated fats and simple and added sugars

## 2024-11-19 NOTE — Assessment & Plan Note (Signed)
 Difficulty with cravings for sweets, particularly during holidays. Currently on topiramate , which helps with cravings. Discussed options to increase topiramate  dosage or switch to extended-release formula. Metformin  dosage adjustment considered to help stabilize sugar cravings. She prefers to increase metformin  to twice daily after current supply is used. - Increase metformin  to twice daily after current supply is used. - Will consider switching to extended-release topiramate  (Trokendi ) after current supply is used. - Monitor for side effects such as sleepiness and fogginess with extended-release topiramate .

## 2024-11-19 NOTE — Assessment & Plan Note (Signed)
 Severe obstructive sleep apnea managed with CPAP. Significant weight loss may have impacted sleep apnea severity. Discussed potential for repeating sleep study due to weight loss and improved symptoms. She experiences dry mouth and congestion issues with CPAP use. - Discuss with sleep specialist about repeating sleep study due to significant weight loss. - Consider overnight oxygen test to assess need for CPAP.

## 2024-11-19 NOTE — Progress Notes (Signed)
 "  Office: [phone]  /  Fax: [phone]  Weight Summary and Body Composition Analysis (BIA)  Vitals Temp: 98.1 F (36.7 C) BP: 92/66 Pulse Rate: 94 SpO2: 100 %   Anthropometric Measurements Height: 5' 6 (1.676 m) Weight: 164 lb (74.4 kg) BMI (Calculated): 26.48 Weight at Last Visit: 168 lb Weight Lost Since Last Visit: 4 lb Weight Gained Since Last Visit: 0 lb Starting Weight: 217 lb Total Weight Loss (lbs): 53 lb (24 kg) Peak Weight: 226 lb   Body Composition  Body Fat %: 39 % Fat Mass (lbs): 64.2 lbs Muscle Mass (lbs): 95.4 lbs Total Body Water  (lbs): 65.4 lbs Visceral Fat Rating : 7    RMR: 1987  Today's Visit #: 14  Starting Date: 11/24/22   Subjective   Chief Complaint: Obesity  Interval History  Discussed the use of AI scribe software for clinical note transcription with the patient, who gave verbal consent to proceed.  History of Present Illness Martha Page is a 44 year old female who presents for medical weight management.  She has lost four pounds since her last visit and adheres to a 1200 calorie low-carb diet 80% of the time. She engages in physical activity four days a week for 20 minutes. She experiences cravings for sweets, particularly after receiving a bag of [employer] for Christmas.  Her current medications include metformin  once daily, which was reduced from twice daily due to illness, and phentermine , which was reduced due to her heart rate. She takes topiramate  at night and is considering increasing the dose to help with sweet cravings.  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 She has received injections in her knee and hip post-surgery, which have provided relief. She is attempting to incorporate more exercise into her routine.  She has lost 62 pounds over the past year and a month, with a current BMI of 26 and  body fat at 39%. Her goal is to reach a weight of 150 pounds. She is concerned about excess skin and muscle tone, particularly in her abdominal area, and is considering strengthening exercises to address this.  She has a history of severe sleep apnea and uses a CPAP machine. She reports difficulty using the machine due to congestion and dry mouth, and sometimes sleeps without it. No recent episodes of dozing off while driving.     Challenges affecting patient progress: orthopedic problems, medical conditions or chronic pain affecting mobility and medical comorbidities.    Pharmacotherapy for weight management: She is currently taking Metformin  (off label use for weight management and / or insulin  resistance and / or diabetes prevention) with adequate clinical response  and without side effects., Topiramate  (off label use, single agent) with adequate clinical response  and without side effects., and Phentermine  (longterm use, off-label, single agent)  with adequate clinical response  and without side effects..   Assessment and Plan   Treatment Plan For Obesity:  Recommended Dietary Goals  Elika is currently in the action stage of change. As such, her goal is to continue weight management plan. She has agreed to: incorporate 1-2 meal replacements a day for convenience  and continue current plan  Behavioral Health and Counseling  We discussed the following behavioral modification strategies today: continue to work on maintaining a reduced calorie state, getting the recommended amount of protein, incorporating whole foods, making healthy choices, staying well hydrated  and practicing mindfulness when eating. and increase protein intake, fibrous foods (25 grams per day for women, 30 grams for men) and water  to improve satiety and decrease hunger signals. .  Additional education and resources provided today: None  Recommended Physical Activity Goals  Kizzy has been advised to work up to 150  minutes of moderate intensity aerobic activity a week and strengthening exercises 2-3 times per week for cardiovascular health, weight loss maintenance and preservation of muscle mass.  She has agreed to :  Increase volume of physical activity to a goal of 240 minutes a week and Combine aerobic and strengthening exercises for efficiency and improved cardiometabolic health.  Medical Interventions and Pharmacotherapy  We discussed various medication options to help Maili with her weight loss efforts and we both agreed to : Adequate clinical response to anti-obesity medication, continue current anti-obesity regimen.  We will increase metformin  to twice a day to assist with sweet cravings  Associated Conditions Impacted by Obesity Treatment  Assessment & Plan Abnormal food appetite Difficulty with cravings for sweets, particularly during holidays. Currently on topiramate , which helps with cravings. Discussed options to increase topiramate  dosage or switch to extended-release formula. Metformin  dosage adjustment considered to help stabilize sugar cravings. She prefers to increase metformin  to twice daily after current supply is used. - Increase metformin  to twice daily after current supply is used. - Will consider switching to extended-release topiramate  (Trokendi ) after current supply is used. - Monitor for side effects such as sleepiness and fogginess with extended-release topiramate . Prediabetes Most recent A1c is  Lab Results  Component Value Date   HGBA1C 5.6 06/21/2024   HGBA1C 5.3 10/16/2015    Patient aware of disease state and risk of progression. This may contribute to abnormal cravings, fatigue and diabetic complications without having diabetes.   We have discussed treatment options which include: losing 7 to 10% of body weight, increasing physical activity to a goal of 150 minutes a week at moderate intensity.  Advised to maintain a diet low on simple and processed  carbohydrates.  Continue metformin  for pharmacoprevention  Generalized obesity - starting BMI 35  Weight: decrease of 62.3 lb (27.5%) over 1 year, 1 month  Start: 10/12/2023 226 lb 4.8 oz (102.6 kg)  End: 11/19/2024 164 lb (74.4 kg)  Significant weight loss of 62 pounds (27.5% over a year and a month) with current BMI at 26. Body fat percentage is 39%, with a goal of 36%. Muscle mass is stable. Fat mass is decreasing, and weight loss is expected to slow as fat mass approaches 60 pounds. Current weight loss strategy includes a 1200 calorie low-carb diet and exercise four days a week for 20 minutes. Discussed potential for maintenance phase once fat mass is under 60 pounds to prevent muscle loss. - Continue 1200 calorie low-carb diet and exercise regimen. - Focus on strengthening exercises and increasing protein intake to build lean muscle mass. - Consider maintenance phase once fat mass is under 60 pounds. OSA (obstructive sleep apnea) Severe obstructive sleep apnea managed with CPAP. Significant weight loss may have impacted sleep apnea severity. Discussed potential for repeating sleep study due to weight loss and improved symptoms. She experiences dry mouth and congestion issues with CPAP use. - Discuss with sleep specialist about repeating sleep study due to significant weight loss. - Consider overnight oxygen test to assess need for CPAP. 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 Recent GI evaluation . Liver scan to be repeated in six months.  Discussed potential need for liver elastography to assess for fibrosis - Continue current GI management plan. - Will repeat liver scan in six months. - Will consider liver elastography to assess for fibrosis. - Consider GLP-1 -Continue to maintain a diet low in saturated fats and simple and added sugars         Objective   Physical Exam:  Blood pressure 92/66, pulse 94, temperature 98.1 F  (36.7 C), height 5' 6 (1.676 m), weight 164 lb (74.4 kg), SpO2 100%. Body mass index is 26.47 kg/m.  General: She is overweight, cooperative, alert, well developed, and in no acute distress. PSYCH: Has normal mood, affect and thought process.   HEENT: EOMI, sclerae are anicteric. Lungs: Normal breathing effort, no conversational dyspnea. Extremities: No edema.  Neurologic: No gross sensory or motor deficits. No tremors or fasciculations noted.    Diagnostic Data Reviewed:  BMET    Component Value Date/Time   NA 137 06/21/2024 0000   NA 137 11/06/2023 1328   NA 137 05/26/2014 1855   K 4.0 06/21/2024 0000   K 3.3 (L) 05/26/2014 1855   CL 108 06/21/2024 0000   CL 105 05/26/2014 1855   CO2 21 06/21/2024 0000   CO2 20 (L) 05/26/2014 1855   GLUCOSE 79 06/21/2024 0000   GLUCOSE 131 (H) 05/26/2014 1855   BUN 13 06/21/2024 0000   BUN 13 11/06/2023 1328   BUN 5 (L) 05/26/2014 1855   CREATININE 0.71 06/21/2024 0000   CALCIUM  8.8 06/21/2024 0000   CALCIUM  8.4 (L) 05/26/2014 1855   GFRNONAA >60 01/24/2018 1543   GFRNONAA >60 05/26/2014 1855   GFRAA >60 01/24/2018 1543   GFRAA >60 05/26/2014 1855   Lab Results  Component Value Date   HGBA1C 5.6 06/21/2024   HGBA1C 5.3 10/16/2015   Lab Results  Component Value Date   INSULIN  11.0 10/25/2023   Lab Results  Component Value Date   TSH 1.36 06/21/2024   CBC    Component Value Date/Time   WBC 8.6 06/21/2024 0000   RBC 4.30 06/21/2024 0000   HGB 12.7 06/21/2024 0000   HGB 13.5 12/02/2021 1557   HCT 37.6 06/21/2024 0000   HCT 39.5 12/02/2021 1557   PLT 148 06/21/2024 0000   PLT 227 12/02/2021 1557   MCV 87.4 06/21/2024 0000   MCV 83 12/02/2021 1557   MCV 87 05/26/2014 1855   MCH 29.5 06/21/2024 0000   MCHC 33.8 06/21/2024 0000   RDW 13.4 06/21/2024 0000   RDW 13.5 12/02/2021 1557   RDW 18.2 (H) 05/26/2014 1855   Iron Studies    Component Value Date/Time   IRON 63 11/06/2023 1328   TIBC 276 11/06/2023 1328    FERRITIN 14 (L) 06/21/2024 0000   FERRITIN 14 (L) 11/06/2023 1328   IRONPCTSAT 23 11/06/2023 1328   Lipid Panel     Component Value Date/Time   CHOL 169 06/21/2024 0000   TRIG 114 06/21/2024 0000   HDL 47 (L) 06/21/2024 0000   CHOLHDL 3.6 06/21/2024 0000   VLDL 13.4 10/16/2015 0910   LDLCALC 101 (H) 06/21/2024 0000   Hepatic Function Panel     Component Value Date/Time   PROT 6.9 06/21/2024 0000   PROT 7.0 11/06/2023 1328   PROT 7.5 12/02/2013 1017   ALBUMIN 4.2 11/06/2023 1328   ALBUMIN 3.4 12/02/2013 1017   AST 29 06/21/2024 0000   AST 21 12/02/2013 1017   ALT 51 (H) 06/21/2024 0000   ALT 49 12/02/2013 1017  ALKPHOS 133 (H) 11/06/2023 1328   ALKPHOS 90 12/02/2013 1017   BILITOT 0.3 06/21/2024 0000   BILITOT 0.3 11/06/2023 1328   BILITOT 0.3 12/02/2013 1017   BILIDIR 0.1 01/27/2017 0844      Component Value Date/Time   TSH 1.36 06/21/2024 0000   Nutritional Lab Results  Component Value Date   VD25OH 44 06/21/2024   VD25OH 19.0 (L) 10/25/2023   VD25OH 27 (L) 08/24/2022    Medications: Outpatient Encounter Medications as of 11/19/2024  Medication Sig   cetirizine  (ZYRTEC ) 10 MG tablet TAKE 1 TABLET BY MOUTH EVERY DAY   ibuprofen  (ADVIL ) 800 MG tablet TAKE 1 TABLET BY MOUTH EVERY 8 HOURS AS NEEDED   LINZESS  145 MCG CAPS capsule Take 1 capsule (145 mcg total) by mouth daily before breakfast. (Patient taking differently: Take 290 mcg by mouth daily before breakfast. Taking 290 mcg daily)   MIRENA, 52 MG, 20 MCG/24HR IUD    pantoprazole  (PROTONIX ) 40 MG tablet Take 1 tablet (40 mg total) by mouth 2 (two) times daily.   SUMAtriptan  (IMITREX ) 100 MG tablet TAKE 1 TABLET (100 MG TOTAL) BY MOUTH EVERY 2 (TWO) HOURS AS NEEDED FOR MIGRAINE. MAY REPEAT IN 2 HOURS IF HEADACHE PERSISTS OR RECURS.   topiramate  (TOPAMAX ) 25 MG tablet Take 1 tablet (25 mg total) by mouth daily.   traZODone  (DESYREL ) 50 MG tablet Take 1 tablet (50 mg total) by mouth at bedtime as needed.    venlafaxine  XR (EFFEXOR -XR) 150 MG 24 hr capsule Take 1 capsule (150 mg total) by mouth daily with breakfast.   [DISCONTINUED] metFORMIN  (GLUCOPHAGE -XR) 500 MG 24 hr tablet Take 1 tablet (500 mg total) by mouth daily with breakfast.   [DISCONTINUED] phentermine  (ADIPEX-P ) 37.5 MG tablet Take 0.5 tablets (18.75 mg total) by mouth daily before breakfast.   metFORMIN  (GLUCOPHAGE -XR) 500 MG 24 hr tablet Take 1 tablet (500 mg total) by mouth 2 (two) times daily with a meal.   phentermine  (ADIPEX-P ) 37.5 MG tablet Take 0.5 tablets (18.75 mg total) by mouth daily before breakfast.   No facility-administered encounter medications on file as of 11/19/2024.     Follow-Up   Return in about 6 weeks (around 12/31/2024) for For Weight Mangement with Dr. Francyne.SABRA She was informed of the importance of frequent follow up visits to maximize her success with intensive lifestyle modifications for her multiple health conditions.  Attestation Statement   Reviewed by clinician on day of visit: allergies, medications, problem list, medical history, surgical history, family history, social history, and previous encounter notes.     Lucas Francyne, MD  "

## 2024-11-19 NOTE — Assessment & Plan Note (Signed)
"   Weight: decrease of 62.3 lb (27.5%) over 1 year, 1 month  Start: 10/12/2023 226 lb 4.8 oz (102.6 kg)  End: 11/19/2024 164 lb (74.4 kg)  Significant weight loss of 62 pounds (27.5% over a year and a month) with current BMI at 26. Body fat percentage is 39%, with a goal of 36%. Muscle mass is stable. Fat mass is decreasing, and weight loss is expected to slow as fat mass approaches 60 pounds. Current weight loss strategy includes a 1200 calorie low-carb diet and exercise four days a week for 20 minutes. Discussed potential for maintenance phase once fat mass is under 60 pounds to prevent muscle loss. - Continue 1200 calorie low-carb diet and exercise regimen. - Focus on strengthening exercises and increasing protein intake to build lean muscle mass. - Consider maintenance phase once fat mass is under 60 pounds. "

## 2024-11-19 NOTE — Assessment & Plan Note (Signed)
 Most recent A1c is  Lab Results  Component Value Date   HGBA1C 5.6 06/21/2024   HGBA1C 5.3 10/16/2015    Patient aware of disease state and risk of progression. This may contribute to abnormal cravings, fatigue and diabetic complications without having diabetes.   We have discussed treatment options which include: losing 7 to 10% of body weight, increasing physical activity to a goal of 150 minutes a week at moderate intensity.  Advised to maintain a diet low on simple and processed carbohydrates.  Continue metformin  for pharmacoprevention

## 2024-12-03 ENCOUNTER — Ambulatory Visit: Admitting: Dermatology

## 2024-12-17 ENCOUNTER — Ambulatory Visit (INDEPENDENT_AMBULATORY_CARE_PROVIDER_SITE_OTHER): Admitting: Internal Medicine

## 2024-12-23 ENCOUNTER — Encounter: Admitting: Internal Medicine

## 2025-01-06 ENCOUNTER — Ambulatory Visit (INDEPENDENT_AMBULATORY_CARE_PROVIDER_SITE_OTHER): Admitting: Internal Medicine

## 2025-02-17 ENCOUNTER — Ambulatory Visit (INDEPENDENT_AMBULATORY_CARE_PROVIDER_SITE_OTHER): Admitting: Internal Medicine
